# Patient Record
Sex: Male | Born: 1940 | Race: Asian | Hispanic: No | Marital: Married | Smoking: Never smoker
Health system: Southern US, Community
[De-identification: ages and names within clinical notes are randomized; demographics above are authoritative.]

## PROBLEM LIST (undated history)

## (undated) DIAGNOSIS — J189 Pneumonia, unspecified organism: Secondary | ICD-10-CM

## (undated) DIAGNOSIS — E119 Type 2 diabetes mellitus without complications: Secondary | ICD-10-CM

## (undated) DIAGNOSIS — I2510 Atherosclerotic heart disease of native coronary artery without angina pectoris: Secondary | ICD-10-CM

## (undated) DIAGNOSIS — Z86010 Personal history of colonic polyps: Secondary | ICD-10-CM

## (undated) DIAGNOSIS — E785 Hyperlipidemia, unspecified: Secondary | ICD-10-CM

## (undated) DIAGNOSIS — I10 Essential (primary) hypertension: Secondary | ICD-10-CM

## (undated) DIAGNOSIS — Z91041 Radiographic dye allergy status: Secondary | ICD-10-CM

## (undated) DIAGNOSIS — K219 Gastro-esophageal reflux disease without esophagitis: Secondary | ICD-10-CM

## (undated) HISTORY — DX: Gastro-esophageal reflux disease without esophagitis: K21.9

## (undated) HISTORY — PX: CORONARY ANGIOPLASTY: SHX604

## (undated) HISTORY — PX: SHOULDER OPEN ROTATOR CUFF REPAIR: SHX2407

## (undated) HISTORY — DX: Essential (primary) hypertension: I10

## (undated) HISTORY — PX: SHOULDER ARTHROSCOPY W/ ROTATOR CUFF REPAIR: SHX2400

## (undated) HISTORY — DX: Personal history of colonic polyps: Z86.010

## (undated) HISTORY — DX: Hyperlipidemia, unspecified: E78.5

---

## 1999-07-28 ENCOUNTER — Encounter: Payer: Self-pay | Admitting: Family Medicine

## 1999-07-28 ENCOUNTER — Ambulatory Visit (HOSPITAL_COMMUNITY): Admission: RE | Admit: 1999-07-28 | Discharge: 1999-07-28 | Payer: Self-pay | Admitting: Gastroenterology

## 2000-12-18 ENCOUNTER — Encounter: Payer: Self-pay | Admitting: Family Medicine

## 2000-12-18 ENCOUNTER — Encounter: Admission: RE | Admit: 2000-12-18 | Discharge: 2000-12-18 | Payer: Self-pay | Admitting: Family Medicine

## 2002-05-22 ENCOUNTER — Emergency Department (HOSPITAL_COMMUNITY): Admission: EM | Admit: 2002-05-22 | Discharge: 2002-05-22 | Payer: Self-pay | Admitting: Emergency Medicine

## 2003-01-30 HISTORY — PX: CATARACT EXTRACTION W/ INTRAOCULAR LENS  IMPLANT, BILATERAL: SHX1307

## 2003-02-24 ENCOUNTER — Emergency Department (HOSPITAL_COMMUNITY): Admission: EM | Admit: 2003-02-24 | Discharge: 2003-02-24 | Payer: Self-pay | Admitting: Emergency Medicine

## 2005-08-22 ENCOUNTER — Ambulatory Visit: Payer: Self-pay | Admitting: Family Medicine

## 2005-10-19 ENCOUNTER — Ambulatory Visit: Payer: Self-pay | Admitting: Family Medicine

## 2006-04-15 DIAGNOSIS — IMO0001 Reserved for inherently not codable concepts without codable children: Secondary | ICD-10-CM | POA: Insufficient documentation

## 2006-04-15 DIAGNOSIS — E1165 Type 2 diabetes mellitus with hyperglycemia: Secondary | ICD-10-CM

## 2006-04-15 DIAGNOSIS — E785 Hyperlipidemia, unspecified: Secondary | ICD-10-CM | POA: Insufficient documentation

## 2006-04-15 DIAGNOSIS — I10 Essential (primary) hypertension: Secondary | ICD-10-CM | POA: Insufficient documentation

## 2006-04-15 DIAGNOSIS — K219 Gastro-esophageal reflux disease without esophagitis: Secondary | ICD-10-CM | POA: Insufficient documentation

## 2006-05-22 ENCOUNTER — Ambulatory Visit: Payer: Self-pay | Admitting: Family Medicine

## 2006-05-22 LAB — CONVERTED CEMR LAB
BUN: 12 mg/dL (ref 6–23)
CO2: 30 meq/L (ref 19–32)
Calcium: 9.5 mg/dL (ref 8.4–10.5)
Chloride: 105 meq/L (ref 96–112)
Creatinine, Ser: 0.7 mg/dL (ref 0.4–1.5)
GFR calc Af Amer: 146 mL/min
GFR calc non Af Amer: 121 mL/min
Glucose, Bld: 92 mg/dL (ref 70–99)
Potassium: 4.1 meq/L (ref 3.5–5.1)
Sodium: 139 meq/L (ref 135–145)

## 2006-07-29 ENCOUNTER — Ambulatory Visit: Payer: Self-pay | Admitting: Family Medicine

## 2006-07-30 LAB — CONVERTED CEMR LAB
ALT: 20 units/L (ref 0–53)
AST: 22 units/L (ref 0–37)
BUN: 11 mg/dL (ref 6–23)
CO2: 29 meq/L (ref 19–32)
Calcium: 9.4 mg/dL (ref 8.4–10.5)
Chloride: 105 meq/L (ref 96–112)
Cholesterol: 98 mg/dL (ref 0–200)
Creatinine, Ser: 0.8 mg/dL (ref 0.4–1.5)
Creatinine,U: 210.5 mg/dL
GFR calc Af Amer: 125 mL/min
GFR calc non Af Amer: 103 mL/min
Glucose, Bld: 103 mg/dL — ABNORMAL HIGH (ref 70–99)
HDL: 25.7 mg/dL — ABNORMAL LOW (ref >39.0)
Hgb A1c MFr Bld: 6.2 % — ABNORMAL HIGH (ref 4.6–6.0)
LDL Cholesterol: 55 mg/dL (ref 0–99)
Microalb Creat Ratio: 10 mg/g (ref 0.0–30.0)
Microalb, Ur: 2.1 mg/dL — ABNORMAL HIGH (ref 0.0–1.9)
Potassium: 4.7 meq/L (ref 3.5–5.1)
Sodium: 140 meq/L (ref 135–145)
Total CHOL/HDL Ratio: 3.8
Triglycerides: 85 mg/dL (ref 0–149)
VLDL: 17 mg/dL (ref 0–40)

## 2006-07-31 ENCOUNTER — Encounter (INDEPENDENT_AMBULATORY_CARE_PROVIDER_SITE_OTHER): Payer: Self-pay | Admitting: Family Medicine

## 2006-07-31 ENCOUNTER — Encounter (INDEPENDENT_AMBULATORY_CARE_PROVIDER_SITE_OTHER): Payer: Self-pay | Admitting: *Deleted

## 2006-09-26 ENCOUNTER — Encounter (INDEPENDENT_AMBULATORY_CARE_PROVIDER_SITE_OTHER): Payer: Self-pay | Admitting: Family Medicine

## 2006-10-15 ENCOUNTER — Telehealth (INDEPENDENT_AMBULATORY_CARE_PROVIDER_SITE_OTHER): Payer: Self-pay | Admitting: *Deleted

## 2006-10-24 ENCOUNTER — Ambulatory Visit: Payer: Self-pay | Admitting: Family Medicine

## 2006-10-28 ENCOUNTER — Telehealth (INDEPENDENT_AMBULATORY_CARE_PROVIDER_SITE_OTHER): Payer: Self-pay | Admitting: *Deleted

## 2006-10-28 ENCOUNTER — Encounter (INDEPENDENT_AMBULATORY_CARE_PROVIDER_SITE_OTHER): Payer: Self-pay | Admitting: Family Medicine

## 2006-10-28 LAB — CONVERTED CEMR LAB
ALT: 24 units/L (ref 0–53)
AST: 26 units/L (ref 0–37)
BUN: 10 mg/dL (ref 6–23)
CO2: 30 meq/L (ref 19–32)
Calcium: 9.5 mg/dL (ref 8.4–10.5)
Chloride: 102 meq/L (ref 96–112)
Cholesterol: 99 mg/dL (ref 0–200)
Creatinine, Ser: 0.8 mg/dL (ref 0.4–1.5)
Creatinine,U: 211.8 mg/dL
GFR calc Af Amer: 125 mL/min
GFR calc non Af Amer: 103 mL/min
Glucose, Bld: 98 mg/dL (ref 70–99)
HDL: 28.7 mg/dL — ABNORMAL LOW (ref >39.0)
Hgb A1c MFr Bld: 6.3 % — ABNORMAL HIGH (ref 4.6–6.0)
LDL Cholesterol: 55 mg/dL (ref 0–99)
Microalb Creat Ratio: 4.7 mg/g (ref 0.0–30.0)
Microalb, Ur: 1 mg/dL (ref 0.0–1.9)
Potassium: 4.3 meq/L (ref 3.5–5.1)
Sodium: 138 meq/L (ref 135–145)
Total CHOL/HDL Ratio: 3.4
Triglycerides: 77 mg/dL (ref 0–149)
VLDL: 15 mg/dL (ref 0–40)

## 2006-10-29 ENCOUNTER — Encounter: Payer: Self-pay | Admitting: Family Medicine

## 2006-10-30 ENCOUNTER — Telehealth (INDEPENDENT_AMBULATORY_CARE_PROVIDER_SITE_OTHER): Payer: Self-pay | Admitting: *Deleted

## 2007-04-08 ENCOUNTER — Telehealth (INDEPENDENT_AMBULATORY_CARE_PROVIDER_SITE_OTHER): Payer: Self-pay | Admitting: Family Medicine

## 2007-04-22 ENCOUNTER — Encounter (INDEPENDENT_AMBULATORY_CARE_PROVIDER_SITE_OTHER): Payer: Self-pay | Admitting: Family Medicine

## 2007-05-13 ENCOUNTER — Ambulatory Visit: Payer: Self-pay | Admitting: Family Medicine

## 2007-05-13 LAB — CONVERTED CEMR LAB
ALT: 28 units/L (ref 0–53)
AST: 24 units/L (ref 0–37)
Albumin: 4.1 g/dL (ref 3.5–5.2)
Alkaline Phosphatase: 52 units/L (ref 39–117)
BUN: 8 mg/dL (ref 6–23)
Bilirubin, Direct: 0.1 mg/dL (ref 0.0–0.3)
CO2: 29 meq/L (ref 19–32)
Calcium: 9.6 mg/dL (ref 8.4–10.5)
Chloride: 104 meq/L (ref 96–112)
Cholesterol: 88 mg/dL (ref 0–200)
Creatinine, Ser: 0.8 mg/dL (ref 0.4–1.5)
Creatinine,U: 121.7 mg/dL
GFR calc Af Amer: 125 mL/min
GFR calc non Af Amer: 103 mL/min
Glucose, Bld: 131 mg/dL — ABNORMAL HIGH (ref 70–99)
HDL: 22.7 mg/dL — ABNORMAL LOW (ref >39.0)
Hgb A1c MFr Bld: 6.6 % — ABNORMAL HIGH (ref 4.6–6.0)
LDL Cholesterol: 49 mg/dL (ref 0–99)
Microalb Creat Ratio: 8.2 mg/g (ref 0.0–30.0)
Microalb, Ur: 1 mg/dL (ref 0.0–1.9)
Potassium: 4.7 meq/L (ref 3.5–5.1)
Sodium: 139 meq/L (ref 135–145)
Total Bilirubin: 0.7 mg/dL (ref 0.3–1.2)
Total CHOL/HDL Ratio: 3.9
Total Protein: 7.4 g/dL (ref 6.0–8.3)
Triglycerides: 83 mg/dL (ref 0–149)
VLDL: 17 mg/dL (ref 0–40)

## 2007-05-14 ENCOUNTER — Encounter (INDEPENDENT_AMBULATORY_CARE_PROVIDER_SITE_OTHER): Payer: Self-pay | Admitting: *Deleted

## 2007-05-15 ENCOUNTER — Telehealth (INDEPENDENT_AMBULATORY_CARE_PROVIDER_SITE_OTHER): Payer: Self-pay | Admitting: *Deleted

## 2007-06-03 ENCOUNTER — Ambulatory Visit: Payer: Self-pay | Admitting: Endocrinology

## 2007-06-03 ENCOUNTER — Telehealth (INDEPENDENT_AMBULATORY_CARE_PROVIDER_SITE_OTHER): Payer: Self-pay | Admitting: *Deleted

## 2007-06-03 DIAGNOSIS — J069 Acute upper respiratory infection, unspecified: Secondary | ICD-10-CM | POA: Insufficient documentation

## 2007-06-24 ENCOUNTER — Encounter: Payer: Self-pay | Admitting: Internal Medicine

## 2007-09-16 ENCOUNTER — Ambulatory Visit: Payer: Self-pay | Admitting: Family Medicine

## 2007-09-16 LAB — CONVERTED CEMR LAB
Bilirubin Urine: NEGATIVE
Glucose, Urine, Semiquant: NEGATIVE
Ketones, urine, test strip: NEGATIVE
Nitrite: NEGATIVE
Protein, U semiquant: NEGATIVE
Specific Gravity, Urine: >=1.03
Urobilinogen, UA: NEGATIVE
WBC Urine, dipstick: NEGATIVE
pH: 5

## 2007-09-18 ENCOUNTER — Encounter (INDEPENDENT_AMBULATORY_CARE_PROVIDER_SITE_OTHER): Payer: Self-pay | Admitting: *Deleted

## 2007-09-28 LAB — CONVERTED CEMR LAB
ALT: 25 units/L (ref 0–53)
AST: 24 units/L (ref 0–37)
Albumin: 4 g/dL (ref 3.5–5.2)
Alkaline Phosphatase: 50 units/L (ref 39–117)
BUN: 12 mg/dL (ref 6–23)
Bilirubin, Direct: 0.1 mg/dL (ref 0.0–0.3)
CO2: 29 meq/L (ref 19–32)
Calcium: 9.8 mg/dL (ref 8.4–10.5)
Chloride: 101 meq/L (ref 96–112)
Cholesterol: 109 mg/dL (ref 0–200)
Creatinine, Ser: 0.9 mg/dL (ref 0.4–1.5)
Creatinine,U: 270.8 mg/dL
GFR calc Af Amer: 109 mL/min
GFR calc non Af Amer: 90 mL/min
Glucose, Bld: 110 mg/dL — ABNORMAL HIGH (ref 70–99)
HDL: 27 mg/dL — ABNORMAL LOW (ref >39.0)
Hgb A1c MFr Bld: 6.6 % — ABNORMAL HIGH (ref 4.6–6.0)
LDL Cholesterol: 62 mg/dL (ref 0–99)
Microalb Creat Ratio: 5.2 mg/g (ref 0.0–30.0)
Microalb, Ur: 1.4 mg/dL (ref 0.0–1.9)
PSA: 4.09 ng/mL — ABNORMAL HIGH (ref 0.10–4.00)
Potassium: 4.7 meq/L (ref 3.5–5.1)
Sodium: 137 meq/L (ref 135–145)
Total Bilirubin: 0.8 mg/dL (ref 0.3–1.2)
Total CHOL/HDL Ratio: 4
Total Protein: 7.1 g/dL (ref 6.0–8.3)
Triglycerides: 102 mg/dL (ref 0–149)
VLDL: 20 mg/dL (ref 0–40)

## 2007-09-29 ENCOUNTER — Telehealth (INDEPENDENT_AMBULATORY_CARE_PROVIDER_SITE_OTHER): Payer: Self-pay | Admitting: *Deleted

## 2007-09-30 ENCOUNTER — Encounter (INDEPENDENT_AMBULATORY_CARE_PROVIDER_SITE_OTHER): Payer: Self-pay | Admitting: *Deleted

## 2007-10-10 ENCOUNTER — Encounter: Payer: Self-pay | Admitting: Family Medicine

## 2007-11-10 ENCOUNTER — Telehealth (INDEPENDENT_AMBULATORY_CARE_PROVIDER_SITE_OTHER): Payer: Self-pay | Admitting: *Deleted

## 2007-11-13 ENCOUNTER — Encounter: Payer: Self-pay | Admitting: Family Medicine

## 2008-04-07 LAB — HM DIABETES EYE EXAM: HM Diabetic Eye Exam: NORMAL

## 2008-04-09 ENCOUNTER — Ambulatory Visit: Payer: Self-pay | Admitting: Family Medicine

## 2008-04-26 ENCOUNTER — Encounter (INDEPENDENT_AMBULATORY_CARE_PROVIDER_SITE_OTHER): Payer: Self-pay | Admitting: *Deleted

## 2008-04-26 LAB — CONVERTED CEMR LAB
ALT: 25 units/L (ref 0–53)
AST: 22 units/L (ref 0–37)
Albumin: 3.9 g/dL (ref 3.5–5.2)
Alkaline Phosphatase: 49 units/L (ref 39–117)
BUN: 12 mg/dL (ref 6–23)
Basophils Absolute: 0 10*3/uL (ref 0.0–0.1)
Basophils Relative: 0.1 % (ref 0.0–3.0)
Bilirubin, Direct: 0.1 mg/dL (ref 0.0–0.3)
CO2: 28 meq/L (ref 19–32)
Calcium: 9.1 mg/dL (ref 8.4–10.5)
Chloride: 102 meq/L (ref 96–112)
Cholesterol: 103 mg/dL (ref 0–200)
Creatinine, Ser: 0.8 mg/dL (ref 0.4–1.5)
Creatinine,U: 200 mg/dL
Eosinophils Absolute: 0.3 10*3/uL (ref 0.0–0.7)
Eosinophils Relative: 3.1 % (ref 0.0–5.0)
GFR calc Af Amer: 125 mL/min
GFR calc non Af Amer: 103 mL/min
Glucose, Bld: 109 mg/dL — ABNORMAL HIGH (ref 70–99)
HCT: 39.1 % (ref 39.0–52.0)
HDL: 24.5 mg/dL — ABNORMAL LOW (ref >39.0)
Hemoglobin: 13.2 g/dL (ref 13.0–17.0)
Hgb A1c MFr Bld: 6.7 % — ABNORMAL HIGH (ref 4.6–6.0)
LDL Cholesterol: 61 mg/dL (ref 0–99)
Lymphocytes Relative: 34.4 % (ref 12.0–46.0)
MCHC: 33.8 g/dL (ref 30.0–36.0)
MCV: 87.8 fL (ref 78.0–100.0)
Microalb Creat Ratio: 6.5 mg/g (ref 0.0–30.0)
Microalb, Ur: 1.3 mg/dL (ref 0.0–1.9)
Monocytes Absolute: 0.7 10*3/uL (ref 0.1–1.0)
Monocytes Relative: 7.9 % (ref 3.0–12.0)
Neutro Abs: 4.5 10*3/uL (ref 1.4–7.7)
Neutrophils Relative %: 54.5 % (ref 43.0–77.0)
Platelets: 178 10*3/uL (ref 150–400)
Potassium: 4.6 meq/L (ref 3.5–5.1)
RBC: 4.46 M/uL (ref 4.22–5.81)
RDW: 12.5 % (ref 11.5–14.6)
Sodium: 138 meq/L (ref 135–145)
Total Bilirubin: 0.8 mg/dL (ref 0.3–1.2)
Total CHOL/HDL Ratio: 4.2
Total Protein: 6.9 g/dL (ref 6.0–8.3)
Triglycerides: 90 mg/dL (ref 0–149)
VLDL: 18 mg/dL (ref 0–40)
WBC: 8.4 10*3/uL (ref 4.5–10.5)

## 2008-10-01 ENCOUNTER — Telehealth (INDEPENDENT_AMBULATORY_CARE_PROVIDER_SITE_OTHER): Payer: Self-pay | Admitting: *Deleted

## 2008-11-12 ENCOUNTER — Ambulatory Visit: Payer: Self-pay | Admitting: Family Medicine

## 2008-11-12 ENCOUNTER — Telehealth: Payer: Self-pay | Admitting: Family Medicine

## 2008-11-12 LAB — CONVERTED CEMR LAB
Albumin: 4 g/dL (ref 3.5–5.2)
Bilirubin Urine: NEGATIVE
CO2: 27 meq/L (ref 19–32)
Calcium: 9.3 mg/dL (ref 8.4–10.5)
Chloride: 99 meq/L (ref 96–112)
Glucose, Urine, Semiquant: NEGATIVE
HDL: 23.6 mg/dL — ABNORMAL LOW (ref >39.00)
Ketones, urine, test strip: NEGATIVE
LDL Cholesterol: 61 mg/dL (ref 0–99)
Nitrite: NEGATIVE
Protein, U semiquant: NEGATIVE
Sodium: 133 meq/L — ABNORMAL LOW (ref 135–145)
Specific Gravity, Urine: 1.02
Total CHOL/HDL Ratio: 5
Triglycerides: 142 mg/dL (ref 0.0–149.0)
Urobilinogen, UA: 0.2
WBC Urine, dipstick: NEGATIVE
pH: 6

## 2008-11-13 ENCOUNTER — Encounter: Payer: Self-pay | Admitting: Family Medicine

## 2008-11-15 ENCOUNTER — Encounter: Payer: Self-pay | Admitting: Family Medicine

## 2008-11-18 ENCOUNTER — Encounter: Payer: Self-pay | Admitting: Family Medicine

## 2009-01-19 ENCOUNTER — Telehealth: Payer: Self-pay | Admitting: Family Medicine

## 2009-01-27 ENCOUNTER — Ambulatory Visit: Payer: Self-pay | Admitting: Family Medicine

## 2009-01-27 DIAGNOSIS — R059 Cough, unspecified: Secondary | ICD-10-CM | POA: Insufficient documentation

## 2009-01-27 DIAGNOSIS — J019 Acute sinusitis, unspecified: Secondary | ICD-10-CM | POA: Insufficient documentation

## 2009-01-27 DIAGNOSIS — J029 Acute pharyngitis, unspecified: Secondary | ICD-10-CM | POA: Insufficient documentation

## 2009-01-27 DIAGNOSIS — R05 Cough: Secondary | ICD-10-CM | POA: Insufficient documentation

## 2009-01-31 ENCOUNTER — Encounter (INDEPENDENT_AMBULATORY_CARE_PROVIDER_SITE_OTHER): Payer: Self-pay | Admitting: *Deleted

## 2009-01-31 ENCOUNTER — Telehealth (INDEPENDENT_AMBULATORY_CARE_PROVIDER_SITE_OTHER): Payer: Self-pay | Admitting: *Deleted

## 2009-01-31 LAB — CONVERTED CEMR LAB
Basophils Relative: 0.3 % (ref 0.0–3.0)
Bilirubin, Direct: 0.2 mg/dL (ref 0.0–0.3)
Chloride: 100 meq/L (ref 96–112)
Creatinine, Ser: 0.7 mg/dL (ref 0.4–1.5)
Creatinine,U: 115.1 mg/dL
Eosinophils Relative: 3.9 % (ref 0.0–5.0)
GFR calc non Af Amer: 119.66 mL/min (ref >60)
Hgb A1c MFr Bld: 7.1 % — ABNORMAL HIGH (ref 4.6–6.5)
LDL Cholesterol: 55 mg/dL (ref 0–99)
Lymphocytes Relative: 28.8 % (ref 12.0–46.0)
MCV: 86.6 fL (ref 78.0–100.0)
Microalb Creat Ratio: 5.2 mg/g (ref 0.0–30.0)
Microalb, Ur: 0.6 mg/dL (ref 0.0–1.9)
Monocytes Absolute: 0.8 10*3/uL (ref 0.1–1.0)
Monocytes Relative: 6.6 % (ref 3.0–12.0)
Neutrophils Relative %: 60.4 % (ref 43.0–77.0)
RBC: 4.74 M/uL (ref 4.22–5.81)
Total Bilirubin: 1 mg/dL (ref 0.3–1.2)
Total CHOL/HDL Ratio: 4
Triglycerides: 127 mg/dL (ref 0.0–149.0)
VLDL: 25.4 mg/dL (ref 0.0–40.0)
WBC: 12.4 10*3/uL — ABNORMAL HIGH (ref 4.5–10.5)

## 2009-02-07 ENCOUNTER — Telehealth: Payer: Self-pay | Admitting: Family Medicine

## 2009-02-09 ENCOUNTER — Telehealth: Payer: Self-pay | Admitting: Family Medicine

## 2009-03-08 ENCOUNTER — Telehealth: Payer: Self-pay | Admitting: Family Medicine

## 2009-03-14 ENCOUNTER — Telehealth (INDEPENDENT_AMBULATORY_CARE_PROVIDER_SITE_OTHER): Payer: Self-pay | Admitting: *Deleted

## 2009-03-18 ENCOUNTER — Ambulatory Visit: Payer: Self-pay | Admitting: Family Medicine

## 2009-03-18 ENCOUNTER — Encounter: Payer: Self-pay | Admitting: Family Medicine

## 2009-03-18 DIAGNOSIS — R972 Elevated prostate specific antigen [PSA]: Secondary | ICD-10-CM | POA: Insufficient documentation

## 2009-03-21 LAB — CONVERTED CEMR LAB
ALT: 26 units/L (ref 0–53)
AST: 23 units/L (ref 0–37)
Albumin: 4 g/dL (ref 3.5–5.2)
BUN: 12 mg/dL (ref 6–23)
Cholesterol: 97 mg/dL (ref 0–200)
Creatinine, Ser: 0.7 mg/dL (ref 0.4–1.5)
Creatinine,U: 342.3 mg/dL
GFR calc non Af Amer: 119.61 mL/min (ref >60)
Glucose, Bld: 122 mg/dL — ABNORMAL HIGH (ref 70–99)
HDL: 30.3 mg/dL — ABNORMAL LOW (ref >39.00)
LDL Cholesterol: 49 mg/dL (ref 0–99)
Microalb, Ur: 4.1 mg/dL — ABNORMAL HIGH (ref 0.0–1.9)
Total Bilirubin: 0.5 mg/dL (ref 0.3–1.2)
Total CHOL/HDL Ratio: 3
Triglycerides: 89 mg/dL (ref 0.0–149.0)

## 2009-03-30 ENCOUNTER — Telehealth: Payer: Self-pay | Admitting: Family Medicine

## 2009-03-30 ENCOUNTER — Encounter: Payer: Self-pay | Admitting: Family Medicine

## 2009-04-04 ENCOUNTER — Encounter: Payer: Self-pay | Admitting: Family Medicine

## 2009-04-05 ENCOUNTER — Encounter: Payer: Self-pay | Admitting: Family Medicine

## 2009-04-05 ENCOUNTER — Telehealth: Payer: Self-pay | Admitting: Family Medicine

## 2009-04-11 ENCOUNTER — Telehealth: Payer: Self-pay | Admitting: Family Medicine

## 2009-04-13 ENCOUNTER — Ambulatory Visit: Payer: Self-pay | Admitting: Family Medicine

## 2009-04-13 DIAGNOSIS — J189 Pneumonia, unspecified organism: Secondary | ICD-10-CM | POA: Insufficient documentation

## 2009-04-13 DIAGNOSIS — R930 Abnormal findings on diagnostic imaging of skull and head, not elsewhere classified: Secondary | ICD-10-CM | POA: Insufficient documentation

## 2009-04-22 ENCOUNTER — Ambulatory Visit: Payer: Self-pay | Admitting: Family Medicine

## 2009-04-26 ENCOUNTER — Telehealth (INDEPENDENT_AMBULATORY_CARE_PROVIDER_SITE_OTHER): Payer: Self-pay | Admitting: *Deleted

## 2009-04-27 ENCOUNTER — Encounter: Payer: Self-pay | Admitting: Family Medicine

## 2009-05-13 ENCOUNTER — Encounter: Payer: Self-pay | Admitting: Family Medicine

## 2009-05-18 ENCOUNTER — Encounter: Payer: Self-pay | Admitting: Family Medicine

## 2009-07-25 ENCOUNTER — Ambulatory Visit: Payer: Self-pay | Admitting: Internal Medicine

## 2009-07-25 DIAGNOSIS — L259 Unspecified contact dermatitis, unspecified cause: Secondary | ICD-10-CM | POA: Insufficient documentation

## 2009-07-28 LAB — CONVERTED CEMR LAB
ALT: 17 units/L (ref 0–53)
AST: 21 units/L (ref 0–37)
Bilirubin, Direct: 0.2 mg/dL (ref 0.0–0.3)
CO2: 29 meq/L (ref 19–32)
Chloride: 103 meq/L (ref 96–112)
Cholesterol: 97 mg/dL (ref 0–200)
Creatinine, Ser: 0.8 mg/dL (ref 0.4–1.5)
LDL Cholesterol: 50 mg/dL (ref 0–99)
Microalb, Ur: 2.8 mg/dL — ABNORMAL HIGH (ref 0.0–1.9)
Potassium: 4.5 meq/L (ref 3.5–5.1)
Total Bilirubin: 0.9 mg/dL (ref 0.3–1.2)
Total CHOL/HDL Ratio: 3
Total Protein: 7.3 g/dL (ref 6.0–8.3)
Triglycerides: 86 mg/dL (ref 0.0–149.0)

## 2009-08-01 ENCOUNTER — Encounter: Payer: Self-pay | Admitting: Family Medicine

## 2009-08-02 ENCOUNTER — Encounter: Payer: Self-pay | Admitting: Family Medicine

## 2009-08-03 ENCOUNTER — Telehealth: Payer: Self-pay | Admitting: Family Medicine

## 2009-08-04 LAB — CONVERTED CEMR LAB

## 2009-09-12 ENCOUNTER — Encounter: Payer: Self-pay | Admitting: Family Medicine

## 2009-11-18 ENCOUNTER — Ambulatory Visit: Payer: Self-pay | Admitting: Family Medicine

## 2009-11-18 ENCOUNTER — Encounter: Payer: Self-pay | Admitting: Family Medicine

## 2009-11-18 LAB — CONVERTED CEMR LAB
Bilirubin Urine: NEGATIVE
Glucose, Urine, Semiquant: NEGATIVE
Ketones, urine, test strip: NEGATIVE
Protein, U semiquant: NEGATIVE
Specific Gravity, Urine: 1.02
pH: 5

## 2009-11-19 ENCOUNTER — Encounter: Payer: Self-pay | Admitting: Family Medicine

## 2009-11-24 LAB — CONVERTED CEMR LAB
Albumin: 4.4 g/dL (ref 3.5–5.2)
Alkaline Phosphatase: 48 units/L (ref 39–117)
BUN: 14 mg/dL (ref 6–23)
Basophils Absolute: 0 10*3/uL (ref 0.0–0.1)
CO2: 30 meq/L (ref 19–32)
Calcium: 9.2 mg/dL (ref 8.4–10.5)
Cholesterol: 105 mg/dL (ref 0–200)
Creatinine, Ser: 0.7 mg/dL (ref 0.4–1.5)
Creatinine,U: 166.8 mg/dL
Eosinophils Absolute: 0.3 10*3/uL (ref 0.0–0.7)
GFR calc non Af Amer: 115.55 mL/min (ref >60)
Glucose, Bld: 108 mg/dL — ABNORMAL HIGH (ref 70–99)
HDL: 27.2 mg/dL — ABNORMAL LOW (ref >39.00)
Hemoglobin: 14.2 g/dL (ref 13.0–17.0)
Lymphocytes Relative: 27.7 % (ref 12.0–46.0)
Lymphs Abs: 2.7 10*3/uL (ref 0.7–4.0)
MCHC: 34.1 g/dL (ref 30.0–36.0)
Microalb Creat Ratio: 2.3 mg/g (ref 0.0–30.0)
Microalb, Ur: 3.8 mg/dL — ABNORMAL HIGH (ref 0.0–1.9)
Neutro Abs: 6.1 10*3/uL (ref 1.4–7.7)
PSA: 4.11 ng/mL — ABNORMAL HIGH (ref 0.10–4.00)
Platelets: 163 10*3/uL (ref 150.0–400.0)
RDW: 14.2 % (ref 11.5–14.6)
Sodium: 137 meq/L (ref 135–145)
Total Bilirubin: 0.8 mg/dL (ref 0.3–1.2)
Triglycerides: 148 mg/dL (ref 0.0–149.0)
VLDL: 29.6 mg/dL (ref 0.0–40.0)

## 2009-11-30 ENCOUNTER — Encounter: Payer: Self-pay | Admitting: Family Medicine

## 2010-03-02 NOTE — Procedures (Signed)
Summary: Colonoscopy/[facility]  Colonoscopy/[facility]   Imported By: Lanelle Bal 11/28/2009 13:13:57  _____________________________________________________________________  External Attachment:    Type:   Image     Comment:   External Document

## 2010-03-02 NOTE — Progress Notes (Signed)
Summary: BS sugar  Phone Note Call from Patient Call back at [phone]   Caller: Patient Summary of Call: Pt called and stated that he is feeling better, he was dischaged. His BS is now high. Last night it was 292, he took 2mg  Amarly, this a.m. it was 112. He checked it at 11 a.m. 222, the Doctos at [facility] told him to take the Amarly 1 tab two times a day. They told him to contact him PCP.  He is [place]. Army Fossa CMA  April 05, 2009 11:34 AM   Follow-up for Phone Call        Pt will need OV when he is back here---- take med as they rx Follow-up by: Loreen Freud DO,  April 05, 2009 11:46 AM  Additional Follow-up for Phone Call Additional follow up Details #1::        Pt is aware, he will make an appt.Army Fossa CMA  April 05, 2009 11:52 AM     New/Updated Medications: AMARYL 2 MG TABS (GLIMEPIRIDE) 1 by mouth two times a day before meals.

## 2010-03-02 NOTE — Progress Notes (Signed)
Summary: med/lab clariication  Phone Note Call from Patient   Caller: Patient Summary of Call: pt left VM that he would like call back...............Marland KitchenFelecia Deloach CMA  January 31, 2009 11:59 AM   called pt back he needs some clarification on labs results and new med that was rx and to correct mailing address.............Marland KitchenFelecia Deloach CMA  January 31, 2009 12:04 PM

## 2010-03-02 NOTE — Medication Information (Signed)
Summary: Fax Regarding ACE I or ARB Therapy/[facility]  Fax Regarding ACE I or ARB Therapy/[facility]   Imported By: Lanelle Bal 08/09/2009 11:59:16  _____________________________________________________________________  External Attachment:    Type:   Image     Comment:   External Document

## 2010-03-02 NOTE — Progress Notes (Signed)
Summary: Unable to get samples/not in the state  Phone Note Call from Patient   Summary of Call: Pt is not in the state at the moment so he cannot get samples of the Janumet- Is there another opiton for him until we get the Patient Assistance program taken care of?  Initial call taken by: Army Fossa CMA,  February 09, 2009 10:29 AM  Follow-up for Phone Call        does he have any janumet?  We can mail him the patient assistance form. Follow-up by: Loreen Freud DO,  February 09, 2009 11:02 AM  Additional Follow-up for Phone Call Additional follow up Details #1::        Pt states he will get the medication for 1 month and we will send the patient assistance form to fill out. Army Fossa CMA  February 09, 2009 2:29 PM

## 2010-03-02 NOTE — Assessment & Plan Note (Signed)
Summary: CPX//PH   Vital Signs:  Patient profile:   70 year old male Height:      65 inches Weight:      157.2 pounds BMI:     26.25 Temp:     96.6 degrees F oral Pulse rate:   60 / minute Pulse rhythm:   regular BP sitting:   122 / 80  (left arm) Cuff size:   regular  Vitals Entered By: Almeta Monas CMA Duncan Dull) (November 18, 2009 9:42 AM) CC: cpx/fasting  Does patient need assistance? Functional Status Self care, Cook/clean, Shopping, Social activities Ambulation Normal Comments pt able to do all adls and can read and write  Vision Screening:      Vision Comments: optho--in [place] 40db HL: Left  Right  Audiometry Comment: groslly normal    History of Present Illness: Pt here for cpe.  Type 1 diabetes mellitus follow-up      This is a 70 year old man who presents with Type 2 diabetes mellitus follow-up.  The patient denies polyuria, polydipsia, blurred vision, self managed hypoglycemia, hypoglycemia requiring help, weight loss, weight gain, and numbness of extremities.  The patient denies the following symptoms: neuropathic pain, chest pain, vomiting, orthostatic symptoms, poor wound healing, intermittent claudication, vision loss, and foot ulcer.  Since the last visit the patient reports good dietary compliance, compliance with medications, exercising regularly, and monitoring blood glucose.  The patient has been measuring capillary blood glucose before breakfast.  Since the last visit, the patient reports having had eye care by an ophthalmologist.  Pt will see eye dr in [place].    Hypertension follow-up      The patient also presents for Hypertension follow-up.  The patient denies lightheadedness, urinary frequency, headaches, edema, impotence, rash, and fatigue.  The patient denies the following associated symptoms: chest pain, chest pressure, exercise intolerance, dyspnea, palpitations, syncope, leg edema, and pedal edema.  Compliance with medications (by patient report) has  been near 100%.  The patient reports that dietary compliance has been good.  The patient reports exercising daily.  Adjunctive measures currently used by the patient include salt restriction.    Hyperlipidemia follow-up      The patient also presents for Hyperlipidemia follow-up.  The patient denies muscle aches, GI upset, abdominal pain, flushing, itching, constipation, diarrhea, and fatigue.  The patient denies the following symptoms: chest pain/pressure, exercise intolerance, dypsnea, palpitations, syncope, and pedal edema.  Compliance with medications (by patient report) has been near 100%.  Dietary compliance has been good.  The patient reports exercising daily.  Adjunctive measures currently used by the patient include ASA.    Preventive Screening-Counseling & Management  Alcohol-Tobacco     Alcohol drinks/day: 0     Smoking Status: never     Year Started: 1950     Cans of tobacco/week: 1/4  Caffeine-Diet-Exercise     Caffeine use/day: 1     Does Patient Exercise: yes     Type of exercise: walking     Exercise (avg: min/session): 30-60  Hep-HIV-STD-Contraception     Dental Visit-last 6 months no     Dental Care Counseling: to seek dental care; no dental care within six months  Safety-Violence-Falls     Firearms in the Home: no firearms in the home     Smoke Detectors: yes     Violence in the Home: no risk noted     Sexual Abuse: no     Fall Risk: no      Drug  Use:  no.    Current Medications (verified): 1)  Carvedilol 6.25 Mg Tabs (Carvedilol) .Marland Kitchen.. 1 By Mouth Two Times A Day 2)  Crestor 10 Mg Tabs (Rosuvastatin Calcium) .Marland Kitchen.. 1 By Mouth Daily 3)  Baby Aspirin 81 Mg  Chew (Aspirin) .... Take One Tablet Daily 4)  Bayer Contour Test  Strp (Glucose Blood) .... Bs  Two Times A Day 5)  Fish Oil Concentrate 120-180 Mg  Caps (Omega-3 Fatty Acids) 6)  Metformin Hcl 500 Mg Xr24h-Tab (Metformin Hcl) .... Take 2 By Mouth Once Daily 7)  Centrum Silver Ultra Mens  Tabs (Multiple  Vitamins-Minerals) .... By Mouth Once Daily 8)  Zostavax 98119 Unt/0.31ml Solr (Zoster Vaccine Live) .Marland Kitchen.. 1 Ml Im X1  Allergies (verified): No Known Drug Allergies  Past History:  Past Medical History: Last updated: 04/15/2006 Diabetes mellitus, type II GERD Hyperlipidemia Hypertension  Risk Factors: Alcohol Use: 0 (11/18/2009) Caffeine Use: 1 (11/18/2009) Exercise: yes (11/18/2009)  Risk Factors: Smoking Status: never (11/18/2009) Cans of tobacco/wk: 1/4 (11/18/2009)  Social History: Retired Widow/Widower Never Smoked Alcohol use-no Drug use-no Does Patient Exercise:  yes Caffeine use/day:  1 Smoking Status:  never Dental Care w/in 6 mos.:  no Fall Risk:  no Drug Use:  no  Review of Systems      See HPI  Physical Exam  General:  Well-developed,well-nourished,in no acute distress; alert,appropriate and cooperative throughout examination Head:  Normocephalic and atraumatic without obvious abnormalities. No apparent alopecia or balding. Eyes:  pupils equal, pupils round, pupils reactive to light, and corneas and lenses clear.   Ears:  External ear exam shows no significant lesions or deformities.  Otoscopic examination reveals clear canals, tympanic membranes are intact bilaterally without bulging, retraction, inflammation or discharge. Hearing is grossly normal bilaterally. Nose:  External nasal examination shows no deformity or inflammation. Nasal mucosa are pink and moist without lesions or exudates. Mouth:  Oral mucosa and oropharynx without lesions or exudates.  Teeth in good repair. Neck:  No deformities, masses, or tenderness noted.no carotid bruits.   Chest Wall:  No deformities, masses, tenderness or gynecomastia noted. Lungs:  Normal respiratory effort, chest expands symmetrically. Lungs are clear to auscultation, no crackles or wheezes. Heart:  Normal rate and regular rhythm. S1 and S2 normal without gallop, murmur, click, rub or other extra  sounds. Abdomen:  Bowel sounds positive,abdomen soft and non-tender without masses, organomegaly or hernias noted. Rectal:  No external abnormalities noted. Normal sphincter tone. No rectal masses or tenderness. Genitalia:  Testes bilaterally descended without nodularity, tenderness or masses. No scrotal masses or lesions. No penis lesions or urethral discharge. Prostate:  Prostate gland firm and smooth, no enlargement, nodularity, tenderness, mass, asymmetry or induration. Msk:  normal ROM, no joint tenderness, no joint swelling, no joint warmth, no redness over joints, no joint deformities, no joint instability, and no crepitation.   Pulses:  R popliteal normal, R posterior tibial normal, R dorsalis pedis normal, L popliteal normal, L posterior tibial normal, and L dorsalis pedis normal.   Extremities:  No clubbing, cyanosis, edema, or deformity noted with normal full range of motion of all joints.   Neurologic:  No cranial nerve deficits noted. Station and gait are normal. Plantar reflexes are down-going bilaterally. DTRs are symmetrical throughout. Sensory, motor and coordinative functions appear intact. Skin:  Intact without suspicious lesions or rashes Cervical Nodes:  No lymphadenopathy noted Axillary Nodes:  No palpable lymphadenopathy Psych:  Cognition and judgment appear intact. Alert and cooperative with normal attention span and  concentration. No apparent delusions, illusions, hallucinations   Impression & Recommendations:  Problem # 1:  PREVENTIVE HEALTH CARE (ICD-V70.0)  Orders: Venipuncture (16109) TLB-Lipid Panel (80061-LIPID) TLB-BMP (Basic Metabolic Panel-BMET) (80048-METABOL) TLB-CBC Platelet - w/Differential (85025-CBCD) TLB-Hepatic/Liver Function Pnl (80076-HEPATIC) TLB-PSA (Prostate Specific Antigen) (84153-PSA) Specimen Handling (60454) Medicare -1st Annual Wellness Visit [phone]) EKG w/ Interpretation (93000)  Reviewed preventive care protocols, scheduled due  services, and updated immunizations.  Problem # 2:  ELEVATED PROSTATE SPECIFIC ANTIGEN (ICD-790.93)  Orders: Venipuncture (91478) TLB-Lipid Panel (80061-LIPID) TLB-BMP (Basic Metabolic Panel-BMET) (80048-METABOL) TLB-CBC Platelet - w/Differential (85025-CBCD) TLB-Hepatic/Liver Function Pnl (80076-HEPATIC) TLB-PSA (Prostate Specific Antigen) (84153-PSA) Specimen Handling (29562)  Problem # 3:  HYPERTENSION (ICD-401.9)  His updated medication list for this problem includes:    Carvedilol 6.25 Mg Tabs (Carvedilol) .Marland Kitchen... 1 by mouth two times a day  Orders: Venipuncture (13086) TLB-Lipid Panel (80061-LIPID) TLB-BMP (Basic Metabolic Panel-BMET) (80048-METABOL) TLB-CBC Platelet - w/Differential (85025-CBCD) TLB-Hepatic/Liver Function Pnl (80076-HEPATIC) TLB-PSA (Prostate Specific Antigen) (84153-PSA) Specimen Handling (57846)  BP today: 122/80 Prior BP: 140/82 (07/25/2009)  Labs Reviewed: K+: 4.5 (07/25/2009) Creat: : 0.8 (07/25/2009)   Chol: 97 (07/25/2009)   HDL: 30.20 (07/25/2009)   LDL: 50 (07/25/2009)   TG: 86.0 (07/25/2009)  Problem # 4:  HYPERLIPIDEMIA (ICD-272.4)  His updated medication list for this problem includes:    Crestor 10 Mg Tabs (Rosuvastatin calcium) .Marland Kitchen... 1 by mouth daily  Orders: Venipuncture (96295) TLB-Lipid Panel (80061-LIPID) TLB-BMP (Basic Metabolic Panel-BMET) (80048-METABOL) TLB-CBC Platelet - w/Differential (85025-CBCD) TLB-Hepatic/Liver Function Pnl (80076-HEPATIC) TLB-PSA (Prostate Specific Antigen) (84153-PSA) Specimen Handling (28413)  Labs Reviewed: SGOT: 21 (07/25/2009)   SGPT: 17 (07/25/2009)   HDL:30.20 (07/25/2009), 30.30 (03/18/2009)  LDL:50 (07/25/2009), 49 (03/18/2009)  Chol:97 (07/25/2009), 97 (03/18/2009)  Trig:86.0 (07/25/2009), 89.0 (03/18/2009)  Problem # 5:  DIABETES MELLITUS, TYPE II (ICD-250.00)  His updated medication list for this problem includes:    Baby Aspirin 81 Mg Chew (Aspirin) .Marland Kitchen... Take one tablet daily     Metformin Hcl 500 Mg Xr24h-tab (Metformin hcl) .Marland Kitchen... Take 2 by mouth once daily  Orders: Venipuncture (24401) TLB-Lipid Panel (80061-LIPID) TLB-BMP (Basic Metabolic Panel-BMET) (80048-METABOL) TLB-CBC Platelet - w/Differential (85025-CBCD) TLB-Hepatic/Liver Function Pnl (80076-HEPATIC) TLB-PSA (Prostate Specific Antigen) (84153-PSA) TLB-A1C / Hgb A1C (Glycohemoglobin) (83036-A1C) TLB-Microalbumin/Creat Ratio, Urine (82043-MALB) Specimen Handling (02725)  Labs Reviewed: Creat: 0.8 (07/25/2009)     Last Eye Exam: normal (04/07/2008) Reviewed HgBA1c results: 6.3 (07/25/2009)  6.3 (03/18/2009)  Complete Medication List: 1)  Carvedilol 6.25 Mg Tabs (Carvedilol) .Marland Kitchen.. 1 by mouth two times a day 2)  Crestor 10 Mg Tabs (Rosuvastatin calcium) .Marland Kitchen.. 1 by mouth daily 3)  Baby Aspirin 81 Mg Chew (Aspirin) .... Take one tablet daily 4)  Bayer Contour Test Strp (Glucose blood) .... Bs  two times a day 5)  Fish Oil Concentrate 120-180 Mg Caps (Omega-3 fatty acids) 6)  Metformin Hcl 500 Mg Xr24h-tab (Metformin hcl) .... Take 2 by mouth once daily 7)  Centrum Silver Ultra Mens Tabs (Multiple vitamins-minerals) .... By mouth once daily 8)  Zostavax 36644 Unt/0.99ml Solr (Zoster vaccine live) .Marland Kitchen.. 1 ml im x1  Other Orders: Admin 1st Vaccine (03474) Flu Vaccine 58yrs + (25956) T-Culture, Urine (38756-43329) UA Dipstick W/ Micro (manual) (81000) Prescriptions: CARVEDILOL 6.25 MG TABS (CARVEDILOL) 1 by mouth two times a day  #180 x 3   Entered and Authorized by:   Loreen Freud DO   Signed by:   Loreen Freud DO on 11/18/2009   Method used:   Faxed to .Marland KitchenMarland Kitchen  [facility] (mail[facility])       [address] [place], [place]  [address]       Ph: [phone]       Fax: [phone]   RxID:   [id number] ZOSTAVAX 19400 UNT/0.65ML SOLR (ZOSTER VACCINE LIVE) 1 ml im x1  #1 x 0   Entered and Authorized by:   Loreen Freud DO   Signed by:   Loreen Freud DO on 11/18/2009   Method used:   Print then  Give to Patient   RxID:   [id number]  Flu Vaccine Consent Questions     Do you have a history of severe allergic reactions to this vaccine? no    Any prior history of allergic reactions to egg and/or gelatin? no    Do you have a sensitivity to the preservative Thimersol? no    Do you have a past history of Guillan-Barre Syndrome? no    Do you currently have an acute febrile illness? no    Have you ever had a severe reaction to latex? no    Vaccine information given and explained to patient? yes    Are you currently pregnant? no    Lot Number:[id number]   Exp Date:07/29/2010   Site Given  Left Deltoid IM  Orders Added: 1)  Admin 1st Vaccine [90471] 2)  Flu Vaccine 34yrs + [90658] 3)  Venipuncture [36415] 4)  TLB-Lipid Panel [80061-LIPID] 5)  TLB-BMP (Basic Metabolic Panel-BMET) [80048-METABOL] 6)  TLB-CBC Platelet - w/Differential [85025-CBCD] 7)  TLB-Hepatic/Liver Function Pnl [80076-HEPATIC] 8)  TLB-PSA (Prostate Specific Antigen) [84153-PSA] 9)  TLB-A1C / Hgb A1C (Glycohemoglobin) [83036-A1C] 10)  TLB-Microalbumin/Creat Ratio, Urine [82043-MALB] 11)  Specimen Handling [99000] 12)  T-Culture, Urine [74259-56387] 13)  UA Dipstick W/ Micro (manual) [81000] 14)  Medicare -1st Annual Wellness Visit [G0438] 15)  EKG w/ Interpretation [93000] 16)  Est. Patient Level III [56433]   .lbflu1  Flu Vaccine Result Date:  11/18/2009 Flu Vaccine Result:  given Flu Vaccine Next Due:  1 yr Colonoscopy Result Date:  07/28/1999 Colonoscopy Result:  int hem Colonoscopy Next Due:  10 yr  Laboratory Results   Urine Tests   Date/Time Reported: November 18, 2009 11:32 AM   Routine Urinalysis   Color: yellow Appearance: Hazy Glucose: negative   (Normal Range: Negative) Bilirubin: negative   (Normal Range: Negative) Ketone: negative   (Normal Range: Negative) Spec. Gravity: 1.020   (Normal Range: 1.003-1.035) Blood: small   (Normal Range: Negative) pH: 5.0   (Normal Range:  5.0-8.0) Protein: negative   (Normal Range: Negative) Urobilinogen: negative   (Normal Range: 0-1) Nitrite: negative   (Normal Range: Negative) Leukocyte Esterace: negative   (Normal Range: Negative)    Comments: Floydene Flock  November 18, 2009 11:32 AM cx sent     Appended Document: CPX//PH pt is not depressed---he is not suicidal He is not anxious

## 2010-03-02 NOTE — Progress Notes (Signed)
Summary: med too expensive  Phone Note Call from Patient Call back at Work Phone [phone]   Caller: Patient Summary of Call: pt called stating that he was still waiting on his samples that the nurse states that she was sending. informed pt that the nurse must was not aware that he was not in the states to come pick up sample.pt states that he can not afford the janumet so he would prefer to go back to metformin 1000mg  is this ok. pt state that BS has been running low at 75-78 since starting the janumet. [facility] [phone] [place].  pls advise........................Marland KitchenFelecia Deloach CMA  March 14, 2009 11:28 AM   Follow-up for Phone Call        ok to switch to metformin xr  500 2 by mouth once daily ---#60  2 refills---we will need to recheck labs when he is back---because a1c was elevated last time. Follow-up by: Loreen Freud DO,  March 14, 2009 12:15 PM  Additional Follow-up for Phone Call Additional follow up Details #1::        rx faxed to [phone], pt aware............Marland KitchenFelecia Deloach CMA  March 14, 2009 2:32 PM     New/Updated Medications: METFORMIN HCL 500 MG XR24H-TAB (METFORMIN HCL) Take 2 by mouth once daily Prescriptions: METFORMIN HCL 500 MG XR24H-TAB (METFORMIN HCL) Take 2 by mouth once daily  #60 x 2   Entered by:   Jeremy Johann CMA   Authorized by:   Loreen Freud DO   Signed by:   Jeremy Johann CMA on 03/14/2009   Method used:   Printed then faxed to ...       [facility] (mail-order)       [address]       [place], [place]  [address]       Ph: [phone]       Fax: [phone]   RxID:   [id number]

## 2010-03-02 NOTE — Progress Notes (Signed)
Summary: Med change   Phone Note Call from Patient Call back at Work Phone [phone]   Summary of Call: Pt called and stated that the Janumet is to expensive for him. Is there an alternative medication?  Initial call taken by: Army Fossa CMA,  February 07, 2009 4:23 PM  Follow-up for Phone Call        give pt assistance form to see if he can get it that way--- give samples to help until we find out.  Follow-up by: Loreen Freud DO,  February 07, 2009 4:56 PM  Additional Follow-up for Phone Call Additional follow up Details #1::        spoke with pts son in law and they are aware of this. Army Fossa CMA  February 07, 2009 5:05 PM

## 2010-03-02 NOTE — Progress Notes (Signed)
Summary: Request for lab results  Phone Note Call from Patient Call back at Home Phone [phone] Call back at Work Phone [phone]   Caller: Patient Summary of Call: Message left on Triage VM: Patient would like lab results    [facility]  August 03, 2009 10:26 AM   Follow-up for Phone Call        pls advise Total Protein, Urine Timed...Marland KitchenMarland KitchenFelecia Deloach CMA  August 03, 2009 4:27 PM   Additional Follow-up for Phone Call Additional follow up Details #1::        see lab Additional Follow-up by: Loreen Freud DO,  August 03, 2009 7:41 PM    Additional Follow-up for Phone Call Additional follow up Details #2::    DISCUSS WITH PATIENT..............Marland KitchenFelecia Deloach CMA  August 04, 2009 9:20 AM

## 2010-03-02 NOTE — Progress Notes (Signed)
Summary: CT  Phone Note Call from Patient Call back at [phone]   Caller: Daughter Summary of Call: Pts daughter called and would like to know if you can order a CT scan that can be done in [place]. Please advise. Army Fossa CMA  April 11, 2009 11:22 AM   Follow-up for Phone Call        I can not order CT in [place].  Pt needs to see a Dr there so they can order it.   Follow-up by: Loreen Freud DO,  April 11, 2009 12:13 PM  Additional Follow-up for Phone Call Additional follow up Details #1::        Pts daughter is aware. Army Fossa CMA  April 11, 2009 1:20 PM

## 2010-03-02 NOTE — Progress Notes (Signed)
Summary: Glucose Log Brought by Patient  Glucose Log Brought by Patient   Imported By: Lanelle Bal 03/23/2009 12:05:38  _____________________________________________________________________  External Attachment:    Type:   Image     Comment:   External Document

## 2010-03-02 NOTE — Letter (Signed)
Summary: [facility]  [facility]   Imported By: Lanelle Bal 05/13/2009 08:52:12  _____________________________________________________________________  External Attachment:    Type:   Image     Comment:   External Document

## 2010-03-02 NOTE — Letter (Signed)
Summary: [facility]  [facility]   Imported By: Lanelle Bal 05/31/2009 08:46:20  _____________________________________________________________________  External Attachment:    Type:   Image     Comment:   External Document

## 2010-03-02 NOTE — Medication Information (Signed)
Summary: Letter Regarding Adding Lipid Lowering Agent/[facility]  Letter Regarding Adding Lipid Lowering Agent/[facility]   Imported By: Lanelle Bal 09/23/2009 09:10:14  _____________________________________________________________________  External Attachment:    Type:   Image     Comment:   External Document

## 2010-03-02 NOTE — Progress Notes (Signed)
Summary: FYI  Phone Note Call from Patient Call back at [phone]   Caller: Daughter Summary of Call: Pts daugther called and stated that the pt was admitted at [facility] for Pneumonia.  Caller: Receptionist  Follow-up for Phone Call        noted----call early next week to see how pt is. Follow-up by: Loreen Freud DO,  March 30, 2009 8:58 PM

## 2010-03-02 NOTE — Assessment & Plan Note (Signed)
Summary: poison ivy on hand/a1c//kn   Vital Signs:  Patient profile:   70 year old male Height:      64 inches Weight:      149 pounds Temp:     98.2 degrees F oral Pulse rate:   60 / minute BP sitting:   140 / 82  (left arm)  Vitals Entered By: Jeremy Johann CMA (July 25, 2009 11:29 AM) CC: posion ivy on wrists and legsx4days Comments REVIEWED MED LIST, PATIENT AGREED DOSE AND INSTRUCTION CORRECT    History of Present Illness: worked in the yard few days ago Develop a rash in the left arm, right arm, leg and abdomen Mild pruritus Some relief with over-the-counter cream for poison ivy  Allergies (verified): No Known Drug Allergies  Past History:  Past Medical History: Reviewed history from 04/15/2006 and no changes required. Diabetes mellitus, type II GERD Hyperlipidemia Hypertension  Review of Systems       denies fevers has a blister in the left arm with clear discharge No arthralgias No tick  bites that he can recall  Physical Exam  General:  alert, well-developed, well-nourished, and well-hydrated.   Skin:  has a 1x1 cm area of redness and a blister in the left forearm A very small slightly red and papular spots in the right forearm and in the leg. Slightly red macular, less than 1 cm in size lesion in the epigastric area   Impression & Recommendations:  Problem # 1:  CONTACT DERMATITIS (ICD-692.9) likely contact dermatitis due to poison ivy Recommend to discontinue over-the-counter cream and use hydrocortisone as prescribed Patient to let me know if he developed redness or a purulent discharge His updated medication list for this problem includes:    Hydrocortisone 2.5 % Crea (Hydrocortisone) .Marland Kitchen... Apply three times a day x 1 week  Problem # 2:  DIABETES MELLITUS, TYPE II (ICD-250.00) due to see his  PCP, recommend to call and make an appointment labs drawn per previous orders His updated medication list for this problem includes:    Baby Aspirin  81 Mg Chew (Aspirin) .Marland Kitchen... Take one tablet daily    Metformin Hcl 500 Mg Xr24h-tab (Metformin hcl) .Marland Kitchen... Take 2 by mouth once daily  Orders: Venipuncture (01027) TLB-A1C / Hgb A1C (Glycohemoglobin) (83036-A1C) TLB-Microalbumin/Creat Ratio, Urine (82043-MALB)  Complete Medication List: 1)  Carvedilol 6.25 Mg Tabs (Carvedilol) .Marland Kitchen.. 1 by mouth two times a day 2)  Crestor 10 Mg Tabs (Rosuvastatin calcium) .Marland Kitchen.. 1 by mouth daily 3)  Baby Aspirin 81 Mg Chew (Aspirin) .... Take one tablet daily 4)  Bayer Contour Test Strp (Glucose blood) .... Bs  two times a day 5)  Fish Oil Concentrate 120-180 Mg Caps (Omega-3 fatty acids) 6)  Metformin Hcl 500 Mg Xr24h-tab (Metformin hcl) .... Take 2 by mouth once daily 7)  Hydrocortisone 2.5 % Crea (Hydrocortisone) .... Apply three times a day x 1 week  Other Orders: TLB-Lipid Panel (80061-LIPID) TLB-BMP (Basic Metabolic Panel-BMET) (80048-METABOL) TLB-Hepatic/Liver Function Pnl (80076-HEPATIC) Prescriptions: HYDROCORTISONE 2.5 % CREA (HYDROCORTISONE) apply three times a day x 1 week  #1 x 0   Entered and Authorized by:   Elita Quick E. Lissa Rowles MD   Signed by:   Nolon Rod. Delanda Bulluck MD on 07/25/2009   Method used:   Print then Give to Patient   RxID:   [id number]

## 2010-03-02 NOTE — Assessment & Plan Note (Signed)
Summary: discuss meds/drb   Vital Signs:  Patient profile:   70 year old male Weight:      155 pounds Pulse rate:   78 / minute Pulse rhythm:   regular BP sitting:   126 / 84  (left arm) Cuff size:   large  Vitals Entered By: Army Fossa CMA (March 18, 2009 1:47 PM) CC: Discuss meds    History of Present Illness: Eddie Mcdaniel here to discuss medication.  Eddie Mcdaniel also needs referrals for urology and optho for ins reasons. No complaints.    Current Medications (verified): 1)  Carvedilol 6.25 Mg Tabs (Carvedilol) .Marland Kitchen.. 1 By Mouth Two Times A Day 2)  Crestor 10 Mg Tabs (Rosuvastatin Calcium) .Marland Kitchen.. 1 By Mouth Daily 3)  Baby Aspirin 81 Mg  Chew (Aspirin) .... Take One Tablet Daily 4)  Bayer Contour Test  Strp (Glucose Blood) .... Bs  Two Times A Day 5)  Fish Oil Concentrate 120-180 Mg  Caps (Omega-3 Fatty Acids) 6)  Metformin Hcl 500 Mg Xr24h-Tab (Metformin Hcl) .... Take 2 By Mouth Once Daily  Allergies (verified): No Known Drug Allergies  Review of Systems      See HPI  Physical Exam  General:  Well-developed,well-nourished,in no acute distress; alert,appropriate and cooperative throughout examination Lungs:  Normal respiratory effort, chest expands symmetrically. Lungs are clear to auscultation, no crackles or wheezes. Heart:  normal rate and no murmur.   Psych:  Oriented X3, normally interactive, and good eye contact.     Impression & Recommendations:  Problem # 1:  ELEVATED PROSTATE SPECIFIC ANTIGEN (ICD-790.93)  Orders: Urology Referral (Urology)  Problem # 2:  HYPERTENSION (ICD-401.9)  His updated medication list for this problem includes:    Carvedilol 6.25 Mg Tabs (Carvedilol) .Marland Kitchen... 1 by mouth two times a day  Problem # 3:  HYPERLIPIDEMIA (ICD-272.4)  His updated medication list for this problem includes:    Crestor 10 Mg Tabs (Rosuvastatin calcium) .Marland Kitchen... 1 by mouth daily  Labs Reviewed: SGOT: 21 (01/27/2009)   SGPT: 25 (01/27/2009)   HDL:31.60 (01/27/2009),  23.60 (11/12/2008)  LDL:55 (01/27/2009), 61 (11/12/2008)  Chol:112 (01/27/2009), 113 (11/12/2008)  Trig:127.0 (01/27/2009), 142.0 (11/12/2008)  Problem # 4:  DIABETES MELLITUS, TYPE II (ICD-250.00)  His updated medication list for this problem includes:    Baby Aspirin 81 Mg Chew (Aspirin) .Marland Kitchen... Take one tablet daily    Metformin Hcl 500 Mg Xr24h-tab (Metformin hcl) .Marland Kitchen... Take 2 by mouth once daily  Orders: Ophthalmology Referral (Ophthalmology)  Labs Reviewed: Creat: 0.7 (01/27/2009)     Last Eye Exam: normal (04/07/2008) Reviewed HgBA1c results: 7.1 (01/27/2009)  7.2 (11/12/2008)  Complete Medication List: 1)  Carvedilol 6.25 Mg Tabs (Carvedilol) .Marland Kitchen.. 1 by mouth two times a day 2)  Crestor 10 Mg Tabs (Rosuvastatin calcium) .Marland Kitchen.. 1 by mouth daily 3)  Baby Aspirin 81 Mg Chew (Aspirin) .... Take one tablet daily 4)  Bayer Contour Test Strp (Glucose blood) .... Bs  two times a day 5)  Fish Oil Concentrate 120-180 Mg Caps (Omega-3 fatty acids) 6)  Metformin Hcl 500 Mg Xr24h-tab (Metformin hcl) .... Take 2 by mouth once daily Prescriptions: METFORMIN HCL 500 MG XR24H-TAB (METFORMIN HCL) Take 2 by mouth once daily  #180 x 3   Entered and Authorized by:   Loreen Freud DO   Signed by:   Loreen Freud DO on 03/18/2009   Method used:   Faxed to ...       [facility]       [phone]  [place], [place]  [address]       Ph: [phone]       Fax: [phone]   RxID:   [id number] METFORMIN HCL 500 MG XR24H-TAB (METFORMIN HCL) Take 2 by mouth once daily  #180 x 3   Entered and Authorized by:   Loreen Freud DO   Signed by:   Loreen Freud DO on 03/18/2009   Method used:   Print then Give to Patient   RxID:   [id number]

## 2010-03-02 NOTE — Letter (Signed)
Summary: Generic Letter  [place] [place]  [address] [place], [place] [address]   Phone: [phone]  Fax: [phone]       11/30/2009   VERNICE BOWKER [address] [place], [place]  [address]    Dear Mr. FACKLER,       I have tried to contact you by telephone in regards to your last office visit without success. Please contact me [phone] so we can discuss your most recent lab results. I will be in the office Monday-Friday from 8am-5pm. I look forward to speaking with you.       Sincerely,       Loreen Freud DO Almeta Monas CMA (AAMA)

## 2010-03-02 NOTE — Assessment & Plan Note (Signed)
[facility]   Vital Signs:  Patient profile:   70 year old male Weight:      147 pounds Pulse rate:   82 / minute Pulse rhythm:   regular BP sitting:   110 / 68  (left arm) Cuff size:   large  Vitals Entered By: Army Fossa CMA (April 13, 2009 10:38 AM) CC: Pt here for follow up from hospital.   History of Present Illness: Pt here with daughter for hospital  f/u.  Pt needs f/u with pulm in [place].  He will be there until May.  They got in last night and are leaving today to go back.   Pt is feeling much better but is tired.  Hospital records reviewed and will be scanned in.     Current Medications (verified): 1)  Carvedilol 6.25 Mg Tabs (Carvedilol) .Marland Kitchen.. 1 By Mouth Two Times A Day 2)  Crestor 10 Mg Tabs (Rosuvastatin Calcium) .Marland Kitchen.. 1 By Mouth Daily 3)  Baby Aspirin 81 Mg  Chew (Aspirin) .... Take One Tablet Daily 4)  Bayer Contour Test  Strp (Glucose Blood) .... Bs  Two Times A Day 5)  Fish Oil Concentrate 120-180 Mg  Caps (Omega-3 Fatty Acids) 6)  Metformin Hcl 500 Mg Xr24h-Tab (Metformin Hcl) .... Take 2 By Mouth Once Daily 7)  Amaryl 2 Mg Tabs (Glimepiride) .Marland Kitchen.. 1 By Mouth Two Times A Day Before Meals.  Allergies (verified): No Known Drug Allergies  Past History:  Past medical, surgical, family and social histories (including risk factors) reviewed for relevance to current acute and chronic problems.  Past Medical History: Reviewed history from 04/15/2006 and no changes required. Diabetes mellitus, type II GERD Hyperlipidemia Hypertension  Family History: Reviewed history and no changes required.  Social History: Reviewed history and no changes required.  Review of Systems      See HPI  Physical Exam  General:  Well-developed,well-nourished,in no acute distress; alert,appropriate and cooperative throughout examination Lungs:  Normal respiratory effort, chest expands symmetrically. Lungs are clear to auscultation, no crackles or wheezes. Heart:   normal rate and no murmur.   Extremities:  No clubbing, cyanosis, edema, or deformity noted with normal full range of motion of all joints.   Skin:  Intact without suspicious lesions or rashes Cervical Nodes:  No lymphadenopathy noted Psych:  Oriented X3 and normally interactive.     Impression & Recommendations:  Problem # 1:  PNEUMONIA, BILATERAL (ICD-486)  Orders: Pulmonary Referral (Pulmonary)  Problem # 2:  COMPUTERIZED TOMOGRAPHY, CHEST, ABNORMAL (ICD-793.1)  Problem # 3:  DIABETES MELLITUS, TYPE II (ICD-250.00)  The following medications were removed from the medication list:    Amaryl 2 Mg Tabs (Glimepiride) .Marland Kitchen... 1 by mouth two times a day before meals. His updated medication list for this problem includes:    Baby Aspirin 81 Mg Chew (Aspirin) .Marland Kitchen... Take one tablet daily    Metformin Hcl 500 Mg Xr24h-tab (Metformin hcl) .Marland Kitchen... Take 2 by mouth once daily  Labs Reviewed: Creat: 0.7 (03/18/2009)     Last Eye Exam: normal (04/07/2008) Reviewed HgBA1c results: 6.3 (03/18/2009)  7.1 (01/27/2009)  Complete Medication List: 1)  Carvedilol 6.25 Mg Tabs (Carvedilol) .Marland Kitchen.. 1 by mouth two times a day 2)  Crestor 10 Mg Tabs (Rosuvastatin calcium) .Marland Kitchen.. 1 by mouth daily 3)  Baby Aspirin 81 Mg Chew (Aspirin) .... Take one tablet daily 4)  Bayer Contour Test Strp (Glucose blood) .... Bs  two times a day 5)  Fish Oil Concentrate 120-180 Mg  Caps (Omega-3 fatty acids) 6)  Metformin Hcl 500 Mg Xr24h-tab (Metformin hcl) .... Take 2 by mouth once daily

## 2010-03-02 NOTE — Letter (Signed)
Summary: Results Follow up Letter  [place] [place]  [address] [place], [place] [address]   Phone: [phone]  Fax: [phone]    01/31/2009 MRN: [id number]  [facility] [address] [place], [place]  [address]  Dear Mr. PARKERSON,  The following are the results of your recent test(s):  Test         Result    Pap Smear:        Normal _____  Not Normal _____ Comments: ______________________________________________________ Cholesterol: LDL(Bad cholesterol):         Your goal is less than:         HDL (Good cholesterol):       Your goal is more than: Comments:  ______________________________________________________ Mammogram:        Normal _____  Not Normal _____ Comments:  ___________________________________________________________________ Hemoccult:        Normal _____  Not normal _______ Comments:    _____________________________________________________________________ Other Tests:  See attachment for results.   We routinely do not discuss normal results over the telephone.  If you desire a copy of the results, or you have any questions about this information we can discuss them at your next office visit.   Sincerely,    Army Fossa CMA  January 31, 2009 10:27 AM

## 2010-03-02 NOTE — Letter (Signed)
Summary: [facility]  [facility]   Imported By: Lanelle Bal 04/18/2009 12:52:24  _____________________________________________________________________  External Attachment:    Type:   Image     Comment:   External Document

## 2010-03-02 NOTE — Progress Notes (Signed)
Summary: refill-[place]  Phone Note Refill Request Call back at [phone] Message from:  Patient  Refills Requested: Medication #1:  CARVEDILOL 6.25 MG TABS 1 by mouth two times a day   Dosage confirmed as above?Dosage Confirmed   Supply Requested: [facility] months [facility] [id number]  Initial call taken by: Jeremy Johann CMA,  April 26, 2009 10:48 AM  Follow-up for Phone Call        pt aware rx faxed.............Marland KitchenFelecia Deloach CMA  April 26, 2009 10:53 AM     Prescriptions: CARVEDILOL 6.25 MG TABS (CARVEDILOL) 1 by mouth two times a day  #180 x 1   Entered by:   Jeremy Johann CMA   Authorized by:   Loreen Freud DO   Signed by:   Jeremy Johann CMA on 04/26/2009   Method used:   Printed then faxed to ...       [facility]       [address] [place], [place]  [address]       Ph: [phone]       Fax: [phone]   RxID:   [id number]

## 2010-03-02 NOTE — Progress Notes (Signed)
Summary: Medication Change  Phone Note Call from Patient Call back at Work Phone [phone]   Caller: Patient Call For: Loreen Freud DO Summary of Call: Patient is currently taking Lipitor and has ran out.  Patient says he can get Crestor from his son.  He needs to know if thats ok and what mg he should be on.  Also [employer] has not responded about the Janumet.  Please call with answer. Initial call taken by: Barnie Mort,  March 08, 2009 1:55 PM  Follow-up for Phone Call        crestor 10 mg give samples of janumet  --- did he get the forms we sent? Follow-up by: Loreen Freud DO,  March 08, 2009 4:00 PM  Additional Follow-up for Phone Call Additional follow up Details #1::        Pt is aware- will put samples up front of the janumet. Army Fossa CMA  March 08, 2009 4:03 PM

## 2010-04-07 ENCOUNTER — Telehealth (INDEPENDENT_AMBULATORY_CARE_PROVIDER_SITE_OTHER): Payer: Self-pay | Admitting: *Deleted

## 2010-04-11 ENCOUNTER — Encounter: Payer: Self-pay | Admitting: Family Medicine

## 2010-04-11 NOTE — Progress Notes (Signed)
Summary: refill  Phone Note Refill Request Call back at Work Phone [phone]   Refills Requested: Medication #1:  METFORMIN HCL 500 MG XR24H-TAB Take 2 by mouth once daily Pt did not leave Pharmacy name.Marti Sleigh Deloach CMA  April 07, 2010 10:21 AM   Initial call taken by: Jeremy Johann CMA,  April 07, 2010 10:20 AM  Follow-up for Phone Call        I spoke with patient and reviewed labs from October, I advised to decrease Crestor to 5mg , and he stated he wa awre and had been cutting the 10's in half. Stated he is in [place] until april and I advised he will need to come in and have labs done as soon as he gets back, he agreed and voiced understanding.Marland KitchenMarland KitchenMarland KitchenWants Rx to go to [facility]   Follow-up by: Almeta Monas CMA Duncan Dull),  April 07, 2010 11:24 AM    Prescriptions: METFORMIN HCL 500 MG XR24H-TAB (METFORMIN HCL) Take 2 by mouth once daily  #180 x 1   Entered by:   Almeta Monas CMA (AAMA)   Authorized by:   Loreen Freud DO   Signed by:   Almeta Monas CMA (AAMA) on 04/07/2010   Method used:   Print then Give to Patient   RxID:   [id number]   Appended Document: refill    Prescriptions: METFORMIN HCL 500 MG XR24H-TAB (METFORMIN HCL) Take 2 by mouth once daily  #180 x 1   Entered by:   Almeta Monas CMA (AAMA)   Authorized by:   Loreen Freud DO   Signed by:   Almeta Monas CMA (AAMA) on 04/07/2010   Method used:   Faxed to ...       [facility]       [address] [place], [place]  [address]       Ph: [phone]       Fax: [phone]   RxID:   [id number]

## 2010-04-27 NOTE — Medication Information (Signed)
Summary[facility]  [facility]   Imported By: Maryln Gottron 04/17/2010 14:57:56  _____________________________________________________________________  External Attachment:    Type:   Image     Comment:   External Document

## 2010-04-29 ENCOUNTER — Encounter: Payer: Self-pay | Admitting: Family Medicine

## 2010-05-01 ENCOUNTER — Encounter: Payer: Self-pay | Admitting: Family Medicine

## 2010-05-01 ENCOUNTER — Ambulatory Visit (INDEPENDENT_AMBULATORY_CARE_PROVIDER_SITE_OTHER): Payer: PRIVATE HEALTH INSURANCE | Admitting: Family Medicine

## 2010-05-01 DIAGNOSIS — R972 Elevated prostate specific antigen [PSA]: Secondary | ICD-10-CM

## 2010-05-01 DIAGNOSIS — E119 Type 2 diabetes mellitus without complications: Secondary | ICD-10-CM

## 2010-05-01 DIAGNOSIS — E785 Hyperlipidemia, unspecified: Secondary | ICD-10-CM

## 2010-05-01 DIAGNOSIS — I10 Essential (primary) hypertension: Secondary | ICD-10-CM

## 2010-05-01 DIAGNOSIS — J069 Acute upper respiratory infection, unspecified: Secondary | ICD-10-CM

## 2010-05-01 LAB — BASIC METABOLIC PANEL
BUN: 10 mg/dL (ref 6–23)
CO2: 27 mEq/L (ref 19–32)
Chloride: 99 mEq/L (ref 96–112)
GFR: 142.42 mL/min (ref >60.00)
Glucose, Bld: 126 mg/dL — ABNORMAL HIGH (ref 70–99)
Potassium: 4.6 mEq/L (ref 3.5–5.1)

## 2010-05-01 LAB — LIPID PANEL
HDL: 26.2 mg/dL — ABNORMAL LOW (ref >39.00)
LDL Cholesterol: 57 mg/dL (ref 0–99)
Total CHOL/HDL Ratio: 4
Triglycerides: 142 mg/dL (ref 0.0–149.0)
VLDL: 28.4 mg/dL (ref 0.0–40.0)

## 2010-05-01 LAB — HEPATIC FUNCTION PANEL
Albumin: 3.9 g/dL (ref 3.5–5.2)
Total Bilirubin: 0.4 mg/dL (ref 0.3–1.2)

## 2010-05-01 LAB — MICROALBUMIN / CREATININE URINE RATIO
Creatinine,U: 77.4 mg/dL
Microalb, Ur: 3.6 mg/dL — ABNORMAL HIGH (ref 0.0–1.9)

## 2010-05-01 NOTE — Assessment & Plan Note (Signed)
con't meds  Check labs 

## 2010-05-01 NOTE — Assessment & Plan Note (Signed)
Cont meds Check labs 

## 2010-05-01 NOTE — Progress Notes (Signed)
  Subjective:    Patient ID: Eddie Mcdaniel, male    DOB: April 26, 1940, 70 y.o.   MRN: [id number]  HPI HYPERTENSION Disease Monitoring Blood pressure range-not checking Chest pain- no      Dyspnea-no Medications Compliance- good Lightheadedness- no   Edema- no   DIABETES Disease Monitoring Blood Sugar ranges-125  Polyuria- no New Visual problems- no Medications Compliance- ran out of med in [place]-- got metformin xr there Hypoglycemic symptoms- no   HYPERLIPIDEMIA Disease Monitoring See symptoms for Hypertension Medications Compliance- good RUQ pain- no  Muscle aches- no  ROS See HPI above   PMH Smoking Status noted    Review of Systems See above    Objective:   Physical Exam  Constitutional: He appears well-developed and well-nourished. No distress.  HENT:  Head: Normocephalic and atraumatic.  Nose: Nose normal.  Mouth/Throat: Posterior oropharyngeal erythema present. No oropharyngeal exudate, posterior oropharyngeal edema or tonsillar abscesses.  Neck: Normal range of motion. Neck supple.  Cardiovascular: Normal rate and regular rhythm.   No murmur heard. Pulmonary/Chest: Breath sounds normal. No respiratory distress. He has no wheezes. He has no rales. He exhibits no tenderness.  Abdominal: Soft. Bowel sounds are normal.  Musculoskeletal: Normal range of motion.  Lymphadenopathy:    He has no cervical adenopathy.  Neurological: He is alert.  Skin: Skin is warm.  Psychiatric: He has a normal mood and affect. Thought content normal.          Assessment & Plan:

## 2010-05-01 NOTE — Assessment & Plan Note (Signed)
con't meds  check labs

## 2010-05-04 ENCOUNTER — Encounter: Payer: Self-pay | Admitting: Family Medicine

## 2010-05-09 ENCOUNTER — Telehealth: Payer: Self-pay

## 2010-05-09 NOTE — Telephone Encounter (Signed)
Message copied by Almeta Monas on Tue May 09, 2010  1:31 PM ------      Message from: Loreen Freud      Created: Mon May 01, 2010  2:53 PM       Cholesterol--- LDL goal < 70,  HDL >40,  TG < 150.  Diet and exercise will increase HDL and decrease LDL and TG.  Fish,  Fish Oil, Flaxseed oil will also help increase the HDL and decrease Triglycerides.  If pt is taking Crestor 10---increase to 20 mg.  If he is taking 5mg  .  Increase to 10mg .      Your hgba1c is > 7.0.  Increase Glucophage xr 750 mg 2 po qpm  #60,  2 refills      Recheck 3 months----272.4  250.00  Lipid, hep, hgba1c, bmp

## 2010-05-09 NOTE — Progress Notes (Signed)
See phone note--   KP 

## 2010-05-16 NOTE — Telephone Encounter (Signed)
Discuss with patient. Pt states that he is currently taking 5 mg of crestor and will increase to 10 mg, will also increase to Glucophage XR 750 mg. Pt notes that he does not need Rx sent in to pharmacy already has med on hand, copy of labs mailed at Pt request to [address] [place], [place] [address]

## 2010-05-30 ENCOUNTER — Telehealth: Payer: Self-pay

## 2010-05-30 MED ORDER — GLUCOSE BLOOD VI STRP: ORAL_STRIP | Status: DC

## 2010-05-30 NOTE — Telephone Encounter (Signed)
Request for Contour blood test strips be sent to [facility]    KP--- FYI, FYI--- spoke with patient and he received his labs and he has been advised to increase the Crestor to 10mg  and Glucophage XR 750 bid, he stated he wanted to wait until after he has labs done in  3 months before he increased meds because he was in [place] prior to having labs done and the food is just different and think it caused labs to be elevated      KP

## 2010-05-30 NOTE — Telephone Encounter (Signed)
ok 

## 2010-06-16 NOTE — Procedures (Signed)
[facility]  Patient:    Eddie Mcdaniel, Eddie Mcdaniel                         MRN: [id number] Proc. Date: 07/28/99 Adm. Date:  [id number] Attending:  Orland Mustard Dictator:   Llana Aliment Randa Evens, M.D. CC:         Lilyan Punt. Sydnee Levans, M.D.                           Procedure Report  PROCEDURE:  Colonoscopy.  MEDICATIONS: 1. Phentanyl 75 mcg intravenously. 2. Versed 7.5 mg intravenously.  SCOPE:  Adult Olympus video colonoscope.  INDICATIONS:  Colon cancer screening.  DESCRIPTION OF PROCEDURE:  The procedure was explained to the patient, consent obtained.  With the patient in the left lateral decubitus position, the Olympus adult video colonoscope was inserted and advanced under direct visualization.  The prep was excellent.  We were able to advance around to the cecum without difficulty.  The ileocecal valve seen.  Scope withdrawn.  Cecum, ascending colon, hepatic flexure, transverse colon, splenic flexure, descending and sigmoid colon seen well upon removal.  Internal hemorrhoids seen in the rectum upon removal of the scope.  The scope was withdrawn.  The patient tolerated the procedure well.  ASSESSMENT:  Internal hemorrhoids, otherwise negative colonoscopy.  PLAN:  We will see back on an as needed basis.  Would suggest yearly Hemoccults with a repeat colonoscopy in ten years at age 87 if appropriate. DD:  07/28/99 TD:  07/29/99 Job: [id number]

## 2010-07-14 ENCOUNTER — Encounter: Payer: Self-pay | Admitting: Family Medicine

## 2010-07-18 ENCOUNTER — Encounter: Payer: Self-pay | Admitting: Family Medicine

## 2010-07-18 ENCOUNTER — Ambulatory Visit (INDEPENDENT_AMBULATORY_CARE_PROVIDER_SITE_OTHER): Payer: PRIVATE HEALTH INSURANCE | Admitting: Family Medicine

## 2010-07-18 DIAGNOSIS — E785 Hyperlipidemia, unspecified: Secondary | ICD-10-CM

## 2010-07-18 DIAGNOSIS — E119 Type 2 diabetes mellitus without complications: Secondary | ICD-10-CM

## 2010-07-18 DIAGNOSIS — M25559 Pain in unspecified hip: Secondary | ICD-10-CM

## 2010-07-18 DIAGNOSIS — M25552 Pain in left hip: Secondary | ICD-10-CM

## 2010-07-18 DIAGNOSIS — I10 Essential (primary) hypertension: Secondary | ICD-10-CM

## 2010-07-18 LAB — HEPATIC FUNCTION PANEL
ALT: 22 U/L (ref 0–53)
AST: 22 U/L (ref 0–37)
Total Bilirubin: 0.5 mg/dL (ref 0.3–1.2)

## 2010-07-18 LAB — LIPID PANEL
Cholesterol: 108 mg/dL (ref 0–200)
LDL Cholesterol: 59 mg/dL (ref 0–99)

## 2010-07-18 LAB — BASIC METABOLIC PANEL
BUN: 11 mg/dL (ref 6–23)
CO2: 27 mEq/L (ref 19–32)
Chloride: 108 mEq/L (ref 96–112)
Creatinine, Ser: 0.7 mg/dL (ref 0.4–1.5)

## 2010-07-18 LAB — MICROALBUMIN / CREATININE URINE RATIO
Creatinine,U: 224.9 mg/dL
Microalb Creat Ratio: 1.4 mg/g (ref 0.0–30.0)

## 2010-07-18 MED ORDER — CARVEDILOL 6.25 MG PO TABS: 6.2500 mg | ORAL_TABLET | Freq: Two times a day (BID) | ORAL | Status: DC

## 2010-07-18 NOTE — Progress Notes (Signed)
  Subjective:    Patient ID: Eddie Mcdaniel, male    DOB: August 08, 1940, 70 y.o.   MRN: [id number]  HPI  HYPERTENSION Disease Monitoring Blood pressure range- 120/80 Chest pain- no      Dyspnea- no Medications Compliance- good Lightheadedness- no   Edema- no   DIABETES Disease Monitoring Blood Sugar ranges- 149 Polyuria- no New Visual problems- no Medications Compliance- good Hypoglycemic symptoms- no   HYPERLIPIDEMIA Disease Monitoring See symptoms for Hypertension Medications Compliance- good RUQ pain- no  Muscle aches- no  ROS See HPI above   PMH Smoking Status noted    Review of Systems As above---pt also c/o L hip pain in am only.  As day goes on stiffness subsides.  NO otc    Objective:   Physical Exam  Constitutional: He is oriented to person, place, and time. He appears well-developed and well-nourished.  Cardiovascular: Normal rate, regular rhythm and normal heart sounds.   No murmur heard. Pulmonary/Chest: Effort normal and breath sounds normal. No respiratory distress. He has no wheezes. He has no rales.  Musculoskeletal:       Left hip: He exhibits normal range of motion, normal strength, no tenderness, no bony tenderness, no swelling, no crepitus, no deformity and no laceration.       Legs: Neurological: He is alert and oriented to person, place, and time.  Skin: Skin is warm and dry. No rash noted. No erythema. No pallor.  Psychiatric: He has a normal mood and affect. His behavior is normal. Judgment and thought content normal.  Sensory exam of the foot is normal, tested with the monofilament. Good pulses, no lesions or ulcers, good peripheral pulses.       Assessment & Plan:

## 2010-07-18 NOTE — Patient Instructions (Signed)
Hip Pain The hips join the upper legs to the lower pelvis. The bones, cartilage, tendons, and muscles of the hip joint perform a lot of work each day holding your body weight and allowing you to move around. Hip pain is a common symptom. It can range from a minor ache to severe pain on 1 or both hips. Pain may be felt on the inside of the hip joint near the groin, or the outside near the buttocks and upper thigh. There may be swelling or stiffness as well. It occurs more often when a person walks or performs activity. There are many reasons hip pain can develop. CAUSES It is important to work with your caregiver to identify the cause since many conditions can impact the bones, cartilage, muscles, and tendons of the hips. Causes for hip pain include:  Broken (fractured) bones.   Separation of the thighbone from the hip socket (dislocation).   Torn cartilage of the hip joint.   Swelling (inflammation) of a tendon (tendonitis), the sac within the hip joint (bursitis), or a joint.   A weakening in the abdominal wall (hernia), affecting the nerves to the hip.   Arthritis in the hip joint or lining of the hip joint.   Pinched nerves in the back, hip, or upper thigh.   A bulging disc in the spine (herniated disc).   Rarely, bone infection or cancer.  DIAGNOSIS The location of your hip pain will help your caregiver understand what may be causing the pain. A diagnosis is based on your medical history, your symptoms, results from your physical exam, and results from diagnostic tests. Diagnostic tests may include X-ray exams, a computerized magnetic scan (magnetic resonance imaging, MRI), or bone scan. TREATMENT Treatment will depend on the cause of your hip pain. Treatment may include:  Limiting activities and resting until symptoms improve.   Crutches or other walking supports (a cane or brace).   Ice, elevation, and compression.   Physical therapy or home exercises.   Shoe inserts or  special shoes.   Losing weight.   Medications to reduce pain.   Undergoing surgery.  HOME CARE INSTRUCTIONS  Only take over-the-counter or prescription medicines for pain, discomfort, or fever as directed by your caregiver.   Put ice on the injured area:   Put ice in a plastic bag.   Place a towel between your skin and the bag.   Leave the ice on for 15 minutes at a time, FEW times a day.   Keep your leg raised (elevated) when possible to lessen swelling.   Avoid activities that cause pain.   Follow specific exercises as directed by your caregiver.   Sleep with a pillow between your legs on your most comfortable side.   Record how often you have hip pain, the location of the pain, and what it feels like. This information may be helpful to you and your caregiver.   Ask your caregiver about returning to work or sports and whether you should drive.   Follow up with your caregiver for further exams, therapy, or testing as directed.  SEEK MEDICAL CARE IF:  Pain or swelling continues or worsens beyond 1 week.   You have an oral temperature above 100.4.   You are feeling unwell or have chills.   You are having an increasingly difficult time with walking.   You have a loss of sensation or other new symptoms.   You have questions or concerns.  SEEK IMMEDIATE MEDICAL CARE IF:  You cannot put weight on the affected hip.   You have fallen.   You have a sudden increase in pain and swelling in your hip.   You have an oral temperature above 100.4, not controlled by medicine.  MAKE SURE YOU:  Understand these instructions.   Will watch your condition.   Will get help right away if you are not doing well or get worse.  Document Released: 07/05/2009  [facility] Patient Information 2011 [place], [place].

## 2010-07-18 NOTE — Assessment & Plan Note (Signed)
Check labs con't meds 

## 2010-07-18 NOTE — Assessment & Plan Note (Signed)
Stable con't meds 

## 2010-07-18 NOTE — Assessment & Plan Note (Signed)
Check labs today Pt never increased metformin Pt states sugars inc since quitting chewing tob

## 2010-07-18 NOTE — Assessment & Plan Note (Signed)
prob arthritis Can con't to take occass IB----if pt feels the need to take daily--- we will need to discuss options

## 2010-07-25 ENCOUNTER — Telehealth: Payer: Self-pay | Admitting: *Deleted

## 2010-07-25 ENCOUNTER — Encounter: Payer: Self-pay | Admitting: *Deleted

## 2010-07-25 MED ORDER — GLIMEPIRIDE 2 MG PO TABS: 2.0000 mg | ORAL_TABLET | Freq: Every day | ORAL | Status: DC

## 2010-07-25 NOTE — Telephone Encounter (Signed)
Discuss with patient, Rx sent to pharmacy copy of labs placed up front for pickup.

## 2010-07-25 NOTE — Telephone Encounter (Signed)
Message copied by Verdene Rio on Tue Jul 25, 2010 12:45 PM ------      Message from: Lelon Perla      Created: Sun Jul 23, 2010  3:23 PM       DM not controlled---- add amaryl 2 mg #30  1 po qd  , 2 refills---- recheck 3 months----250.00  272.4  Lipid, hep, bmp, hgba1c

## 2010-08-04 ENCOUNTER — Other Ambulatory Visit: Payer: Self-pay | Admitting: Family Medicine

## 2010-08-04 MED ORDER — METFORMIN HCL ER 500 MG PO TB24: 1000.0000 mg | ORAL_TABLET | Freq: Every day | ORAL | Status: DC

## 2010-08-04 NOTE — Telephone Encounter (Signed)
Faxed.   KP 

## 2010-09-06 ENCOUNTER — Telehealth: Payer: Self-pay | Admitting: Family Medicine

## 2010-09-06 NOTE — Telephone Encounter (Addendum)
Pt c/o leg weakness, and  fatigue which is due to (AMARYL) 2 MG tablet. Pt notes that he has since stopped med x2day and BS have been running Yesterday fasting 114,  today fasting 118. Pt indicated that he has felt funny since starting med. .Please advise

## 2010-09-06 NOTE — Telephone Encounter (Signed)
amaryl probably lowered BS too much---- if bld sugars rise he should take 1/2 tab amaryl----otherwise check labs in Sept as scheduled

## 2010-09-06 NOTE — Telephone Encounter (Signed)
Discuss with patient  

## 2010-10-17 ENCOUNTER — Other Ambulatory Visit: Payer: Self-pay | Admitting: Family Medicine

## 2010-10-17 DIAGNOSIS — E785 Hyperlipidemia, unspecified: Secondary | ICD-10-CM

## 2010-10-17 DIAGNOSIS — E119 Type 2 diabetes mellitus without complications: Secondary | ICD-10-CM

## 2010-10-18 ENCOUNTER — Other Ambulatory Visit (INDEPENDENT_AMBULATORY_CARE_PROVIDER_SITE_OTHER): Payer: PRIVATE HEALTH INSURANCE

## 2010-10-18 DIAGNOSIS — E119 Type 2 diabetes mellitus without complications: Secondary | ICD-10-CM

## 2010-10-18 DIAGNOSIS — E785 Hyperlipidemia, unspecified: Secondary | ICD-10-CM

## 2010-10-18 LAB — BASIC METABOLIC PANEL
BUN: 15 mg/dL (ref 6–23)
CO2: 25 mEq/L (ref 19–32)
Glucose, Bld: 83 mg/dL (ref 70–99)
Potassium: 3.9 mEq/L (ref 3.5–5.3)
Sodium: 143 mEq/L (ref 135–145)

## 2010-10-18 LAB — HEPATIC FUNCTION PANEL
Albumin: 4.3 g/dL (ref 3.5–5.2)
Alkaline Phosphatase: 66 U/L (ref 39–117)
Total Bilirubin: 0.5 mg/dL (ref 0.3–1.2)
Total Protein: 6.7 g/dL (ref 6.0–8.3)

## 2010-10-18 LAB — LIPID PANEL
HDL: 38 mg/dL — ABNORMAL LOW (ref >39)
LDL Cholesterol: 117 mg/dL — ABNORMAL HIGH (ref 0–99)
Total CHOL/HDL Ratio: 4.4 Ratio
Triglycerides: 65 mg/dL (ref <150)

## 2010-10-18 NOTE — Progress Notes (Signed)
Labs only

## 2010-10-19 LAB — MICROALBUMIN / CREATININE URINE RATIO
Creatinine, Urine: 266.7 mg/dL
Microalb Creat Ratio: 10 mg/g (ref 0.0–30.0)

## 2010-10-24 ENCOUNTER — Telehealth: Payer: Self-pay

## 2010-10-24 MED ORDER — ROSUVASTATIN CALCIUM 20 MG PO TABS: 20.0000 mg | ORAL_TABLET | Freq: Every day | ORAL | Status: DC

## 2010-10-24 NOTE — Telephone Encounter (Signed)
Message copied by Arnette Norris on Tue Oct 24, 2010  6:25 PM ------      Message from: Lelon Perla      Created: Sat Oct 21, 2010  9:14 PM       Increase crestor 20 mg #30  1 po qhs  ,  2 refills.      DM not controlled----  i need to see his formulary to see what else his insurance will pay for.   250.00  272.4  Lipid, hep, bmp, hgba1c,

## 2010-10-24 NOTE — Telephone Encounter (Signed)
Discussed with patient and advised on increase in Crestor, he declined sending Rx to pharmacy, he sated his son would take care of it for him and he wouls drop off a formulary tomorrow     KP

## 2010-10-26 ENCOUNTER — Telehealth: Payer: Self-pay

## 2010-10-26 ENCOUNTER — Other Ambulatory Visit: Payer: Self-pay

## 2010-10-26 MED ORDER — METFORMIN HCL ER 500 MG PO TB24: 1500.0000 mg | ORAL_TABLET | Freq: Every day | ORAL | Status: DC

## 2010-10-26 NOTE — Telephone Encounter (Signed)
Discussed with patient and he voiced understanding and agreed to take 3 pills daily.   Advised me no to call in meds because he has plenty. Medications updated on Med list    KP

## 2010-10-26 NOTE — Telephone Encounter (Signed)
Patient walk-in to the office today with his formulary so we can changed his diabetic medications. He stated he was not taking the Amaryl and we update his medication list. Patient requested a Tier 1 or 2 drug.Marland KitchenMarland KitchenMarland KitchenPlease advise

## 2010-10-26 NOTE — Telephone Encounter (Signed)
Pt stopped is amaryl---- all other meds beside metformin is tier 3 or higher.   We can increase metformin to xr 500mg  3 po qd ---- or refer to endo.  Recheck labs in 3 months----250.00  272.4  Lipid, hep, bmp, hgba1c

## 2010-10-27 ENCOUNTER — Other Ambulatory Visit: Payer: Self-pay | Admitting: Family Medicine

## 2010-10-27 DIAGNOSIS — M549 Dorsalgia, unspecified: Secondary | ICD-10-CM

## 2010-10-27 MED ORDER — GLUCOSE BLOOD VI STRP: ORAL_STRIP | Status: AC

## 2010-10-27 NOTE — Telephone Encounter (Signed)
Done  KP

## 2010-10-27 NOTE — Telephone Encounter (Signed)
Patient is requesting to see Dr Claria Dice (in [facility]) for his back issues, patient states Dr Regino Schultze is a network provider for his insurance

## 2010-10-27 NOTE — Telephone Encounter (Signed)
Test strips faxed--- Please advise on referral     KP

## 2010-10-27 NOTE — Telephone Encounter (Signed)
Ok to refer.

## 2010-10-27 NOTE — Telephone Encounter (Signed)
Patient needs test strips - bayer contour machine - [facility] - w [facility]

## 2010-10-30 ENCOUNTER — Encounter: Payer: Self-pay | Admitting: Family Medicine

## 2010-10-30 ENCOUNTER — Ambulatory Visit (INDEPENDENT_AMBULATORY_CARE_PROVIDER_SITE_OTHER): Payer: PRIVATE HEALTH INSURANCE | Admitting: Family Medicine

## 2010-10-30 VITALS — BP 146/88 | HR 63 | Temp 98.5°F | Wt 158.2 lb

## 2010-10-30 DIAGNOSIS — M549 Dorsalgia, unspecified: Secondary | ICD-10-CM

## 2010-10-30 DIAGNOSIS — I10 Essential (primary) hypertension: Secondary | ICD-10-CM

## 2010-10-30 DIAGNOSIS — M545 Low back pain, unspecified: Secondary | ICD-10-CM

## 2010-10-30 DIAGNOSIS — Z23 Encounter for immunization: Secondary | ICD-10-CM

## 2010-10-30 MED ORDER — GLUCOSE BLOOD VI STRP: ORAL_STRIP | Status: DC

## 2010-10-30 MED ORDER — METFORMIN HCL ER 500 MG PO TB24: 1500.0000 mg | ORAL_TABLET | Freq: Every day | ORAL | Status: DC

## 2010-10-30 MED ORDER — CARVEDILOL 6.25 MG PO TABS: 6.2500 mg | ORAL_TABLET | Freq: Two times a day (BID) | ORAL | Status: DC

## 2010-10-30 NOTE — Progress Notes (Signed)
  Subjective:    Eddie Mcdaniel is a 70 y.o. male who presents for evaluation of low back pain. The patient has had hx spinal stenosis. Symptoms have been present for 1 month and are gradually worsening.  Onset was related to / precipitated by no known injury. The pain is located in the across the lower back and radiates to the buttock, right foot. The pain is described as aching and burning and occurs intermittently. He rates his pain as a 6 on a scale of 0-10. Symptoms are exacerbated by lying down and standing. Symptoms are improved by heat, ice and rest. He has also tried nothing which provided no symptom relief. He has no other symptoms associated with the back pain. The patient has no "red flag" history indicative of complicated back pain.  Records from [facility] reviewed---records from 2007  The following portions of the patient's history were reviewed and updated as appropriate: allergies, current medications, past family history, past medical history, past social history, past surgical history and problem list.  Review of Systems Pertinent items are noted in HPI.    Objective:   Inspection and palpation: inspection of back is normal. Muscle tone and ROM exam: muscle tone normal without spasm, full range of motion with pain. Straight leg raise: negative at 90 degrees bilaterally. Neurological: normal DTRs, muscle strength and reflexes.    Assessment:    l lumbar stenosis, hx  Plan:    Natural history and expected course discussed. Questions answered. Agricultural engineer distributed. Regular aerobic and trunk strengthening exercises discussed. Short (2-4 day) period of relative rest recommended until acute symptoms improve. Ice to affected area as needed for local pain relief. Heat to affected area as needed for local pain relief. Orthopedic referral due to pt hx lumbar stenosis. --- pt will go back to Dr Regino Schultze

## 2010-10-30 NOTE — Patient Instructions (Signed)
Back Pain & Injury Your back pain is most likely caused by a strain of the muscles or ligaments supporting the spine. Back strains cause pain and trouble moving because of muscle spasms. They may take several weeks to heal. Usually they are better in days.  Treatment for back pain includes:  Rest - Get bed rest as needed over the next day or two. Use a firm mattress and lie on your side with your knees slightly bent. If you lie on your back, put a pillow under your knees.   Early movement - Back pain improves most rapidly if you remain active. It is much more stressful on the back to sit or stand in one place. Do not sit, drive or stand in one place for more than 30 minutes at a time. Take short walks on level surfaces as soon as pain allows.   Limit bending and lifting - Do not bend over or lift anything over 20 pounds until instructed otherwise. Lift by bending your knees. Use your leg muscles to help. Keep the load close to your body and avoid twisting. Do not reach or do overhead work.   Medicines - Medicine to reduce pain and inflammation are helpful. Muscle-relaxing drugs may be prescribed.   Therapy - Put ice packs on your back every few hours for the first 2-3 days after your injury or as instructed. After that ice or heat may be alternated to reduce pain and spasm. Back exercises and gentle massage may be of some benefit. You should be examined again if your back pain is not better in one week.  SEEK IMMEDIATE MEDICAL CARE IF:  You have pain that radiates from your back into your legs.   You develop new bowel or bladder control problems.   You have unusual weakness or numbness in your arms or legs.   You develop nausea or vomiting.   You develop abdominal pain.   You feel faint.  Document Released: 01/15/2005 Document Re-Released: 10/25/2007 [employer] Patient Information 2011 [employer]. 

## 2010-10-31 ENCOUNTER — Other Ambulatory Visit: Payer: Self-pay | Admitting: Family Medicine

## 2010-10-31 MED ORDER — METFORMIN HCL ER 500 MG PO TB24: 1500.0000 mg | ORAL_TABLET | Freq: Every day | ORAL | Status: DC

## 2010-11-02 ENCOUNTER — Other Ambulatory Visit: Payer: Self-pay | Admitting: *Deleted

## 2010-11-02 MED ORDER — METFORMIN HCL ER 500 MG PO TB24: 1500.0000 mg | ORAL_TABLET | Freq: Every day | ORAL | Status: DC

## 2010-11-02 NOTE — Telephone Encounter (Signed)
Rx not received, Rx resent

## 2010-11-06 ENCOUNTER — Other Ambulatory Visit: Payer: Self-pay | Admitting: Family Medicine

## 2010-11-06 DIAGNOSIS — M549 Dorsalgia, unspecified: Secondary | ICD-10-CM

## 2010-12-11 ENCOUNTER — Other Ambulatory Visit: Payer: Self-pay | Admitting: Neurosurgery

## 2010-12-11 DIAGNOSIS — M545 Low back pain, unspecified: Secondary | ICD-10-CM

## 2010-12-19 ENCOUNTER — Inpatient Hospital Stay: Admission: RE | Admit: 2010-12-19 | Payer: PRIVATE HEALTH INSURANCE | Source: Ambulatory Visit

## 2010-12-26 ENCOUNTER — Other Ambulatory Visit: Payer: Self-pay | Admitting: Family Medicine

## 2010-12-26 DIAGNOSIS — E785 Hyperlipidemia, unspecified: Secondary | ICD-10-CM

## 2010-12-26 DIAGNOSIS — E119 Type 2 diabetes mellitus without complications: Secondary | ICD-10-CM

## 2010-12-27 ENCOUNTER — Other Ambulatory Visit (INDEPENDENT_AMBULATORY_CARE_PROVIDER_SITE_OTHER): Payer: PRIVATE HEALTH INSURANCE

## 2010-12-27 ENCOUNTER — Other Ambulatory Visit: Payer: PRIVATE HEALTH INSURANCE

## 2010-12-27 DIAGNOSIS — E785 Hyperlipidemia, unspecified: Secondary | ICD-10-CM

## 2010-12-27 DIAGNOSIS — E119 Type 2 diabetes mellitus without complications: Secondary | ICD-10-CM

## 2010-12-27 LAB — LIPID PANEL
Total CHOL/HDL Ratio: 3
VLDL: 16.6 mg/dL (ref 0.0–40.0)

## 2010-12-27 LAB — BASIC METABOLIC PANEL
BUN: 12 mg/dL (ref 6–23)
CO2: 26 mEq/L (ref 19–32)
GFR: 100.54 mL/min (ref >60.00)
Glucose, Bld: 133 mg/dL — ABNORMAL HIGH (ref 70–99)
Potassium: 4.3 mEq/L (ref 3.5–5.1)

## 2010-12-27 LAB — HEPATIC FUNCTION PANEL
ALT: 25 U/L (ref 0–53)
AST: 22 U/L (ref 0–37)
Bilirubin, Direct: 0.1 mg/dL (ref 0.0–0.3)
Total Bilirubin: 0.6 mg/dL (ref 0.3–1.2)

## 2010-12-27 LAB — MICROALBUMIN / CREATININE URINE RATIO
Creatinine,U: 225.3 mg/dL
Microalb Creat Ratio: 1.7 mg/g (ref 0.0–30.0)

## 2010-12-27 NOTE — Progress Notes (Signed)
12  

## 2010-12-29 ENCOUNTER — Ambulatory Visit
Admission: RE | Admit: 2010-12-29 | Discharge: 2010-12-29 | Disposition: A | Discharge status: Home / Self Care | Payer: PRIVATE HEALTH INSURANCE | Source: Ambulatory Visit | Attending: Neurosurgery | Admitting: Neurosurgery

## 2010-12-29 DIAGNOSIS — M545 Low back pain, unspecified: Secondary | ICD-10-CM

## 2011-01-05 ENCOUNTER — Telehealth: Payer: Self-pay | Admitting: Family Medicine

## 2011-01-09 NOTE — Telephone Encounter (Signed)
Mailed 01/09/11     KP

## 2011-01-19 ENCOUNTER — Telehealth: Payer: Self-pay | Admitting: Family Medicine

## 2011-01-19 NOTE — Telephone Encounter (Signed)
Mailed    KP 

## 2011-01-19 NOTE — Telephone Encounter (Signed)
Patient requesting lab results be mailed to him at this address [address] [address]

## 2011-02-02 ENCOUNTER — Telehealth: Payer: Self-pay | Admitting: Family Medicine

## 2011-02-02 DIAGNOSIS — I10 Essential (primary) hypertension: Secondary | ICD-10-CM

## 2011-02-02 MED ORDER — CARVEDILOL 6.25 MG PO TABS: 6.2500 mg | ORAL_TABLET | Freq: Two times a day (BID) | ORAL | Status: DC

## 2011-02-02 MED ORDER — METFORMIN HCL ER 500 MG PO TB24: 1500.0000 mg | ORAL_TABLET | Freq: Every day | ORAL | Status: DC

## 2011-02-02 NOTE — Telephone Encounter (Signed)
Rx faxed to [facility] per Patient request    KP

## 2011-02-02 NOTE — Telephone Encounter (Signed)
Patient has new insurance [facility], please fax 3 month refills for metformin & carvedilol to [phone]. The pharmacy phone # is [phone]

## 2011-02-16 ENCOUNTER — Other Ambulatory Visit: Payer: Self-pay

## 2011-02-16 MED ORDER — GLUCOSE BLOOD VI STRP: ORAL_STRIP | Status: DC

## 2011-02-16 NOTE — Telephone Encounter (Signed)
Rx faxed.    KP 

## 2011-03-14 ENCOUNTER — Telehealth: Payer: Self-pay

## 2011-03-14 NOTE — Telephone Encounter (Signed)
lipitor 40 mg  #30  As directed  2 refills

## 2011-03-14 NOTE — Telephone Encounter (Signed)
Spoke with patient and he sated the Crestor 20 mg was too expensive and he wanted to switch to the Generic Lipitor 40 ms so he can break it in half. Please advise   KP

## 2011-03-15 MED ORDER — ATORVASTATIN CALCIUM 40 MG PO TABS: 40.0000 mg | ORAL_TABLET | ORAL | Status: DC

## 2011-03-15 NOTE — Telephone Encounter (Signed)
Discussed with patient and he want to hold the Rx until he got to  and then we can fax to the pharmacy there. I added Rx to the med list       KP

## 2011-06-11 ENCOUNTER — Telehealth: Payer: Self-pay | Admitting: Family Medicine

## 2011-06-11 MED ORDER — ATORVASTATIN CALCIUM 40 MG PO TABS: 40.0000 mg | ORAL_TABLET | ORAL | Status: DC

## 2011-06-11 NOTE — Telephone Encounter (Signed)
Pt states he needs [place] to fax a new rx for lipitor 40mg  to [facility]. He is scheduled for lab work on 06/20/11.

## 2011-06-11 NOTE — Telephone Encounter (Signed)
Faxed.   KP 

## 2011-06-19 ENCOUNTER — Other Ambulatory Visit (INDEPENDENT_AMBULATORY_CARE_PROVIDER_SITE_OTHER): Payer: PRIVATE HEALTH INSURANCE

## 2011-06-19 DIAGNOSIS — E119 Type 2 diabetes mellitus without complications: Secondary | ICD-10-CM

## 2011-06-19 DIAGNOSIS — E785 Hyperlipidemia, unspecified: Secondary | ICD-10-CM

## 2011-06-19 LAB — LIPID PANEL
Cholesterol: 93 mg/dL (ref 0–200)
HDL: 29.2 mg/dL — ABNORMAL LOW (ref >39.00)
LDL Cholesterol: 44 mg/dL (ref 0–99)
Total CHOL/HDL Ratio: 3
Triglycerides: 101 mg/dL (ref 0.0–149.0)
VLDL: 20.2 mg/dL (ref 0.0–40.0)

## 2011-06-19 LAB — HEPATIC FUNCTION PANEL
ALT: 23 U/L (ref 0–53)
AST: 24 U/L (ref 0–37)
Albumin: 4.2 g/dL (ref 3.5–5.2)
Alkaline Phosphatase: 39 U/L (ref 39–117)
Bilirubin, Direct: 0 mg/dL (ref 0.0–0.3)
Total Bilirubin: 0.4 mg/dL (ref 0.3–1.2)
Total Protein: 6.9 g/dL (ref 6.0–8.3)

## 2011-06-19 LAB — BASIC METABOLIC PANEL
BUN: 9 mg/dL (ref 6–23)
Chloride: 103 mEq/L (ref 96–112)
Glucose, Bld: 126 mg/dL — ABNORMAL HIGH (ref 70–99)
Potassium: 4.1 mEq/L (ref 3.5–5.1)

## 2011-06-19 LAB — HEMOGLOBIN A1C: Hgb A1c MFr Bld: 7.2 % — ABNORMAL HIGH (ref 4.6–6.5)

## 2011-06-19 NOTE — Progress Notes (Signed)
Labs only

## 2011-06-20 ENCOUNTER — Other Ambulatory Visit: Payer: PRIVATE HEALTH INSURANCE

## 2011-06-22 ENCOUNTER — Encounter: Payer: Self-pay | Admitting: *Deleted

## 2011-06-26 ENCOUNTER — Telehealth: Payer: Self-pay | Admitting: *Deleted

## 2011-06-26 NOTE — Telephone Encounter (Signed)
Pt check with insurance and Janumet or kombiglyza are both tier 3. However Pt states that in the past we signed him up for Pt assistance which help with the cost of the Janumet. Pt would like to know if this is possible again for [place] to sign him up for Pt assistance to help with the cost of med. .Please advise

## 2011-06-26 NOTE — Telephone Encounter (Signed)
Yes-- we can give him samples until that is done

## 2011-06-28 MED ORDER — SITAGLIPTIN PHOS-METFORMIN HCL 50-1000 MG PO TABS: 1.0000 | ORAL_TABLET | Freq: Two times a day (BID) | ORAL | Status: DC

## 2011-06-28 NOTE — Telephone Encounter (Signed)
Per Dr.Lowne patient is to start Janumet 50-1000 po bid. Samples left at check in along with patient assistance form and patient has been made aware.     KP

## 2011-06-28 NOTE — Telephone Encounter (Signed)
hgba1c creeping up again. Pt should check to see if Janumet or Marissa Calamity is now on his formulary. con't diet and exercise. Rest of labs are great. Recheck 3 months------250.00 272.4 Lipid, hep, bmp, hgba1c, microalbumin    Both medications are Tier 3--- Which samples did you want him to have and which medication would you like for him to have so we can get the patient assistance complete.     KP

## 2011-06-28 NOTE — Telephone Encounter (Signed)
janumet 

## 2011-06-28 NOTE — Telephone Encounter (Signed)
msg left to call the office     KP 

## 2011-08-01 ENCOUNTER — Telehealth: Payer: Self-pay

## 2011-08-01 NOTE — Telephone Encounter (Signed)
Call to patient to make him aware his patient assistance application was not approved due to him having prescription coverage. I advised I would mail him a copy of the letter because it tells him how to apply for the exception program. He agreed and wanted me to mail the information so he could follow up.  Paperwork mailed and a copy has been sent to be scanned  KP

## 2011-08-22 ENCOUNTER — Ambulatory Visit (INDEPENDENT_AMBULATORY_CARE_PROVIDER_SITE_OTHER): Payer: Medicare Other | Admitting: Family Medicine

## 2011-08-22 DIAGNOSIS — I10 Essential (primary) hypertension: Secondary | ICD-10-CM

## 2011-08-22 MED ORDER — CARVEDILOL 6.25 MG PO TABS: 6.2500 mg | ORAL_TABLET | Freq: Two times a day (BID) | ORAL | Status: DC

## 2011-08-22 MED ORDER — GLUCOSE BLOOD VI STRP: ORAL_STRIP | Status: DC

## 2011-08-23 NOTE — Progress Notes (Signed)
  Subjective:    Patient ID: Eddie Mcdaniel, male    DOB: 1940-04-16, 71 y.o.   MRN: [id number]  HPI Pt here to see cma about pt assistance programs   Review of Systems     Objective:   Physical Exam        Assessment & Plan:

## 2011-09-04 ENCOUNTER — Telehealth: Payer: Self-pay

## 2011-09-04 NOTE — Telephone Encounter (Signed)
Msg from patient wanting to discuss Janumet. I tried to call the patient back but his line was busy.      KP

## 2011-09-05 ENCOUNTER — Other Ambulatory Visit: Payer: Self-pay

## 2011-09-05 MED ORDER — METFORMIN HCL ER 750 MG PO TB24: 750.0000 mg | ORAL_TABLET | Freq: Two times a day (BID) | ORAL | Status: DC

## 2011-09-05 NOTE — Telephone Encounter (Signed)
Patient came into the office and request Metformin be sent to [facility] instead of picking it up.   Rx faxed   KP

## 2011-09-05 NOTE — Telephone Encounter (Signed)
Tried calling the patient and the line was busy   KP

## 2011-09-05 NOTE — Telephone Encounter (Signed)
Spoke with patient and he stated he is taking the Janumet and his blood sugars are dropping too low, he said since he has been taking it his leg are also hurting an would like to go back the Metformin. His blood sugars today was 110. Please advise     KP

## 2011-09-05 NOTE — Telephone Encounter (Signed)
Call to [facility] to cancel the metformin that ws sent to [facility]-- [place] said when it comes through they will cancel the RX, a new Rx was printed because the patient requested to pick it up in the office    KP

## 2011-09-05 NOTE — Telephone Encounter (Signed)
Change to metformin xr 750mg   2 po qd  #60  2 refils

## 2011-09-17 ENCOUNTER — Other Ambulatory Visit (INDEPENDENT_AMBULATORY_CARE_PROVIDER_SITE_OTHER): Payer: Medicare Other

## 2011-09-17 DIAGNOSIS — E785 Hyperlipidemia, unspecified: Secondary | ICD-10-CM

## 2011-09-17 DIAGNOSIS — E119 Type 2 diabetes mellitus without complications: Secondary | ICD-10-CM

## 2011-09-17 LAB — HEPATIC FUNCTION PANEL
ALT: 20 U/L (ref 0–53)
Albumin: 4.2 g/dL (ref 3.5–5.2)
Alkaline Phosphatase: 44 U/L (ref 39–117)
Bilirubin, Direct: 0.1 mg/dL (ref 0.0–0.3)
Total Protein: 7 g/dL (ref 6.0–8.3)

## 2011-09-17 LAB — MICROALBUMIN / CREATININE URINE RATIO
Creatinine,U: 150.6 mg/dL
Microalb, Ur: 4.5 mg/dL — ABNORMAL HIGH (ref 0.0–1.9)

## 2011-09-17 LAB — HEMOGLOBIN A1C: Hgb A1c MFr Bld: 6.3 % (ref 4.6–6.5)

## 2011-09-17 LAB — LIPID PANEL
Cholesterol: 91 mg/dL (ref 0–200)
LDL Cholesterol: 41 mg/dL (ref 0–99)
Triglycerides: 87 mg/dL (ref 0.0–149.0)

## 2011-09-17 LAB — BASIC METABOLIC PANEL
BUN: 9 mg/dL (ref 6–23)
Calcium: 9.1 mg/dL (ref 8.4–10.5)
Chloride: 103 mEq/L (ref 96–112)
Creatinine, Ser: 0.7 mg/dL (ref 0.4–1.5)
GFR: 127.07 mL/min (ref >60.00)

## 2011-12-19 ENCOUNTER — Other Ambulatory Visit (INDEPENDENT_AMBULATORY_CARE_PROVIDER_SITE_OTHER): Payer: Medicare Other

## 2011-12-19 DIAGNOSIS — IMO0002 Reserved for concepts with insufficient information to code with codable children: Secondary | ICD-10-CM

## 2011-12-19 DIAGNOSIS — Z79899 Other long term (current) drug therapy: Secondary | ICD-10-CM

## 2011-12-19 DIAGNOSIS — E785 Hyperlipidemia, unspecified: Secondary | ICD-10-CM

## 2011-12-19 LAB — HEMOGLOBIN A1C: Hgb A1c MFr Bld: 6.2 % (ref 4.6–6.5)

## 2011-12-19 LAB — BASIC METABOLIC PANEL
CO2: 30 mEq/L (ref 19–32)
Chloride: 99 mEq/L (ref 96–112)
Creatinine, Ser: 0.7 mg/dL (ref 0.4–1.5)

## 2011-12-19 LAB — LIPID PANEL
HDL: 32.1 mg/dL — ABNORMAL LOW (ref >39.00)
Total CHOL/HDL Ratio: 6

## 2011-12-19 LAB — HEPATIC FUNCTION PANEL
AST: 23 U/L (ref 0–37)
Alkaline Phosphatase: 48 U/L (ref 39–117)
Total Bilirubin: 0.7 mg/dL (ref 0.3–1.2)

## 2011-12-21 ENCOUNTER — Ambulatory Visit (INDEPENDENT_AMBULATORY_CARE_PROVIDER_SITE_OTHER): Payer: Medicare Other | Admitting: Family Medicine

## 2011-12-21 ENCOUNTER — Encounter: Payer: Self-pay | Admitting: Family Medicine

## 2011-12-21 VITALS — BP 146/80 | HR 59 | Temp 98.2°F | Ht 65.0 in | Wt 154.6 lb

## 2011-12-21 DIAGNOSIS — R972 Elevated prostate specific antigen [PSA]: Secondary | ICD-10-CM

## 2011-12-21 DIAGNOSIS — Z23 Encounter for immunization: Secondary | ICD-10-CM

## 2011-12-21 DIAGNOSIS — E785 Hyperlipidemia, unspecified: Secondary | ICD-10-CM

## 2011-12-21 DIAGNOSIS — E119 Type 2 diabetes mellitus without complications: Secondary | ICD-10-CM

## 2011-12-21 DIAGNOSIS — I10 Essential (primary) hypertension: Secondary | ICD-10-CM

## 2011-12-21 DIAGNOSIS — Z0000 Encounter for general adult medical examination without abnormal findings: Secondary | ICD-10-CM

## 2011-12-21 LAB — CBC WITH DIFFERENTIAL/PLATELET
Basophils Absolute: 0 10*3/uL (ref 0.0–0.1)
Eosinophils Absolute: 0.2 10*3/uL (ref 0.0–0.7)
Hemoglobin: 13.2 g/dL (ref 13.0–17.0)
Lymphocytes Relative: 30 % (ref 12.0–46.0)
MCHC: 32.9 g/dL (ref 30.0–36.0)
Monocytes Relative: 6.8 % (ref 3.0–12.0)
Neutrophils Relative %: 59.8 % (ref 43.0–77.0)
Platelets: 165 10*3/uL (ref 150.0–400.0)
RDW: 13.3 % (ref 11.5–14.6)

## 2011-12-21 LAB — PSA: PSA: 4.73 ng/mL — ABNORMAL HIGH (ref 0.10–4.00)

## 2011-12-21 MED ORDER — ATORVASTATIN CALCIUM 10 MG PO TABS: 10.0000 mg | ORAL_TABLET | Freq: Every day | ORAL | Status: DC

## 2011-12-21 MED ORDER — ZOSTER VACCINE LIVE 19400 UNT/0.65ML ~~LOC~~ SOLR: 0.6500 mL | Freq: Once | SUBCUTANEOUS | Status: DC

## 2011-12-21 NOTE — Assessment & Plan Note (Signed)
Check labs con't meds 

## 2011-12-21 NOTE — Patient Instructions (Addendum)
Preventive Care for Adults, Male A healthy lifestyle and preventive care can promote health and wellness. Preventive health guidelines for men include the following key practices:  A routine yearly physical is a good way to check with your caregiver about your health and preventative screening. It is a chance to share any concerns and updates on your health, and to receive a thorough exam.  Visit your dentist for a routine exam and preventative care every 6 months. Brush your teeth twice a day and floss once a day. Good oral hygiene prevents tooth decay and gum disease.  The frequency of eye exams is based on your age, health, family medical history, use of contact lenses, and other factors. Follow your caregiver's recommendations for frequency of eye exams.  Eat a healthy diet. Foods like vegetables, fruits, whole grains, low-fat dairy products, and lean protein foods contain the nutrients you need without too many calories. Decrease your intake of foods high in solid fats, added sugars, and salt. Eat the right amount of calories for you.Get information about a proper diet from your caregiver, if necessary.  Regular physical exercise is one of the most important things you can do for your health. Most adults should get at least 150 minutes of moderate-intensity exercise (any activity that increases your heart rate and causes you to sweat) each week. In addition, most adults need muscle-strengthening exercises on 2 or more days a week.  Maintain a healthy weight. The body mass index (BMI) is a screening tool to identify possible weight problems. It provides an estimate of body fat based on height and weight. Your caregiver can help determine your BMI, and can help you achieve or maintain a healthy weight.For adults 20 years and older:  A BMI below 18.5 is considered underweight.  A BMI of 18.5 to 24.9 is normal.  A BMI of 25 to 29.9 is considered overweight.  A BMI of 30 and above is  considered obese.  Maintain normal blood lipids and cholesterol levels by exercising and minimizing your intake of saturated fat. Eat a balanced diet with plenty of fruit and vegetables. Blood tests for lipids and cholesterol should begin at age 20 and be repeated every 5 years. If your lipid or cholesterol levels are high, you are over 50, or you are a high risk for heart disease, you may need your cholesterol levels checked more frequently.Ongoing high lipid and cholesterol levels should be treated with medicines if diet and exercise are not effective.  If you smoke, find out from your caregiver how to quit. If you do not use tobacco, do not start.  If you choose to drink alcohol, do not exceed 2 drinks per day. One drink is considered to be 12 ounces (355 mL) of beer, 5 ounces (148 mL) of wine, or 1.5 ounces (44 mL) of liquor.  Avoid use of street drugs. Do not share needles with anyone. Ask for help if you need support or instructions about stopping the use of drugs.  High blood pressure causes heart disease and increases the risk of stroke. Your blood pressure should be checked at least every 1 to 2 years. Ongoing high blood pressure should be treated with medicines, if weight loss and exercise are not effective.  If you are 45 to 69 years old, ask your caregiver if you should take aspirin to prevent heart disease.  Diabetes screening involves taking a blood sample to check your fasting blood sugar level. This should be done once every 3 years,   after age 45, if you are within normal weight and without risk factors for diabetes. Testing should be considered at a younger age or be carried out more frequently if you are overweight and have at least 1 risk factor for diabetes.  Colorectal cancer can be detected and often prevented. Most routine colorectal cancer screening begins at the age of 50 and continues through age 75. However, your caregiver may recommend screening at an earlier age if you  have risk factors for colon cancer. On a yearly basis, your caregiver may provide home test kits to check for hidden blood in the stool. Use of a small camera at the end of a tube, to directly examine the colon (sigmoidoscopy or colonoscopy), can detect the earliest forms of colorectal cancer. Talk to your caregiver about this at age 50, when routine screening begins. Direct examination of the colon should be repeated every 5 to 10 years through age 75, unless early forms of pre-cancerous polyps or small growths are found.  Hepatitis C blood testing is recommended for all people born from 1945 through 1965 and any individual with known risks for hepatitis C.  Practice safe sex. Use condoms and avoid high-risk sexual practices to reduce the spread of sexually transmitted infections (STIs). STIs include gonorrhea, chlamydia, syphilis, trichomonas, herpes, HPV, and human immunodeficiency virus (HIV). Herpes, HIV, and HPV are viral illnesses that have no cure. They can result in disability, cancer, and death.  A one-time screening for abdominal aortic aneurysm (AAA) and surgical repair of large AAAs by sound wave imaging (ultrasonography) is recommended for ages 65 to 75 years who are current or former smokers.  Healthy men should no longer receive prostate-specific antigen (PSA) blood tests as part of routine cancer screening. Consult with your caregiver about prostate cancer screening.  Testicular cancer screening is not recommended for adult males who have no symptoms. Screening includes self-exam, caregiver exam, and other screening tests. Consult with your caregiver about any symptoms you have or any concerns you have about testicular cancer.  Use sunscreen with skin protection factor (SPF) of 30 or more. Apply sunscreen liberally and repeatedly throughout the day. You should seek shade when your shadow is shorter than you. Protect yourself by wearing long sleeves, pants, a wide-brimmed hat, and  sunglasses year round, whenever you are outdoors.  Once a month, do a whole body skin exam, using a mirror to look at the skin on your back. Notify your caregiver of new moles, moles that have irregular borders, moles that are larger than a pencil eraser, or moles that have changed in shape or color.  Stay current with required immunizations.  Influenza. You need a dose every fall (or winter). The composition of the flu vaccine changes each year, so being vaccinated once is not enough.  Pneumococcal polysaccharide. You need 1 to 2 doses if you smoke cigarettes or if you have certain chronic medical conditions. You need 1 dose at age 65 (or older) if you have never been vaccinated.  Tetanus, diphtheria, pertussis (Tdap, Td). Get 1 dose of Tdap vaccine if you are younger than age 65 years, are over 65 and have contact with an infant, are a healthcare worker, or simply want to be protected from whooping cough. After that, you need a Td booster dose every 10 years. Consult your caregiver if you have not had at least 3 tetanus and diphtheria-containing shots sometime in your life or have a deep or dirty wound.  HPV. This vaccine is recommended   for males 13 through 69 years of age. This vaccine may be given to men 22 through 69 years of age who have not completed the 3 dose series. It is recommended for men through age 26 who have sex with men or whose immune system is weakened because of HIV infection, other illness, or medications. The vaccine is given in 3 doses over 6 months.  Measles, mumps, rubella (MMR). You need at least 1 dose of MMR if you were born in 1957 or later. You may also need a 2nd dose.  Meningococcal. If you are age 19 to 21 years and a first-year college student living in a residence hall, or have one of several medical conditions, you need to get vaccinated against meningococcal disease. You may also need additional booster doses.  Zoster (shingles). If you are age 60 years or  older, you should get this vaccine.  Varicella (chickenpox). If you have never had chickenpox or you were vaccinated but received only 1 dose, talk to your caregiver to find out if you need this vaccine.  Hepatitis A. You need this vaccine if you have a specific risk factor for hepatitis A virus infection, or you simply wish to be protected from this disease. The vaccine is usually given as 2 doses, 6 to 18 months apart.  Hepatitis B. You need this vaccine if you have a specific risk factor for hepatitis B virus infection or you simply wish to be protected from this disease. The vaccine is given in 3 doses, usually over 6 months. Preventative Service / Frequency Ages 19 to 39  Blood pressure check.** / Every 1 to 2 years.  Lipid and cholesterol check.** / Every 5 years beginning at age 20.  Hepatitis C blood test.** / For any individual with known risks for hepatitis C.  Skin self-exam. / Monthly.  Influenza immunization.** / Every year.  Pneumococcal polysaccharide immunization.** / 1 to 2 doses if you smoke cigarettes or if you have certain chronic medical conditions.  Tetanus, diphtheria, pertussis (Tdap,Td) immunization. / A one-time dose of Tdap vaccine. After that, you need a Td booster dose every 10 years.  HPV immunization. / 3 doses over 6 months, if 26 and younger.  Measles, mumps, rubella (MMR) immunization. / You need at least 1 dose of MMR if you were born in 1957 or later. You may also need a 2nd dose.  Meningococcal immunization. / 1 dose if you are age 19 to 21 years and a first-year college student living in a residence hall, or have one of several medical conditions, you need to get vaccinated against meningococcal disease. You may also need additional booster doses.  Varicella immunization.** / Consult your caregiver.  Hepatitis A immunization.** / Consult your caregiver. 2 doses, 6 to 18 months apart.  Hepatitis B immunization.** / Consult your caregiver. 3 doses  usually over 6 months. Ages 40 to 64  Blood pressure check.** / Every 1 to 2 years.  Lipid and cholesterol check.** / Every 5 years beginning at age 20.  Fecal occult blood test (FOBT) of stool. / Every year beginning at age 50 and continuing until age 75. You may not have to do this test if you get colonoscopy every 10 years.  Flexible sigmoidoscopy** or colonoscopy.** / Every 5 years for a flexible sigmoidoscopy or every 10 years for a colonoscopy beginning at age 50 and continuing until age 75.  Hepatitis C blood test.** / For all people born from 1945 through 1965 and any   individual with known risks for hepatitis C.  Skin self-exam. / Monthly.  Influenza immunization.** / Every year.  Pneumococcal polysaccharide immunization.** / 1 to 2 doses if you smoke cigarettes or if you have certain chronic medical conditions.  Tetanus, diphtheria, pertussis (Tdap/Td) immunization.** / A one-time dose of Tdap vaccine. After that, you need a Td booster dose every 10 years.  Measles, mumps, rubella (MMR) immunization. / You need at least 1 dose of MMR if you were born in 1957 or later. You may also need a 2nd dose.  Varicella immunization.**/ Consult your caregiver.  Meningococcal immunization.** / Consult your caregiver.  Hepatitis A immunization.** / Consult your caregiver. 2 doses, 6 to 18 months apart.  Hepatitis B immunization.** / Consult your caregiver. 3 doses, usually over 6 months. Ages 65 and over  Blood pressure check.** / Every 1 to 2 years.  Lipid and cholesterol check.**/ Every 5 years beginning at age 20.  Fecal occult blood test (FOBT) of stool. / Every year beginning at age 50 and continuing until age 75. You may not have to do this test if you get colonoscopy every 10 years.  Flexible sigmoidoscopy** or colonoscopy.** / Every 5 years for a flexible sigmoidoscopy or every 10 years for a colonoscopy beginning at age 50 and continuing until age 75.  Hepatitis C blood  test.** / For all people born from 1945 through 1965 and any individual with known risks for hepatitis C.  Abdominal aortic aneurysm (AAA) screening.** / A one-time screening for ages 65 to 75 years who are current or former smokers.  Skin self-exam. / Monthly.  Influenza immunization.** / Every year.  Pneumococcal polysaccharide immunization.** / 1 dose at age 65 (or older) if you have never been vaccinated.  Tetanus, diphtheria, pertussis (Tdap, Td) immunization. / A one-time dose of Tdap vaccine if you are over 65 and have contact with an infant, are a healthcare worker, or simply want to be protected from whooping cough. After that, you need a Td booster dose every 10 years.  Varicella immunization. ** / Consult your caregiver.  Meningococcal immunization.** / Consult your caregiver.  Hepatitis A immunization. ** / Consult your caregiver. 2 doses, 6 to 18 months apart.  Hepatitis B immunization.** / Check with your caregiver. 3 doses, usually over 6 months. **Family history and personal history of risk and conditions may change your caregiver's recommendations. Document Released: 03/13/2001 Document Revised: 04/09/2011 Document Reviewed: 06/12/2010 [employer] Patient Information 2013 [employer].  

## 2011-12-21 NOTE — Assessment & Plan Note (Signed)
Check labs Cont' meds 

## 2011-12-21 NOTE — Assessment & Plan Note (Signed)
Labs reviewed with pt con't meds  

## 2011-12-21 NOTE — Progress Notes (Signed)
Subjective:    Eddie Mcdaniel is a 71 y.o. male who presents for Medicare Annual/Subsequent preventive examination.   Preventive Screening-Counseling & Management  Tobacco History  Smoking status  . Never Smoker   Smokeless tobacco  . Former Neurosurgeon  . Types: Chew  . Quit date: 04/30/2010    Problems Prior to Visit 1.   Current Problems (verified) Patient Active Problem List  Diagnosis  . DIABETES MELLITUS, TYPE II  . HYPERLIPIDEMIA  . HYPERTENSION  . ACUTE SINUSITIS, UNSPECIFIED  . ACUTE PHARYNGITIS  . URI  . PNEUMONIA, BILATERAL  . GERD  . CONTACT DERMATITIS  . COUGH  . ELEVATED PROSTATE SPECIFIC ANTIGEN  . Nonspecific (abnormal) findings on radiological and other examination of body structure  . COMPUTERIZED TOMOGRAPHY, CHEST, ABNORMAL  . Hip pain, left    Medications Prior to Visit Current Outpatient Prescriptions on File Prior to Visit  Medication Sig Dispense Refill  . aspirin 81 MG chewable tablet Chew 81 mg by mouth daily.        . carvedilol (COREG) 6.25 MG tablet Take 1 tablet (6.25 mg total) by mouth 2 (two) times daily with a meal.  180 tablet  1  . fish oil-omega-3 fatty acids 1000 MG capsule Take 1 g by mouth daily.        Marland Kitchen glucose blood (BAYER CONTOUR TEST) test strip Check blood sugar once a day  100 each  11  . metFORMIN (GLUCOPHAGE XR) 750 MG 24 hr tablet Take 1 tablet (750 mg total) by mouth 2 (two) times daily.  180 tablet  1  . multivitamin-iron-minerals-folic acid (CENTRUM) chewable tablet Chew 1 tablet by mouth 2 (two) times daily.        . [DISCONTINUED] atorvastatin (LIPITOR) 40 MG tablet Take 1 tablet (40 mg total) by mouth as directed.  90 tablet  0    Current Medications (verified) Current Outpatient Prescriptions  Medication Sig Dispense Refill  . aspirin 81 MG chewable tablet Chew 81 mg by mouth daily.        . carvedilol (COREG) 6.25 MG tablet Take 1 tablet (6.25 mg total) by mouth 2 (two) times daily with a meal.  180 tablet  1  .  fish oil-omega-3 fatty acids 1000 MG capsule Take 1 g by mouth daily.        Marland Kitchen glucose blood (BAYER CONTOUR TEST) test strip Check blood sugar once a day  100 each  11  . metFORMIN (GLUCOPHAGE XR) 750 MG 24 hr tablet Take 1 tablet (750 mg total) by mouth 2 (two) times daily.  180 tablet  1  . multivitamin-iron-minerals-folic acid (CENTRUM) chewable tablet Chew 1 tablet by mouth 2 (two) times daily.        Marland Kitchen atorvastatin (LIPITOR) 10 MG tablet Take 1 tablet (10 mg total) by mouth daily.  90 tablet  3  . zoster vaccine live, PF, (ZOSTAVAX) 95621 UNT/0.65ML injection Inject 19,400 Units into the skin once.  1 vial  0  . [DISCONTINUED] atorvastatin (LIPITOR) 40 MG tablet Take 1 tablet (40 mg total) by mouth as directed.  90 tablet  0     Allergies (verified) Review of patient's allergies indicates no known allergies.   PAST HISTORY  Family History Family History  Problem Relation Age of Onset  . Diabetes Mother   . Hypertension Mother   . Cancer Brother 65    brain, stomach  . Hyperlipidemia Brother   . Heart disease Brother     MI  .  Heart disease Brother     MI  . Diabetes Brother   . Stroke Brother   . Hypertension Brother     Social History History  Substance Use Topics  . Smoking status: Never Smoker   . Smokeless tobacco: Former Neurosurgeon    Types: Chew    Quit date: 04/30/2010  . Alcohol Use: No    Are there smokers in your home (other than you)?  No  Risk Factors Current exercise habits: walking  Dietary issues discussed: na   Cardiac risk factors: advanced age (older than 31 for men, 42 for women), diabetes mellitus, dyslipidemia, hypertension and male gender.  Depression Screen (Note: if answer to either of the following is "Yes", a more complete depression screening is indicated)   Q1: Over the past two weeks, have you felt down, depressed or hopeless? No  Q2: Over the past two weeks, have you felt little interest or pleasure in doing things? No  Have you lost  interest or pleasure in daily life? No  Do you often feel hopeless? No  Do you cry easily over simple problems? No  Activities of Daily Living In your present state of health, do you have any difficulty performing the following activities?:  Driving? No Managing money?  No Feeding yourself? No Getting from bed to chair? No Climbing a flight of stairs? No Preparing food and eating?: No Bathing or showering? No Getting dressed: No Getting to the toilet? No Using the toilet:No Moving around from place to place: No In the past year have you fallen or had a near fall?:No   Are you sexually active?  Yes  Do you have more than one partner?  No  Hearing Difficulties: No Do you often ask people to speak up or repeat themselves? No Do you experience ringing or noises in your ears? No Do you have difficulty understanding soft or whispered voices? No   Do you feel that you have a problem with memory? No  Do you often misplace items? No  Do you feel safe at home?  No  Cognitive Testing  Alert? Yes  Normal Appearance?Yes  Oriented to person? Yes  Place? Yes   Time? Yes  Recall of three objects?  Yes  Can perform simple calculations? Yes  Displays appropriate judgment?Yes  Can read the correct time from a watch face?Yes   Advanced Directives have been discussed with the patient? Yes   List the Names of Other Physician/Practitioners you currently use: 1.  oph--Grout 2  Dentist--no  3  NS-- [facility]zer  Indicate any recent Medical Services you may have received from other than [facility] providers in the past year (date may be approximate).  Immunization History  Administered Date(s) Administered  . Influenza Split 10/30/2010  . Influenza Whole 11/18/2009  . Pneumococcal Polysaccharide 10/24/2006    Screening Tests Health Maintenance  Topic Date Due  . Tetanus/tdap  05/14/1961  . Zostavax  05/15/2002  . Pneumococcal Polysaccharide Vaccine Age 46 And Over  05/15/2007  . Colonoscopy   07/27/2009  . Influenza Vaccine  09/29/2012    All answers were reviewed with the patient and necessary referrals were made:  Loreen Freud, DO   12/21/2011   History reviewed:  He  has a past medical history of GERD (gastroesophageal reflux disease); Hyperlipidemia; Hypertension; and Diabetes mellitus. He  does not have any pertinent problems on file. He  has past surgical history that includes Shoulder arthroscopy w/ rotator cuff repair and Eye surgery (2005). His family  history includes Cancer (age of onset:65) in his brother; Diabetes in his brother and mother; Heart disease in his brothers; Hyperlipidemia in his brother; Hypertension in his brother and mother; and Stroke in his brother. He  reports that he has never smoked. He quit smokeless tobacco use about 19 months ago. His smokeless tobacco use included Chew. He reports that he does not drink alcohol or use illicit drugs. He has a current medication list which includes the following prescription(s): aspirin, carvedilol, fish oil-omega-3 fatty acids, glucose blood, metformin, multivitamin-iron-minerals-folic acid, atorvastatin, and zoster vaccine live (pf). Current Outpatient Prescriptions on File Prior to Visit  Medication Sig Dispense Refill  . aspirin 81 MG chewable tablet Chew 81 mg by mouth daily.        . carvedilol (COREG) 6.25 MG tablet Take 1 tablet (6.25 mg total) by mouth 2 (two) times daily with a meal.  180 tablet  1  . fish oil-omega-3 fatty acids 1000 MG capsule Take 1 g by mouth daily.        Marland Kitchen glucose blood (BAYER CONTOUR TEST) test strip Check blood sugar once a day  100 each  11  . metFORMIN (GLUCOPHAGE XR) 750 MG 24 hr tablet Take 1 tablet (750 mg total) by mouth 2 (two) times daily.  180 tablet  1  . multivitamin-iron-minerals-folic acid (CENTRUM) chewable tablet Chew 1 tablet by mouth 2 (two) times daily.        . [DISCONTINUED] atorvastatin (LIPITOR) 40 MG tablet Take 1 tablet (40 mg total) by mouth as directed.  90  tablet  0   He  has no known allergies.  Review of Systems  Review of Systems  Constitutional: Negative for activity change, appetite change and fatigue.  HENT: Negative for hearing loss, congestion, tinnitus and ear discharge.   Eyes: Negative for visual disturbance (see optho q1y -- vision corrected to 20/20 with glasses).  Respiratory: Negative for cough, chest tightness and shortness of breath.   Cardiovascular: Negative for chest pain, palpitations and leg swelling.  Gastrointestinal: Negative for abdominal pain, diarrhea, constipation and abdominal distention.  Genitourinary: Negative for urgency, frequency, decreased urine volume and difficulty urinating.  Musculoskeletal: Negative for back pain, arthralgias and gait problem.  Skin: Negative for color change, pallor and rash.  Neurological: Negative for dizziness, light-headedness, numbness and headaches.  Hematological: Negative for adenopathy. Does not bruise/bleed easily.  Psychiatric/Behavioral: Negative for suicidal ideas, confusion, sleep disturbance, self-injury, dysphoric mood, decreased concentration and agitation.  Pt is able to read and write and can do all ADLs No risk for falling No abuse/ violence in home     Objective:     Vision by Snellen chart: opth  Blood pressure 146/80, pulse 59, temperature 98.2 F (36.8 C), temperature source Oral, height 5\' 5"  (1.651 m), weight 154 lb 9.6 oz (70.126 kg), SpO2 98.00%. Body mass index is 25.73 kg/(m^2).  BP 146/80  Pulse 59  Temp 98.2 F (36.8 C) (Oral)  Ht 5\' 5"  (1.651 m)  Wt 154 lb 9.6 oz (70.126 kg)  BMI 25.73 kg/m2  SpO2 98% General appearance: alert, cooperative, appears stated age and no distress Head: Normocephalic, without obvious abnormality, atraumatic Eyes: conjunctivae/corneas clear. PERRL, EOM's intact. Fundi benign. Ears: normal TM's and external ear canals both ears Nose: Nares normal. Septum midline. Mucosa normal. No drainage or sinus  tenderness. Throat: lips, mucosa, and tongue normal; teeth and gums normal Neck: no adenopathy, no carotid bruit, no JVD, supple, symmetrical, trachea midline and thyroid not enlarged, symmetric,  no tenderness/mass/nodules Back: symmetric, no curvature. ROM normal. No CVA tenderness. Lungs: clear to auscultation bilaterally Chest wall: no tenderness Heart: regular rate and rhythm, S1, S2 normal, no murmur, click, rub or gallop Abdomen: soft, non-tender; bowel sounds normal; no masses,  no organomegaly Male genitalia: normal Rectal: normal tone, normal prostate, no masses or tenderness Extremities: extremities normal, atraumatic, no cyanosis or edema Pulses: 2+ and symmetric Skin: Skin color, texture, turgor normal. No rashes or lesions Lymph nodes: Cervical, supraclavicular, and axillary nodes normal. Neurologic: Alert and oriented X 3, normal strength and tone. Normal symmetric reflexes. Normal coordination and gait Psych-no anxiety, no depression      Assessment:     cpe     Plan:     During the course of the visit the patient was educated and counseled about appropriate screening and preventive services including:    Pneumococcal vaccine   Influenza vaccine  Prostate cancer screening  Colorectal cancer screening  Diabetes screening  Glaucoma screening  Nutrition counseling   Smoking cessation counseling  Advanced directives: has an advanced directive - a copy HAS NOT been provided.  Diet review for nutrition referral? Yes ____  Not Indicated __x__   Patient Instructions (the written plan) was given to the patient.  Medicare Attestation I have personally reviewed: The patient's medical and social history Their use of alcohol, tobacco or illicit drugs Their current medications and supplements The patient's functional ability including ADLs,fall risks, home safety risks, cognitive, and hearing and visual impairment Diet and physical activities Evidence for  depression or mood disorders  The patient's weight, height, BMI, and visual acuity have been recorded in the chart.  I have made referrals, counseling, and provided education to the patient based on review of the above and I have provided the patient with a written personalized care plan for preventive services.     Loreen Freud, DO   12/21/2011

## 2012-01-04 ENCOUNTER — Telehealth: Payer: Self-pay | Admitting: Family Medicine

## 2012-01-04 NOTE — Telephone Encounter (Signed)
Labs mailed to the address below.     KP

## 2012-01-04 NOTE — Telephone Encounter (Signed)
Pt called would like last CPE & Labs mailed to [place] Address [address] [place] [address]

## 2012-02-29 ENCOUNTER — Telehealth: Payer: Self-pay | Admitting: Family Medicine

## 2012-02-29 DIAGNOSIS — I10 Essential (primary) hypertension: Secondary | ICD-10-CM

## 2012-02-29 MED ORDER — CARVEDILOL 6.25 MG PO TABS: 6.2500 mg | ORAL_TABLET | Freq: Two times a day (BID) | ORAL | Status: DC

## 2012-02-29 MED ORDER — METFORMIN HCL ER 750 MG PO TB24: 750.0000 mg | ORAL_TABLET | Freq: Two times a day (BID) | ORAL | Status: DC

## 2012-02-29 NOTE — Telephone Encounter (Signed)
Patient states he needs 1 year supply of diabetes and BP medications send to [facility]. He would like Kim to call him back at [phone].

## 2012-02-29 NOTE — Telephone Encounter (Signed)
Patient wanted to have his medications sent to [facility] and wanted them to be mailed to his [place] address. I advised the patient he would need to call [facility] and advised them where to send the Rx. He agreed to do so.      KP

## 2012-02-29 NOTE — Addendum Note (Signed)
Addended by: Arnette Norris on: 02/29/2012 04:57 PM   Modules accepted: Orders

## 2012-03-26 ENCOUNTER — Telehealth: Payer: Self-pay

## 2012-03-26 NOTE — Telephone Encounter (Signed)
Call from patient and he wanted to know should he go to the Urologist. I made his aware yes since his PSA has been gradually getting higher. He voiced understansing and said he just wanted to make sure.     KP

## 2012-04-09 ENCOUNTER — Ambulatory Visit (INDEPENDENT_AMBULATORY_CARE_PROVIDER_SITE_OTHER): Payer: Medicare Other | Admitting: Family Medicine

## 2012-04-09 ENCOUNTER — Encounter: Payer: Self-pay | Admitting: Family Medicine

## 2012-04-09 VITALS — BP 150/82 | HR 76 | Temp 97.6°F | Wt 154.0 lb

## 2012-04-09 DIAGNOSIS — E119 Type 2 diabetes mellitus without complications: Secondary | ICD-10-CM

## 2012-04-09 DIAGNOSIS — I10 Essential (primary) hypertension: Secondary | ICD-10-CM

## 2012-04-09 DIAGNOSIS — E785 Hyperlipidemia, unspecified: Secondary | ICD-10-CM

## 2012-04-09 DIAGNOSIS — R21 Rash and other nonspecific skin eruption: Secondary | ICD-10-CM

## 2012-04-09 LAB — HEPATIC FUNCTION PANEL
AST: 23 U/L (ref 0–37)
Albumin: 4.2 g/dL (ref 3.5–5.2)
Alkaline Phosphatase: 43 U/L (ref 39–117)
Total Bilirubin: 0.7 mg/dL (ref 0.3–1.2)

## 2012-04-09 LAB — BASIC METABOLIC PANEL
Chloride: 100 mEq/L (ref 96–112)
GFR: 104.62 mL/min (ref >60.00)
Potassium: 4.2 mEq/L (ref 3.5–5.1)
Sodium: 135 mEq/L (ref 135–145)

## 2012-04-09 LAB — HEMOGLOBIN A1C: Hgb A1c MFr Bld: 6.6 % — ABNORMAL HIGH (ref 4.6–6.5)

## 2012-04-09 LAB — LIPID PANEL
Cholesterol: 122 mg/dL (ref 0–200)
HDL: 32.3 mg/dL — ABNORMAL LOW (ref >39.00)
LDL Cholesterol: 63 mg/dL (ref 0–99)
VLDL: 26.6 mg/dL (ref 0.0–40.0)

## 2012-04-09 LAB — MICROALBUMIN / CREATININE URINE RATIO: Microalb Creat Ratio: 2.7 mg/g (ref 0.0–30.0)

## 2012-04-09 MED ORDER — CLOTRIMAZOLE-BETAMETHASONE 1-0.05 % EX CREA: TOPICAL_CREAM | Freq: Two times a day (BID) | CUTANEOUS | Status: DC

## 2012-04-09 NOTE — Progress Notes (Signed)
  Subjective:    Patient ID: Eddie Mcdaniel, male    DOB: [birth year], 72 y.o.   MRN: [id number]  HPI HYPERTENSION Disease Monitoring Blood pressure range-good Chest pain- no      Dyspnea- no Medications Compliance- good Lightheadedness- no   Edema- no   DIABETES Disease Monitoring Blood Sugar ranges-115-117 Polyuria- no New Visual problems- no Medications Compliance- good Hypoglycemic symptoms- no   HYPERLIPIDEMIA Disease Monitoring See symptoms for Hypertension Medications Compliance- good RUQ pain- no  Muscle aches- no  ROS See HPI above   PMH Smoking Status noted      Review of Systems As bove    Objective:   Physical Exam  BP 150/82  Pulse 76  Temp(Src) 97.6 F (36.4 C) (Oral)  Wt 154 lb (69.854 kg)  BMI 25.63 kg/m2  SpO2 97% General appearance: alert, cooperative, appears stated age and no distress Neck: no adenopathy, supple, symmetrical, trachea midline and thyroid not enlarged, symmetric, no tenderness/mass/nodules Lungs: clear to auscultation bilaterally Heart: S1, S2 normal Extremities: extremities normal, atraumatic, no cyanosis or edema Sensory exam of the foot is normal, tested with the monofilament. Good pulses, no lesions or ulcers, good peripheral pulses.       Assessment & Plan:

## 2012-04-09 NOTE — Patient Instructions (Addendum)

## 2012-04-10 LAB — POCT URINALYSIS DIPSTICK
Bilirubin, UA: NEGATIVE
Glucose, UA: NEGATIVE
Leukocytes, UA: NEGATIVE
Nitrite, UA: NEGATIVE

## 2012-04-11 NOTE — Assessment & Plan Note (Signed)
Check labs con't meds 

## 2012-04-11 NOTE — Assessment & Plan Note (Signed)
Stable con't meds 

## 2012-04-18 ENCOUNTER — Encounter: Payer: Self-pay | Admitting: *Deleted

## 2012-04-18 ENCOUNTER — Telehealth: Payer: Self-pay | Admitting: Family Medicine

## 2012-04-18 NOTE — Telephone Encounter (Signed)
great

## 2012-04-18 NOTE — Telephone Encounter (Signed)
Notes Recorded by Lelon Perla, DO on 04/12/2012 at 2:42 PM Diabetes--- hgba1c creeping up---- Watch your diet and we will recheck it in 3 months----if its still high we can adjust medication Cholesterol is controlled--- con't meds  microalbumin is elevated---- we need to check a 24 h urine for protein Diabetes II, hyperlipidemia------hep, lipid, bmp, hgba1c

## 2012-04-18 NOTE — Telephone Encounter (Signed)
FYI-- To MD  Discussed with patient and he voiced understanding, he will be back to [place] in 3 mos and will perform the 24 hour urine at that time.He agreed to watch his diet and I made him aware that we would mail his results to him.     KP//CMA

## 2012-04-18 NOTE — Telephone Encounter (Signed)
Patient is requesting that we send his recent lab results to [place] address.

## 2012-05-15 ENCOUNTER — Telehealth: Payer: Self-pay | Admitting: Family Medicine

## 2012-05-15 DIAGNOSIS — E785 Hyperlipidemia, unspecified: Secondary | ICD-10-CM

## 2012-05-15 MED ORDER — ATORVASTATIN CALCIUM 10 MG PO TABS: 10.0000 mg | ORAL_TABLET | Freq: Every day | ORAL | Status: DC

## 2012-05-15 NOTE — Telephone Encounter (Signed)
Spoke with patient and he [facility] He stated he had to go to Urologist for an elevated PSA and he was given an antibiotic. He was taking 2 pills oral for 10 days. He was told to follow up in 6 weeks. Hew will see a Urologist in [place] int he six weeks, and Dr.Grapey request the Urologist in [place] send all results to him. He stated he tried to get the 24 hour urine test done and the urologist told him he did not need it. He will have all of his results sent to [place].    KP

## 2012-05-15 NOTE — Telephone Encounter (Signed)
Patient called with several issues. He would like [place] to send new rx for lipitor 10mg  to [facility]. He would like to know if we received notes from his urologist. He would like to discuss with someone his lab work and 24 hr urine.  CB# [phone]

## 2012-05-27 ENCOUNTER — Other Ambulatory Visit: Payer: Self-pay | Admitting: Family Medicine

## 2012-05-28 ENCOUNTER — Telehealth: Payer: Self-pay | Admitting: Family Medicine

## 2012-05-28 NOTE — Telephone Encounter (Signed)
Spoke with patient and made him aware Rx was re-sent yesterday       KP

## 2012-05-28 NOTE — Telephone Encounter (Signed)
Pt called to see if we had sent his lipitor 40mg  tablet rx to his mail order pharmacy. thanks

## 2012-07-24 ENCOUNTER — Telehealth: Payer: Self-pay | Admitting: Family Medicine

## 2012-07-24 MED ORDER — GLUCOSE BLOOD VI STRP: ORAL_STRIP | Status: AC

## 2012-07-24 NOTE — Telephone Encounter (Signed)
Rx sent 

## 2012-07-24 NOTE — Telephone Encounter (Signed)
Patient is asking for 3 months worth of contour testing strips to be sent to [facility] at [address] [place], [place] [address].

## 2012-08-07 ENCOUNTER — Telehealth: Payer: Self-pay | Admitting: Family Medicine

## 2012-08-07 ENCOUNTER — Other Ambulatory Visit: Payer: Self-pay

## 2012-08-07 MED ORDER — ONETOUCH ULTRA BLUE VI STRP: ORAL_STRIP | Status: DC

## 2012-08-07 MED ORDER — ONETOUCH DELICA LANCETS 33G MISC: 1.0000 | Freq: Every day | Status: DC

## 2012-08-07 NOTE — Telephone Encounter (Signed)
Patient uses a [facility] (medical) and needs a prescription for a 90-day supply of lancets and blood glucose scripts sent to his mail order pharmacy.

## 2012-08-13 ENCOUNTER — Other Ambulatory Visit: Payer: Self-pay | Admitting: Family Medicine

## 2012-08-25 ENCOUNTER — Other Ambulatory Visit (INDEPENDENT_AMBULATORY_CARE_PROVIDER_SITE_OTHER): Payer: Medicare Other

## 2012-08-25 ENCOUNTER — Other Ambulatory Visit: Payer: Self-pay

## 2012-08-25 ENCOUNTER — Telehealth: Payer: Self-pay

## 2012-08-25 DIAGNOSIS — E785 Hyperlipidemia, unspecified: Secondary | ICD-10-CM

## 2012-08-25 DIAGNOSIS — E119 Type 2 diabetes mellitus without complications: Secondary | ICD-10-CM

## 2012-08-25 NOTE — Telephone Encounter (Signed)
Patient presents to the office with his medication bottles for Lipitor 40mg  and Coreg 6.25. Patient states that he was suppose to get a new Rx for Liptor 10mg  and a new Rx for Coreg. Check chart and a new Rx for both have been sent in for patient. Advised if there are any further issues to please let us know.

## 2012-08-26 LAB — BASIC METABOLIC PANEL
CO2: 27 mEq/L (ref 19–32)
Calcium: 9.6 mg/dL (ref 8.4–10.5)
Chloride: 102 mEq/L (ref 96–112)
Sodium: 136 mEq/L (ref 135–145)

## 2012-08-26 LAB — LIPID PANEL
HDL: 29.4 mg/dL — ABNORMAL LOW (ref >39.00)
Triglycerides: 123 mg/dL (ref 0.0–149.0)

## 2012-08-27 ENCOUNTER — Telehealth: Payer: Self-pay

## 2012-08-27 NOTE — Telephone Encounter (Signed)
Patient calling to check on lab results. Have not been reviewed. Advised patient that once reviewed we would make him aware.

## 2012-08-29 ENCOUNTER — Telehealth: Payer: Self-pay

## 2012-08-29 NOTE — Progress Notes (Signed)
Mailed patient copy of labs. No phone number on file.

## 2012-08-29 NOTE — Telephone Encounter (Signed)
Unable to mail labs or call patient. No phone number or address.

## 2012-08-29 NOTE — Telephone Encounter (Signed)
Was able to speak with son. Will pick up at front desk.

## 2012-09-04 ENCOUNTER — Telehealth: Payer: Self-pay

## 2012-09-04 NOTE — Telephone Encounter (Signed)
Patient presents to the office with his Rx bottle and states that he had received the wrong dosage from [place]. The Rx bottle was dated with the April date with refills for the year. The bottle that he brought in was 40mg  and he takes 10mg . Advised that Rx was sent in for 10. He states that he tried talking with the pharmacy but they said that his Rx is for 40mg . Attempted to call insurance company but they need the zip code for current address, he is moving to [place] and we do not have the new address. LVM for patient to return call for his new address.

## 2012-09-04 NOTE — Telephone Encounter (Signed)
Patient calling back. New address is:   [address] [place], [place] [address]

## 2012-09-11 ENCOUNTER — Telehealth: Payer: Self-pay | Admitting: *Deleted

## 2012-09-11 DIAGNOSIS — E785 Hyperlipidemia, unspecified: Secondary | ICD-10-CM

## 2012-09-11 MED ORDER — ATORVASTATIN CALCIUM 10 MG PO TABS: 10.0000 mg | ORAL_TABLET | Freq: Every day | ORAL | Status: DC

## 2012-09-11 NOTE — Telephone Encounter (Signed)
Pharmacy called and verified. Pt advised per Soledad Gerlach. CMA.

## 2012-09-11 NOTE — Telephone Encounter (Signed)
Pharmacy is requesting a refill for Atorvastatin tabs 10 mg. I have called pharmacy and alerted them that there was a prescription e-scribed to them 05/15/12 # 90 3 R.  Agent told me that the prescription was not complete and they also need to know if the patient is taking 10mg  or 40mg  and a more clarified sig.  Ag cma

## 2012-09-11 NOTE — Telephone Encounter (Signed)
discussed with patient-- Rx sent for the 10 mg. Dr. Laury Axon had him cutting the 40 mg tab into 4's since he had so much medication. This has been confirmed with dr.lowne and OK for 10 mg.      KP

## 2013-03-16 ENCOUNTER — Telehealth: Payer: Self-pay | Admitting: Family Medicine

## 2013-03-16 MED ORDER — DILTIAZEM HCL ER COATED BEADS 180 MG PO CP24: 180.0000 mg | ORAL_CAPSULE | Freq: Every day | ORAL | Status: DC

## 2013-03-16 NOTE — Telephone Encounter (Signed)
Spoke with patient and he stated the carvedilol is going to cost him 32 dollars for a 3 mo supply and he would like something cheaper. The patient says Captopril, Cartia xt and Chlorthalidone is all on his formulary. Please advise     Kp

## 2013-03-16 NOTE — Telephone Encounter (Signed)
Spoke with patient and he is in [place] he will be back in a few weeks and will schedule an apt when he is back in town.      KP

## 2013-03-16 NOTE — Telephone Encounter (Signed)
D/c carvedilol and start cartia xt 180 mg #30  1 po qd Pt should have ov in 2-3 weeks to check bp

## 2013-03-23 ENCOUNTER — Telehealth: Payer: Self-pay | Admitting: Family Medicine

## 2013-03-23 NOTE — Telephone Encounter (Signed)
Tell him to bring his formulary with him--- I sent med in that he said his ins would pay for

## 2013-03-23 NOTE — Telephone Encounter (Signed)
Please advise      KP 

## 2013-03-23 NOTE — Telephone Encounter (Signed)
Patient called stating the medication we sent in for him is a tier 2 and he would like another medication. Pt has appt scheduled for this Wednesday, 03/25/13. He is requesting that Maudie Mercury call him back. CB#[phone] or [phone]

## 2013-03-25 ENCOUNTER — Encounter: Payer: Self-pay | Admitting: Family Medicine

## 2013-03-25 ENCOUNTER — Ambulatory Visit (INDEPENDENT_AMBULATORY_CARE_PROVIDER_SITE_OTHER): Payer: Medicare Other | Admitting: Family Medicine

## 2013-03-25 ENCOUNTER — Telehealth: Payer: Self-pay

## 2013-03-25 VITALS — BP 158/80 | HR 88 | Temp 98.5°F | Wt 153.0 lb

## 2013-03-25 DIAGNOSIS — E785 Hyperlipidemia, unspecified: Secondary | ICD-10-CM

## 2013-03-25 DIAGNOSIS — IMO0002 Reserved for concepts with insufficient information to code with codable children: Secondary | ICD-10-CM

## 2013-03-25 DIAGNOSIS — E1165 Type 2 diabetes mellitus with hyperglycemia: Secondary | ICD-10-CM

## 2013-03-25 DIAGNOSIS — I10 Essential (primary) hypertension: Secondary | ICD-10-CM

## 2013-03-25 DIAGNOSIS — Z23 Encounter for immunization: Secondary | ICD-10-CM

## 2013-03-25 DIAGNOSIS — E118 Type 2 diabetes mellitus with unspecified complications: Secondary | ICD-10-CM

## 2013-03-25 LAB — BASIC METABOLIC PANEL
BUN: 12 mg/dL (ref 6–23)
CHLORIDE: 100 meq/L (ref 96–112)
CO2: 25 meq/L (ref 19–32)
Calcium: 9.2 mg/dL (ref 8.4–10.5)
Creatinine, Ser: 0.7 mg/dL (ref 0.4–1.5)
GFR: 112.62 mL/min (ref >60.00)
Glucose, Bld: 117 mg/dL — ABNORMAL HIGH (ref 70–99)
Potassium: 3.8 mEq/L (ref 3.5–5.1)
SODIUM: 133 meq/L — AB (ref 135–145)

## 2013-03-25 LAB — POCT URINALYSIS DIPSTICK
BILIRUBIN UA: NEGATIVE
Blood, UA: NEGATIVE
GLUCOSE UA: NEGATIVE
KETONES UA: NEGATIVE
LEUKOCYTES UA: NEGATIVE
NITRITE UA: NEGATIVE
PH UA: 6
Protein, UA: NEGATIVE
Spec Grav, UA: >=1.03
Urobilinogen, UA: 0.2

## 2013-03-25 LAB — LIPID PANEL
CHOL/HDL RATIO: 4
Cholesterol: 137 mg/dL (ref 0–200)
HDL: 32.7 mg/dL — ABNORMAL LOW (ref >39.00)
LDL CALC: 74 mg/dL (ref 0–99)
TRIGLYCERIDES: 153 mg/dL — AB (ref 0.0–149.0)
VLDL: 30.6 mg/dL (ref 0.0–40.0)

## 2013-03-25 LAB — HEPATIC FUNCTION PANEL
ALT: 23 U/L (ref 0–53)
AST: 23 U/L (ref 0–37)
Albumin: 4.1 g/dL (ref 3.5–5.2)
Alkaline Phosphatase: 46 U/L (ref 39–117)
BILIRUBIN DIRECT: 0 mg/dL (ref 0.0–0.3)
BILIRUBIN TOTAL: 0.7 mg/dL (ref 0.3–1.2)
Total Protein: 7.2 g/dL (ref 6.0–8.3)

## 2013-03-25 LAB — HEMOGLOBIN A1C: Hgb A1c MFr Bld: 6.7 % — ABNORMAL HIGH (ref 4.6–6.5)

## 2013-03-25 LAB — HM DIABETES FOOT EXAM: HM Diabetic Foot Exam: NORMAL

## 2013-03-25 LAB — MICROALBUMIN / CREATININE URINE RATIO
Creatinine,U: 517.1 mg/dL
MICROALB/CREAT RATIO: 5.3 mg/g (ref 0.0–30.0)
Microalb, Ur: 27.2 mg/dL — ABNORMAL HIGH (ref 0.0–1.9)

## 2013-03-25 MED ORDER — ATORVASTATIN CALCIUM 10 MG PO TABS: 10.0000 mg | ORAL_TABLET | Freq: Every day | ORAL | Status: DC

## 2013-03-25 MED ORDER — CARVEDILOL 6.25 MG PO TABS: ORAL_TABLET | ORAL | Status: DC

## 2013-03-25 MED ORDER — METFORMIN HCL ER 750 MG PO TB24: ORAL_TABLET | ORAL | Status: DC

## 2013-03-25 NOTE — Telephone Encounter (Signed)
Patient is in the office to be seen.     KP

## 2013-03-25 NOTE — Patient Instructions (Signed)

## 2013-03-25 NOTE — Telephone Encounter (Signed)
Spoke to patient concerning his request.  Patient requesting Dilitiazem HCL to be changed to Carvedilol.  Provider aware.  [facility] was called, spoke to [place].  She shared that the medication would be changed and a fax request will be sent.  Patient was made aware.  No further questions or concerns.

## 2013-03-25 NOTE — Progress Notes (Signed)
Pre visit review using our clinic review tool, if applicable. No additional management support is needed unless otherwise documented below in the visit note. 

## 2013-03-26 ENCOUNTER — Encounter: Payer: Self-pay | Admitting: Family Medicine

## 2013-03-26 NOTE — Progress Notes (Signed)
Patient ID: Eddie Mcdaniel, male   DOB: 1940/03/20, 73 y.o.   MRN: [id number]   Subjective:    Patient ID: Eddie Mcdaniel, male    DOB: 04-26-1940, 73 y.o.   MRN: [id number] HPI  HPI HYPERTENSION  Blood pressure range-high  Chest pain- no      Dyspnea- no Lightheadedness- no   Edema- no Other side effects - no   Medication compliance: not taking anything now secondary to cose Low salt diet- yes  DIABETES  Blood Sugar ranges-good per pt  Polyuria- no New Visual problems- no Hypoglycemic symptoms- no Other side effects-no Medication compliance - good Last eye exam--- normally done in [place] exam- today  HYPERLIPIDEMIA  Medication compliance- good RUQ pain- no  Muscle aches- no Other side effects-no  ROS See HPI above   PMH Smoking Status noted             Objective:    BP 158/80  Pulse 88  Temp(Src) 98.5 F (36.9 C) (Oral)  Wt 153 lb (69.4 kg)  SpO2 96% General appearance: alert, cooperative, appears stated age and no distress Throat: lips, mucosa, and tongue normal; teeth and gums normal Neck: no adenopathy, no carotid bruit, no JVD, supple, symmetrical, trachea midline and thyroid not enlarged, symmetric, no tenderness/mass/nodules Lungs: clear to auscultation bilaterally Heart: S1, S2 normal Extremities: extremities normal, atraumatic, no cyanosis or edema Sensory exam of the foot is normal, tested with the monofilament. Good pulses, no lesions or ulcers, good peripheral pulses.        Assessment & Plan:  1. Hyperlipidemia LDL goal < 70 Check labs, con't meds - Hepatic function panel - Lipid panel - Microalbumin / creatinine urine ratio - POCT urinalysis dipstick - atorvastatin (LIPITOR) 10 MG tablet; Take 1 tablet (10 mg total) by mouth daily.  Dispense: 90 tablet; Refill: 3  2. Type II or unspecified type diabetes mellitus with unspecified complication, uncontrolled Check labs, con't meds - Basic metabolic panel - Hemoglobin  A1c - Microalbumin / creatinine urine ratio - POCT urinalysis dipstick  3. HTN (hypertension) Unable to take any tier 1 secondary to angioedema with Ace Restart coreg at pt request - Basic metabolic panel - Hemoglobin A1c - Hepatic function panel - Lipid panel - Microalbumin / creatinine urine ratio - POCT urinalysis dipstick  4. Flu vaccine need Given  - Flu Vaccine QUAD 36+ mos PF IM (Fluarix)

## 2013-03-27 ENCOUNTER — Telehealth: Payer: Self-pay | Admitting: Family Medicine

## 2013-03-27 NOTE — Telephone Encounter (Signed)
Relevant patient education mailed to patient.  

## 2013-04-01 ENCOUNTER — Ambulatory Visit (INDEPENDENT_AMBULATORY_CARE_PROVIDER_SITE_OTHER): Payer: Medicare Other | Admitting: Family Medicine

## 2013-04-01 ENCOUNTER — Telehealth: Payer: Self-pay

## 2013-04-01 ENCOUNTER — Telehealth: Payer: Self-pay | Admitting: Family Medicine

## 2013-04-01 ENCOUNTER — Encounter: Payer: Self-pay | Admitting: Family Medicine

## 2013-04-01 VITALS — BP 140/80 | HR 81 | Temp 97.1°F | Wt 154.0 lb

## 2013-04-01 DIAGNOSIS — I10 Essential (primary) hypertension: Secondary | ICD-10-CM

## 2013-04-01 DIAGNOSIS — N289 Disorder of kidney and ureter, unspecified: Secondary | ICD-10-CM

## 2013-04-01 DIAGNOSIS — E1129 Type 2 diabetes mellitus with other diabetic kidney complication: Secondary | ICD-10-CM

## 2013-04-01 DIAGNOSIS — E785 Hyperlipidemia, unspecified: Secondary | ICD-10-CM

## 2013-04-01 DIAGNOSIS — R809 Proteinuria, unspecified: Secondary | ICD-10-CM

## 2013-04-01 MED ORDER — CARVEDILOL 6.25 MG PO TABS: ORAL_TABLET | ORAL | Status: DC

## 2013-04-01 MED ORDER — FENOFIBRATE 160 MG PO TABS: 160.0000 mg | ORAL_TABLET | Freq: Every day | ORAL | Status: DC

## 2013-04-01 NOTE — Telephone Encounter (Addendum)
Carvedilol tabs 6.25mg  #180 with 1 refill; direction: take 1 tablet twice a day with a meal. -Per Dr. Etter Sjogren Above prescription called into [facility]. Spoke with Maximino Greenland, Pharmacy Tech.   Medication will be delivered to patient's home in [place], [place].

## 2013-04-01 NOTE — Telephone Encounter (Signed)
Relevant patient education mailed to patient.  

## 2013-04-01 NOTE — Progress Notes (Signed)
Pre-visit discussion using our clinic review tool. No additional management support is needed unless otherwise documented below in the visit note.  

## 2013-04-01 NOTE — Patient Instructions (Signed)

## 2013-04-01 NOTE — Progress Notes (Signed)
Pt in office today, did labs and was given collection jug for 24 hour urine

## 2013-04-01 NOTE — Progress Notes (Signed)
  Subjective:    Patient here for follow-up of elevated blood pressure.  He is not exercising and is adherent to a low-salt diet.  Blood pressure is not well controlled at home. Cardiac symptoms: none. Patient denies: chest pain, chest pressure/discomfort, claudication, dyspnea, exertional chest pressure/discomfort, fatigue, irregular heart beat, lower extremity edema, near-syncope, orthopnea, palpitations, paroxysmal nocturnal dyspnea, syncope and tachypnea. Cardiovascular risk factors: advanced age (older than 64 for men, 24 for women), diabetes mellitus, dyslipidemia, hypertension, male gender and sedentary lifestyle. Use of agents associated with hypertension: none. History of target organ damage: none.  The following portions of the patient's history were reviewed and updated as appropriate: allergies, current medications, past family history, past medical history, past social history, past surgical history and problem list.  Review of Systems Pertinent items are noted in HPI.     Objective:    BP 140/80  Pulse 81  Temp(Src) 97.1 F (36.2 C) (Oral)  Wt 154 lb (69.854 kg)  SpO2 97% General appearance: alert, cooperative, appears stated age and no distress Lungs: clear to auscultation bilaterally Heart: S1, S2 normal Extremities: extremities normal, atraumatic, no cyanosis or edema    Assessment:    Hypertension, stage 1 . Evidence of target organ damage: none.    Plan:    Medication: discontinue diltiazam and resume coreg. Dietary sodium restriction. Regular aerobic exercise. Check blood pressures 2-3 times weekly and record. Follow up: 3 months and as needed.

## 2013-04-04 LAB — PROTEIN, URINE, 24 HOUR
Protein, 24H Urine: 60 mg/d (ref 50–100)
Protein, Urine: 3 mg/dL

## 2013-04-06 ENCOUNTER — Telehealth: Payer: Self-pay

## 2013-04-06 NOTE — Telephone Encounter (Signed)
Walk-In form left on my desk advising the patient requested 5 min's to speak with me. He declined to speak with anyone else. Spoke with patient and he stated he had an elevated PSA and is going to see his urologist today and wanted to know if he could get a copy of his 24 hour to take with him. He also wanted to know if he should see the Nephrologist. Reviewed 24 hour and advised it was normal. 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 The patient has agreed to do so.      KP

## 2013-06-16 ENCOUNTER — Telehealth: Payer: Self-pay | Admitting: Family Medicine

## 2013-06-16 NOTE — Telephone Encounter (Signed)
Patient called to return your phone call. Please advise.  °

## 2013-06-16 NOTE — Telephone Encounter (Signed)
MSG left to call the office      KP 

## 2013-06-16 NOTE — Telephone Encounter (Signed)
Caller name:Jay Fausto Relation to pt:pt Call back number: [phone] Pharmacy:  Reason for call: patient called to speak with Maudie Mercury and did not give any further details. Please advise.

## 2013-06-17 NOTE — Telephone Encounter (Signed)
Spoke w/pt. He would like to have a time frame for his kidney appt due to him splitting his time between [place] and [place]. Attempted to call [facility], but could not reach anyone. Will try again tomorrow.

## 2013-06-17 NOTE — Telephone Encounter (Signed)
Pt is returning call.  [phone].

## 2013-06-17 NOTE — Telephone Encounter (Signed)
Spoke with patient and he was inquiring about his Nephrology referral. I will forward call to [place].     KP

## 2013-06-18 NOTE — Telephone Encounter (Signed)
Spoke w/Rhonda @ [facility]. She states it will be about 3 months from now before patient is scheduled. Made pt aware and he understood.

## 2013-07-02 ENCOUNTER — Other Ambulatory Visit: Payer: Self-pay | Admitting: Family Medicine

## 2013-09-08 ENCOUNTER — Telehealth: Payer: Self-pay | Admitting: Family Medicine

## 2013-09-08 ENCOUNTER — Encounter: Payer: Self-pay | Admitting: Family Medicine

## 2013-09-08 ENCOUNTER — Ambulatory Visit (INDEPENDENT_AMBULATORY_CARE_PROVIDER_SITE_OTHER): Payer: Medicare Other | Admitting: Family Medicine

## 2013-09-08 VITALS — BP 136/82 | HR 57 | Temp 97.7°F | Ht 64.5 in | Wt 154.0 lb

## 2013-09-08 DIAGNOSIS — N400 Enlarged prostate without lower urinary tract symptoms: Secondary | ICD-10-CM

## 2013-09-08 DIAGNOSIS — R519 Headache, unspecified: Secondary | ICD-10-CM

## 2013-09-08 DIAGNOSIS — E785 Hyperlipidemia, unspecified: Secondary | ICD-10-CM

## 2013-09-08 DIAGNOSIS — I10 Essential (primary) hypertension: Secondary | ICD-10-CM

## 2013-09-08 DIAGNOSIS — E1149 Type 2 diabetes mellitus with other diabetic neurological complication: Secondary | ICD-10-CM

## 2013-09-08 DIAGNOSIS — Z23 Encounter for immunization: Secondary | ICD-10-CM

## 2013-09-08 DIAGNOSIS — R51 Headache: Secondary | ICD-10-CM

## 2013-09-08 LAB — BASIC METABOLIC PANEL
BUN: 10 mg/dL (ref 6–23)
CO2: 25 meq/L (ref 19–32)
Calcium: 9.3 mg/dL (ref 8.4–10.5)
Chloride: 99 mEq/L (ref 96–112)
Creatinine, Ser: 0.7 mg/dL (ref 0.4–1.5)
GFR: 122.07 mL/min (ref >60.00)
GLUCOSE: 121 mg/dL — AB (ref 70–99)
POTASSIUM: 4.4 meq/L (ref 3.5–5.1)
SODIUM: 135 meq/L (ref 135–145)

## 2013-09-08 LAB — CBC WITH DIFFERENTIAL/PLATELET
BASOS ABS: 0 10*3/uL (ref 0.0–0.1)
Basophils Relative: 0.4 % (ref 0.0–3.0)
EOS ABS: 0.2 10*3/uL (ref 0.0–0.7)
Eosinophils Relative: 3.1 % (ref 0.0–5.0)
HEMATOCRIT: 40.3 % (ref 39.0–52.0)
HEMOGLOBIN: 13.3 g/dL (ref 13.0–17.0)
LYMPHS ABS: 1.8 10*3/uL (ref 0.7–4.0)
Lymphocytes Relative: 23.3 % (ref 12.0–46.0)
MCHC: 33 g/dL (ref 30.0–36.0)
MCV: 89 fl (ref 78.0–100.0)
MONO ABS: 0.5 10*3/uL (ref 0.1–1.0)
Monocytes Relative: 6.6 % (ref 3.0–12.0)
NEUTROS ABS: 5.2 10*3/uL (ref 1.4–7.7)
Neutrophils Relative %: 66.6 % (ref 43.0–77.0)
PLATELETS: 164 10*3/uL (ref 150.0–400.0)
RBC: 4.53 Mil/uL (ref 4.22–5.81)
RDW: 12.9 % (ref 11.5–15.5)
WBC: 7.8 10*3/uL (ref 4.0–10.5)

## 2013-09-08 LAB — POCT URINALYSIS DIPSTICK
BILIRUBIN UA: NEGATIVE
Blood, UA: NEGATIVE
Glucose, UA: NEGATIVE
KETONES UA: NEGATIVE
LEUKOCYTES UA: NEGATIVE
Nitrite, UA: NEGATIVE
PROTEIN UA: NEGATIVE
Spec Grav, UA: <=1.005
Urobilinogen, UA: 0.2
pH, UA: 6.5

## 2013-09-08 LAB — LIPID PANEL
Cholesterol: 119 mg/dL (ref 0–200)
HDL: 31.7 mg/dL — AB (ref >39.00)
LDL Cholesterol: 63 mg/dL (ref 0–99)
NonHDL: 87.3
TRIGLYCERIDES: 123 mg/dL (ref 0.0–149.0)
Total CHOL/HDL Ratio: 4
VLDL: 24.6 mg/dL (ref 0.0–40.0)

## 2013-09-08 LAB — MICROALBUMIN / CREATININE URINE RATIO
Creatinine,U: 58.8 mg/dL
Microalb Creat Ratio: 3.4 mg/g (ref 0.0–30.0)
Microalb, Ur: 2 mg/dL — ABNORMAL HIGH (ref 0.0–1.9)

## 2013-09-08 LAB — HEPATIC FUNCTION PANEL
ALT: 27 U/L (ref 0–53)
AST: 27 U/L (ref 0–37)
Albumin: 4.2 g/dL (ref 3.5–5.2)
Alkaline Phosphatase: 41 U/L (ref 39–117)
Bilirubin, Direct: 0.1 mg/dL (ref 0.0–0.3)
Total Bilirubin: 0.8 mg/dL (ref 0.2–1.2)
Total Protein: 7.2 g/dL (ref 6.0–8.3)

## 2013-09-08 LAB — HEMOGLOBIN A1C: HEMOGLOBIN A1C: 7 % — AB (ref 4.6–6.5)

## 2013-09-08 LAB — PSA: PSA: 8.17 ng/mL — ABNORMAL HIGH (ref 0.10–4.00)

## 2013-09-08 LAB — SEDIMENTATION RATE: Sed Rate: 7 mm/hr (ref 0–22)

## 2013-09-08 NOTE — Patient Instructions (Signed)
Preventive Care for Adults A healthy lifestyle and preventive care can promote health and wellness. Preventive health guidelines for men include the following key practices:  A routine yearly physical is a good way to check with your health care provider about your health and preventative screening. It is a chance to share any concerns and updates on your health and to receive a thorough exam.  Visit your dentist for a routine exam and preventative care every 6 months. Brush your teeth twice a day and floss once a day. Good oral hygiene prevents tooth decay and gum disease.  The frequency of eye exams is based on your age, health, family medical history, use of contact lenses, and other factors. Follow your health care provider's recommendations for frequency of eye exams.  Eat a healthy diet. Foods such as vegetables, fruits, whole grains, low-fat dairy products, and lean protein foods contain the nutrients you need without too many calories. Decrease your intake of foods high in solid fats, added sugars, and salt. Eat the right amount of calories for you.Get information about a proper diet from your health care provider, if necessary.  Regular physical exercise is one of the most important things you can do for your health. Most adults should get at least 150 minutes of moderate-intensity exercise (any activity that increases your heart rate and causes you to sweat) each week. In addition, most adults need muscle-strengthening exercises on 2 or more days a week.  Maintain a healthy weight. The body mass index (BMI) is a screening tool to identify possible weight problems. It provides an estimate of body fat based on height and weight. Your health care provider can find your BMI and can help you achieve or maintain a healthy weight.For adults 20 years and older:  A BMI below 18.5 is considered underweight.  A BMI of 18.5 to 24.9 is normal.  A BMI of 25 to 29.9 is considered overweight.  A BMI  of 30 and above is considered obese.  Maintain normal blood lipids and cholesterol levels by exercising and minimizing your intake of saturated fat. Eat a balanced diet with plenty of fruit and vegetables. Blood tests for lipids and cholesterol should begin at age 50 and be repeated every 5 years. If your lipid or cholesterol levels are high, you are over 50, or you are at high risk for heart disease, you may need your cholesterol levels checked more frequently.Ongoing high lipid and cholesterol levels should be treated with medicines if diet and exercise are not working.  If you smoke, find out from your health care provider how to quit. If you do not use tobacco, do not start.  Lung cancer screening is recommended for adults aged 73-80 years who are at high risk for developing lung cancer because of a history of smoking. A yearly low-dose CT scan of the lungs is recommended for people who have at least a 30-pack-year history of smoking and are a current smoker or have quit within the past 15 years. A pack year of smoking is smoking an average of 1 pack of cigarettes a day for 1 year (for example: 1 pack a day for 30 years or 2 packs a day for 15 years). Yearly screening should continue until the smoker has stopped smoking for at least 15 years. Yearly screening should be stopped for people who develop a health problem that would prevent them from having lung cancer treatment.  If you choose to drink alcohol, do not have more than  2 drinks per day. One drink is considered to be 12 ounces (355 mL) of beer, 5 ounces (148 mL) of wine, or 1.5 ounces (44 mL) of liquor.  Avoid use of street drugs. Do not share needles with anyone. Ask for help if you need support or instructions about stopping the use of drugs.  High blood pressure causes heart disease and increases the risk of stroke. Your blood pressure should be checked at least every 1-2 years. Ongoing high blood pressure should be treated with  medicines, if weight loss and exercise are not effective.  If you are 45-79 years old, ask your health care provider if you should take aspirin to prevent heart disease.  Diabetes screening involves taking a blood sample to check your fasting blood sugar level. This should be done once every 3 years, after age 45, if you are within normal weight and without risk factors for diabetes. Testing should be considered at a younger age or be carried out more frequently if you are overweight and have at least 1 risk factor for diabetes.  Colorectal cancer can be detected and often prevented. Most routine colorectal cancer screening begins at the age of 50 and continues through age 75. However, your health care provider may recommend screening at an earlier age if you have risk factors for colon cancer. On a yearly basis, your health care provider may provide home test kits to check for hidden blood in the stool. Use of a small camera at the end of a tube to directly examine the colon (sigmoidoscopy or colonoscopy) can detect the earliest forms of colorectal cancer. Talk to your health care provider about this at age 50, when routine screening begins. Direct exam of the colon should be repeated every 5-10 years through age 75, unless early forms of precancerous polyps or small growths are found.  People who are at an increased risk for hepatitis B should be screened for this virus. You are considered at high risk for hepatitis B if:  You were born in a country where hepatitis B occurs often. Talk with your health care provider about which countries are considered high risk.  Your parents were born in a high-risk country and you have not received a shot to protect against hepatitis B (hepatitis B vaccine).  You have HIV or AIDS.  You use needles to inject street drugs.  You live with, or have sex with, someone who has hepatitis B.  You are a man who has sex with other men (MSM).  You get hemodialysis  treatment.  You take certain medicines for conditions such as cancer, organ transplantation, and autoimmune conditions.  Hepatitis C blood testing is recommended for all people born from 1945 through 1965 and any individual with known risks for hepatitis C.  Practice safe sex. Use condoms and avoid high-risk sexual practices to reduce the spread of sexually transmitted infections (STIs). STIs include gonorrhea, chlamydia, syphilis, trichomonas, herpes, HPV, and human immunodeficiency virus (HIV). Herpes, HIV, and HPV are viral illnesses that have no cure. They can result in disability, cancer, and death.  If you are at risk of being infected with HIV, it is recommended that you take a prescription medicine daily to prevent HIV infection. This is called preexposure prophylaxis (PrEP). You are considered at risk if:  You are a man who has sex with other men (MSM) and have other risk factors.  You are a heterosexual man, are sexually active, and are at increased risk for HIV infection.    You take drugs by injection.  You are sexually active with a partner who has HIV.  Talk with your health care provider about whether you are at high risk of being infected with HIV. If you choose to begin PrEP, you should first be tested for HIV. You should then be tested every 3 months for as long as you are taking PrEP.  A one-time screening for abdominal aortic aneurysm (AAA) and surgical repair of large AAAs by ultrasound are recommended for men ages 32 to 67 years who are current or former smokers.  Healthy men should no longer receive prostate-specific antigen (PSA) blood tests as part of routine cancer screening. Talk with your health care provider about prostate cancer screening.  Testicular cancer screening is not recommended for adult males who have no symptoms. Screening includes self-exam, a health care provider exam, and other screening tests. Consult with your health care provider about any symptoms  you have or any concerns you have about testicular cancer.  Use sunscreen. Apply sunscreen liberally and repeatedly throughout the day. You should seek shade when your shadow is shorter than you. Protect yourself by wearing long sleeves, pants, a wide-brimmed hat, and sunglasses year round, whenever you are outdoors.  Once a month, do a whole-body skin exam, using a mirror to look at the skin on your back. Tell your health care provider about new moles, moles that have irregular borders, moles that are larger than a pencil eraser, or moles that have changed in shape or color.  Stay current with required vaccines (immunizations).  Influenza vaccine. All adults should be immunized every year.  Tetanus, diphtheria, and acellular pertussis (Td, Tdap) vaccine. An adult who has not previously received Tdap or who does not know his vaccine status should receive 1 dose of Tdap. This initial dose should be followed by tetanus and diphtheria toxoids (Td) booster doses every 10 years. Adults with an unknown or incomplete history of completing a 3-dose immunization series with Td-containing vaccines should begin or complete a primary immunization series including a Tdap dose. Adults should receive a Td booster every 10 years.  Varicella vaccine. An adult without evidence of immunity to varicella should receive 2 doses or a second dose if he has previously received 1 dose.  Human papillomavirus (HPV) vaccine. Males aged 68-21 years who have not received the vaccine previously should receive the 3-dose series. Males aged 22-26 years may be immunized. Immunization is recommended through the age of 6 years for any male who has sex with males and did not get any or all doses earlier. Immunization is recommended for any person with an immunocompromised condition through the age of 49 years if he did not get any or all doses earlier. During the 3-dose series, the second dose should be obtained 4-8 weeks after the first  dose. The third dose should be obtained 24 weeks after the first dose and 16 weeks after the second dose.  Zoster vaccine. One dose is recommended for adults aged 50 years or older unless certain conditions are present.  Measles, mumps, and rubella (MMR) vaccine. Adults born before [birth year] generally are considered immune to measles and mumps. Adults born in [birth year] or later should have 1 or more doses of MMR vaccine unless there is a contraindication to the vaccine or there is laboratory evidence of immunity to each of the three diseases. A routine second dose of MMR vaccine should be obtained at least 28 days after the first dose for students attending postsecondary  schools, health care workers, or international travelers. People who received inactivated measles vaccine or an unknown type of measles vaccine during 1963-1967 should receive 2 doses of MMR vaccine. People who received inactivated mumps vaccine or an unknown type of mumps vaccine before 1979 and are at high risk for mumps infection should consider immunization with 2 doses of MMR vaccine. Unvaccinated health care workers born before 1957 who lack laboratory evidence of measles, mumps, or rubella immunity or laboratory confirmation of disease should consider measles and mumps immunization with 2 doses of MMR vaccine or rubella immunization with 1 dose of MMR vaccine.  Pneumococcal 13-valent conjugate (PCV13) vaccine. When indicated, a person who is uncertain of his immunization history and has no record of immunization should receive the PCV13 vaccine. An adult aged 19 years or older who has certain medical conditions and has not been previously immunized should receive 1 dose of PCV13 vaccine. This PCV13 should be followed with a dose of pneumococcal polysaccharide (PPSV23) vaccine. The PPSV23 vaccine dose should be obtained at least 8 weeks after the dose of PCV13 vaccine. An adult aged 19 years or older who has certain medical conditions and  previously received 1 or more doses of PPSV23 vaccine should receive 1 dose of PCV13. The PCV13 vaccine dose should be obtained 1 or more years after the last PPSV23 vaccine dose.  Pneumococcal polysaccharide (PPSV23) vaccine. When PCV13 is also indicated, PCV13 should be obtained first. All adults aged 65 years and older should be immunized. An adult younger than age 65 years who has certain medical conditions should be immunized. Any person who resides in a nursing home or long-term care facility should be immunized. An adult smoker should be immunized. People with an immunocompromised condition and certain other conditions should receive both PCV13 and PPSV23 vaccines. People with human immunodeficiency virus (HIV) infection should be immunized as soon as possible after diagnosis. Immunization during chemotherapy or radiation therapy should be avoided. Routine use of PPSV23 vaccine is not recommended for American Indians, [place] Natives, or people younger than 65 years unless there are medical conditions that require PPSV23 vaccine. When indicated, people who have unknown immunization and have no record of immunization should receive PPSV23 vaccine. One-time revaccination 5 years after the first dose of PPSV23 is recommended for people aged 19-64 years who have chronic kidney failure, nephrotic syndrome, asplenia, or immunocompromised conditions. People who received 1-2 doses of PPSV23 before age 65 years should receive another dose of PPSV23 vaccine at age 65 years or later if at least 5 years have passed since the previous dose. Doses of PPSV23 are not needed for people immunized with PPSV23 at or after age 65 years.  Meningococcal vaccine. Adults with asplenia or persistent complement component deficiencies should receive 2 doses of quadrivalent meningococcal conjugate (MenACWY-D) vaccine. The doses should be obtained at least 2 months apart. Microbiologists working with certain meningococcal bacteria,  military recruits, people at risk during an outbreak, and people who travel to or live in countries with a high rate of meningitis should be immunized. A first-year college student up through age 21 years who is living in a residence hall should receive a dose if he did not receive a dose on or after his 16th birthday. Adults who have certain high-risk conditions should receive one or more doses of vaccine.  Hepatitis A vaccine. Adults who wish to be protected from this disease, have certain high-risk conditions, work with hepatitis A-infected animals, work in hepatitis A research labs, or   travel to or work in countries with a high rate of hepatitis A should be immunized. Adults who were previously unvaccinated and who anticipate close contact with an international adoptee during the first 60 days after arrival in the [place] from a country with a high rate of hepatitis A should be immunized.  Hepatitis B vaccine. Adults should be immunized if they wish to be protected from this disease, have certain high-risk conditions, may be exposed to blood or other infectious body fluids, are household contacts or sex partners of hepatitis B positive people, are clients or workers in certain care facilities, or travel to or work in countries with a high rate of hepatitis B.  Haemophilus influenzae type b (Hib) vaccine. A previously unvaccinated person with asplenia or sickle cell disease or having a scheduled splenectomy should receive 1 dose of Hib vaccine. Regardless of previous immunization, a recipient of a hematopoietic stem cell transplant should receive a 3-dose series 6-12 months after his successful transplant. Hib vaccine is not recommended for adults with HIV infection. Preventive Service / Frequency Ages 52 to 17  Blood pressure check.** / Every 1 to 2 years.  Lipid and cholesterol check.** / Every 5 years beginning at age 69.  Hepatitis C blood test.** / For any individual with known risks for  hepatitis C.  Skin self-exam. / Monthly.  Influenza vaccine. / Every year.  Tetanus, diphtheria, and acellular pertussis (Tdap, Td) vaccine.** / Consult your health care provider. 1 dose of Td every 10 years.  Varicella vaccine.** / Consult your health care provider.  HPV vaccine. / 3 doses over 6 months, if 72 or younger.  Measles, mumps, rubella (MMR) vaccine.** / You need at least 1 dose of MMR if you were born in 1957 or later. You may also need a second dose.  Pneumococcal 13-valent conjugate (PCV13) vaccine.** / Consult your health care provider.  Pneumococcal polysaccharide (PPSV23) vaccine.** / 1 to 2 doses if you smoke cigarettes or if you have certain conditions.  Meningococcal vaccine.** / 1 dose if you are age 35 to 60 years and a Market researcher living in a residence hall, or have one of several medical conditions. You may also need additional booster doses.  Hepatitis A vaccine.** / Consult your health care provider.  Hepatitis B vaccine.** / Consult your health care provider.  Haemophilus influenzae type b (Hib) vaccine.** / Consult your health care provider. Ages 35 to 8  Blood pressure check.** / Every 1 to 2 years.  Lipid and cholesterol check.** / Every 5 years beginning at age 57.  Lung cancer screening. / Every year if you are aged 44-80 years and have a 30-pack-year history of smoking and currently smoke or have quit within the past 15 years. Yearly screening is stopped once you have quit smoking for at least 15 years or develop a health problem that would prevent you from having lung cancer treatment.  Fecal occult blood test (FOBT) of stool. / Every year beginning at age 55 and continuing until age 73. You may not have to do this test if you get a colonoscopy every 10 years.  Flexible sigmoidoscopy** or colonoscopy.** / Every 5 years for a flexible sigmoidoscopy or every 10 years for a colonoscopy beginning at age 28 and continuing until age  1.  Hepatitis C blood test.** / For all people born from [birth year] through 1965 and any individual with known risks for hepatitis C.  Skin self-exam. / Monthly.  Influenza vaccine. / Every  year.  Tetanus, diphtheria, and acellular pertussis (Tdap/Td) vaccine.** / Consult your health care provider. 1 dose of Td every 10 years.  Varicella vaccine.** / Consult your health care provider.  Zoster vaccine.** / 1 dose for adults aged 53 years or older.  Measles, mumps, rubella (MMR) vaccine.** / You need at least 1 dose of MMR if you were born in 1957 or later. You may also need a second dose.  Pneumococcal 13-valent conjugate (PCV13) vaccine.** / Consult your health care provider.  Pneumococcal polysaccharide (PPSV23) vaccine.** / 1 to 2 doses if you smoke cigarettes or if you have certain conditions.  Meningococcal vaccine.** / Consult your health care provider.  Hepatitis A vaccine.** / Consult your health care provider.  Hepatitis B vaccine.** / Consult your health care provider.  Haemophilus influenzae type b (Hib) vaccine.** / Consult your health care provider. Ages 77 and over  Blood pressure check.** / Every 1 to 2 years.  Lipid and cholesterol check.**/ Every 5 years beginning at age 85.  Lung cancer screening. / Every year if you are aged 55-80 years and have a 30-pack-year history of smoking and currently smoke or have quit within the past 15 years. Yearly screening is stopped once you have quit smoking for at least 15 years or develop a health problem that would prevent you from having lung cancer treatment.  Fecal occult blood test (FOBT) of stool. / Every year beginning at age 33 and continuing until age 11. You may not have to do this test if you get a colonoscopy every 10 years.  Flexible sigmoidoscopy** or colonoscopy.** / Every 5 years for a flexible sigmoidoscopy or every 10 years for a colonoscopy beginning at age 28 and continuing until age 73.  Hepatitis C blood  test.** / For all people born from [birth year] through 1965 and any individual with known risks for hepatitis C.  Abdominal aortic aneurysm (AAA) screening.** / A one-time screening for ages 50 to 27 years who are current or former smokers.  Skin self-exam. / Monthly.  Influenza vaccine. / Every year.  Tetanus, diphtheria, and acellular pertussis (Tdap/Td) vaccine.** / 1 dose of Td every 10 years.  Varicella vaccine.** / Consult your health care provider.  Zoster vaccine.** / 1 dose for adults aged 34 years or older.  Pneumococcal 13-valent conjugate (PCV13) vaccine.** / Consult your health care provider.  Pneumococcal polysaccharide (PPSV23) vaccine.** / 1 dose for all adults aged 63 years and older.  Meningococcal vaccine.** / Consult your health care provider.  Hepatitis A vaccine.** / Consult your health care provider.  Hepatitis B vaccine.** / Consult your health care provider.  Haemophilus influenzae type b (Hib) vaccine.** / Consult your health care provider. **Family history and personal history of risk and conditions may change your health care provider's recommendations. Document Released: 03/13/2001 Document Revised: 01/20/2013 Document Reviewed: 06/12/2010 [facility] Patient Information 2015 [place], [place]. This information is not intended to replace advice given to you by your health care provider. Make sure you discuss any questions you have with your health care provider.

## 2013-09-08 NOTE — Telephone Encounter (Signed)
Patient said he spoke with the referral coordinator and has his appointment info.     KP

## 2013-09-08 NOTE — Progress Notes (Signed)
Subjective:    Eddie Mcdaniel is a 73 y.o. male who presents for Medicare Annual/Subsequent preventive examination.   Preventive Screening-Counseling & Management  Tobacco History  Smoking status  . Never Smoker   Smokeless tobacco  . Former Systems developer  . Types: Chew  . Quit date: 04/30/2010    Problems Prior to Visit 1. Headaches after 7-8 hours of sleep --every day  Current Problems (verified) Patient Active Problem List   Diagnosis Date Noted  . Hip pain, left 07/18/2010  . CONTACT DERMATITIS 07/25/2009  . PNEUMONIA, BILATERAL 04/13/2009  . Nonspecific (abnormal) findings on radiological and other examination of body structure 04/13/2009  . COMPUTERIZED TOMOGRAPHY, CHEST, ABNORMAL 04/13/2009  . ELEVATED PROSTATE SPECIFIC ANTIGEN 03/18/2009  . ACUTE SINUSITIS, UNSPECIFIED 01/27/2009  . ACUTE PHARYNGITIS 01/27/2009  . COUGH 01/27/2009  . URI 06/03/2007  . DIABETES MELLITUS, TYPE II 04/15/2006  . HYPERLIPIDEMIA 04/15/2006  . HYPERTENSION 04/15/2006  . GERD 04/15/2006    Medications Prior to Visit Current Outpatient Prescriptions on File Prior to Visit  Medication Sig Dispense Refill  . aspirin 81 MG chewable tablet Chew 81 mg by mouth daily.        Marland Kitchen atorvastatin (LIPITOR) 10 MG tablet Take 1 tablet (10 mg total) by mouth daily.  90 tablet  3  . carvedilol (COREG) 6.25 MG tablet TAKE 1 TABLET TWICE A DAY WITH A MEAL  60 tablet  2  . clotrimazole-betamethasone (LOTRISONE) cream Apply topically 2 (two) times daily.  30 g  0  . metFORMIN (GLUCOPHAGE-XR) 750 MG 24 hr tablet 1 tablet by mouth twice daily--labs are due now  180 tablet  0  . multivitamin-iron-minerals-folic acid (CENTRUM) chewable tablet Chew 1 tablet by mouth 2 (two) times daily.        . ONE TOUCH ULTRA TEST test strip Check blood sugar once a day. Dx 250.00  300 each  4  . ONETOUCH DELICA LANCETS 27O MISC 1 Device by Does not apply route daily. DX 250.00  300 each  4  . fenofibrate 160 MG tablet Take 1  tablet (160 mg total) by mouth daily.  90 tablet  3   No current facility-administered medications on file prior to visit.    Current Medications (verified) Current Outpatient Prescriptions  Medication Sig Dispense Refill  . aspirin 81 MG chewable tablet Chew 81 mg by mouth daily.        Marland Kitchen atorvastatin (LIPITOR) 10 MG tablet Take 1 tablet (10 mg total) by mouth daily.  90 tablet  3  . carvedilol (COREG) 6.25 MG tablet TAKE 1 TABLET TWICE A DAY WITH A MEAL  60 tablet  2  . clotrimazole-betamethasone (LOTRISONE) cream Apply topically 2 (two) times daily.  30 g  0  . Flaxseed, Linseed, (FLAX SEED OIL) 1000 MG CAPS Take 1 capsule by mouth daily.      . metFORMIN (GLUCOPHAGE-XR) 750 MG 24 hr tablet 1 tablet by mouth twice daily--labs are due now  180 tablet  0  . multivitamin-iron-minerals-folic acid (CENTRUM) chewable tablet Chew 1 tablet by mouth 2 (two) times daily.        . ONE TOUCH ULTRA TEST test strip Check blood sugar once a day. Dx 250.00  300 each  4  . ONETOUCH DELICA LANCETS 35K MISC 1 Device by Does not apply route daily. DX 250.00  300 each  4  . fenofibrate 160 MG tablet Take 1 tablet (160 mg total) by mouth daily.  90 tablet  3  No current facility-administered medications for this visit.     Allergies (verified) Ace inhibitors   PAST HISTORY  Family History Family History  Problem Relation Age of Onset  . Diabetes Mother   . Hypertension Mother   . Cancer Brother 63    brain, stomach  . Hyperlipidemia Brother   . Heart disease Brother     MI  . Heart disease Brother     MI  . Diabetes Brother   . Stroke Brother   . Hypertension Brother     Social History History  Substance Use Topics  . Smoking status: Never Smoker   . Smokeless tobacco: Former Systems developer    Types: Chew    Quit date: 04/30/2010  . Alcohol Use: No    Are there smokers in your home (other than you)?  No  Risk Factors Current exercise habits: walking  Dietary issues discussed: na    Cardiac risk factors: advanced age (older than 79 for men, 38 for women), diabetes mellitus, dyslipidemia, hypertension and male gender.  Depression Screen (Note: if answer to either of the following is "Yes", a more complete depression screening is indicated)   Q1: Over the past two weeks, have you felt down, depressed or hopeless? No  Q2: Over the past two weeks, have you felt little interest or pleasure in doing things? No  Have you lost interest or pleasure in daily life? No  Do you often feel hopeless? No  Do you cry easily over simple problems? No  Activities of Daily Living In your present state of health, do you have any difficulty performing the following activities?:  Driving? No Managing money?  No Feeding yourself? No Getting from bed to chair? No Climbing a flight of stairs? No Preparing food and eating?: No Bathing or showering? No Getting dressed: No Getting to the toilet? No Using the toilet:No Moving around from place to place: No In the past year have you fallen or had a near fall?:No   Are you sexually active?  Yes  Do you have more than one partner?  No  Hearing Difficulties: yes Do you often ask people to speak up or repeat themselves? yes Do you experience ringing or noises in your ears? No Do you have difficulty understanding soft or whispered voices? yes   Do you feel that you have a problem with memory? No  Do you often misplace items? No  Do you feel safe at home?  No  Cognitive Testing  Alert? Yes  Normal Appearance?Yes  Oriented to person? Yes  Place? Yes   Time? Yes  Recall of three objects?  Yes  Can perform simple calculations? Yes  Displays appropriate judgment?Yes  Can read the correct time from a watch face?Yes   Advanced Directives have been discussed with the patient? Yes   List the Names of Other Physician/Practitioners you currently use: 1.  opth-- [place] 2.   Dentist-- [place]   Indicate any recent Medical Services you may  have received from other than [facility] providers in the past year (date may be approximate).  Immunization History  Administered Date(s) Administered  . Influenza Split 10/30/2010, 12/21/2011  . Influenza Whole 11/18/2009  . Influenza,inj,Quad PF,36+ Mos 03/25/2013  . Pneumococcal Polysaccharide-23 10/24/2006    Screening Tests Health Maintenance  Topic Date Due  . Tetanus/tdap  05/14/1961  . Pneumococcal Polysaccharide Vaccine Age 21 And Over  05/15/2007  . Colonoscopy  07/27/2009  . Influenza Vaccine  11/08/2013 (Originally 08/29/2013)  . Hemoglobin A1c  09/22/2013  . Foot Exam  03/25/2014  . Urine Microalbumin  03/25/2014  . Ophthalmology Exam  08/05/2014  . Zostavax  Addressed    All answers were reviewed with the patient and necessary referrals were made:  Garnet Koyanagi, DO   09/08/2013   History reviewed:  He  has a past medical history of GERD (gastroesophageal reflux disease); Hyperlipidemia; Hypertension; and Diabetes mellitus. He  does not have any pertinent problems on file. He  has past surgical history that includes Shoulder arthroscopy w/ rotator cuff repair and Eye surgery (2005). His family history includes Cancer (age of onset: 28) in his brother; Diabetes in his brother and mother; Heart disease in his brother and brother; Hyperlipidemia in his brother; Hypertension in his brother and mother; Stroke in his brother. He  reports that he has never smoked. He quit smokeless tobacco use about 3 years ago. His smokeless tobacco use included Chew. He reports that he does not drink alcohol or use illicit drugs. He has a current medication list which includes the following prescription(s): aspirin, atorvastatin, carvedilol, clotrimazole-betamethasone, flax seed oil, metformin, multivitamin-iron-minerals-folic acid, one touch ultra test, onetouch delica lancets 51O, and fenofibrate. Current Outpatient Prescriptions on File Prior to Visit  Medication Sig Dispense Refill  . aspirin  81 MG chewable tablet Chew 81 mg by mouth daily.        Marland Kitchen atorvastatin (LIPITOR) 10 MG tablet Take 1 tablet (10 mg total) by mouth daily.  90 tablet  3  . carvedilol (COREG) 6.25 MG tablet TAKE 1 TABLET TWICE A DAY WITH A MEAL  60 tablet  2  . clotrimazole-betamethasone (LOTRISONE) cream Apply topically 2 (two) times daily.  30 g  0  . metFORMIN (GLUCOPHAGE-XR) 750 MG 24 hr tablet 1 tablet by mouth twice daily--labs are due now  180 tablet  0  . multivitamin-iron-minerals-folic acid (CENTRUM) chewable tablet Chew 1 tablet by mouth 2 (two) times daily.        . ONE TOUCH ULTRA TEST test strip Check blood sugar once a day. Dx 250.00  300 each  4  . ONETOUCH DELICA LANCETS 84Z MISC 1 Device by Does not apply route daily. DX 250.00  300 each  4  . fenofibrate 160 MG tablet Take 1 tablet (160 mg total) by mouth daily.  90 tablet  3   No current facility-administered medications on file prior to visit.   He is allergic to ace inhibitors.  Review of Systems  Review of Systems  Constitutional: Negative for activity change, appetite change and fatigue.  HENT: Negative for hearing loss, congestion, tinnitus and ear discharge.   Eyes: Negative for visual disturbance (see optho q1y -- vision corrected to 20/20 with glasses).  Respiratory: Negative for cough, chest tightness and shortness of breath.   Cardiovascular: Negative for chest pain, palpitations and leg swelling.  Gastrointestinal: Negative for abdominal pain, diarrhea, constipation and abdominal distention.  Genitourinary: Negative for urgency, frequency, decreased urine volume and difficulty urinating.  Musculoskeletal: Negative for back pain, arthralgias and gait problem.  Skin: Negative for color change, pallor and rash.  Neurological: Negative for dizziness, light-headedness, numbness---  + headaches Hematological: Negative for adenopathy. Does not bruise/bleed easily.  Psychiatric/Behavioral: Negative for suicidal ideas, confusion,  sleep disturbance, self-injury, dysphoric mood, decreased concentration and agitation.  Pt is able to read and write and can do all ADLs No risk for falling No abuse/ violence in home      Objective:     Vision by Snellen chart:  opth Blood pressure 136/82, pulse 57, temperature 97.7 F (36.5 C), temperature source Oral, height 5' 4.5" (1.638 m), weight 154 lb (69.854 kg), SpO2 98.00%. Body mass index is 26.04 kg/(m^2).  BP 136/82  Pulse 57  Temp(Src) 97.7 F (36.5 C) (Oral)  Ht 5' 4.5" (1.638 m)  Wt 154 lb (69.854 kg)  BMI 26.04 kg/m2  SpO2 98% General appearance: alert, cooperative, appears stated age and no distress Head: Normocephalic, without obvious abnormality, atraumatic Eyes: negative findings: lids and lashes normal and pupils equal, round, reactive to light and accomodation Ears: normal TM's and external ear canals both ears Nose: Nares normal. Septum midline. Mucosa normal. No drainage or sinus tenderness. Throat: lips, mucosa, and tongue normal; teeth and gums normal Neck: no adenopathy, supple, symmetrical, trachea midline and thyroid not enlarged, symmetric, no tenderness/mass/nodules Back: symmetric, no curvature. ROM normal. No CVA tenderness. Lungs: clear to auscultation bilaterally Chest wall: no tenderness Heart: regular rate and rhythm, S1, S2 normal, no murmur, click, rub or gallop Abdomen: soft, non-tender; bowel sounds normal; no masses,  no organomegaly Male genitalia: normal, penis: no lesions or discharge. testes: no masses or tenderness. no hernias Rectal: soft brown guaiac negative stool noted and prostate slightly enlarged Extremities: extremities normal, atraumatic, no cyanosis or edema Pulses: 2+ and symmetric Skin: Skin color, texture, turgor normal. No rashes or lesions Lymph nodes: Cervical, supraclavicular, and axillary nodes normal. Neurologic: Alert and oriented X 3, normal strength and tone. Normal symmetric reflexes. Normal coordination  and gait Psych-- no depression, no anxiety      Assessment:     cpe      Plan:     During the course of the visit the patient was educated and counseled about appropriate screening and preventive services including:    Pneumococcal vaccine   Influenza vaccine  Prostate cancer screening  Diabetes screening  Glaucoma screening  Advanced directives: has NO advanced directive - not interested in additional information  Diet review for nutrition referral? Yes ____  Not Indicated x   Patient Instructions (the written plan) was given to the patient.  Medicare Attestation I have personally reviewed: The patient's medical and social history Their use of alcohol, tobacco or illicit drugs Their current medications and supplements The patient's functional ability including ADLs,fall risks, home safety risks, cognitive, and hearing and visual impairment Diet and physical activities Evidence for depression or mood disorders  The patient's weight, height, BMI, and visual acuity have been recorded in the chart.  I have made referrals, counseling, and provided education to the patient based on review of the above and I have provided the patient with a written personalized care plan for preventive services.    1. Worsening headaches Over last 6 months - MR Brain Wo Contrast; Future - Sedimentation rate  2. Type II or unspecified type diabetes mellitus with neurological manifestations, not stated as uncontrolled Check labs con't meds - Basic metabolic panel - Hemoglobin A1c - Microalbumin / creatinine urine ratio - POCT urinalysis dipstick  3. Essential hypertension Stable, con't meds - Basic metabolic panel - CBC with Differential - Microalbumin / creatinine urine ratio - POCT urinalysis dipstick  4. Other and unspecified hyperlipidemia Check labs - Hepatic function panel - Lipid panel  5. BPH (benign prostatic hypertrophy) Check labs - PSA  6. Need for  pneumococcal vaccination   - Pneumococcal conjugate vaccine 13-valent   Garnet Koyanagi, DO   09/08/2013

## 2013-09-08 NOTE — Progress Notes (Deleted)
   Subjective:    Patient ID: Eddie Mcdaniel, male    DOB: May 22, 1940, 73 y.o.   MRN: [id number]  HPI    Review of Systems  Constitutional: Negative.   HENT: Negative for congestion, ear pain, hearing loss, nosebleeds, postnasal drip, rhinorrhea, sinus pressure, sneezing and tinnitus.   Eyes: Negative for photophobia, discharge, itching and visual disturbance.  Respiratory: Negative.   Cardiovascular: Negative.   Gastrointestinal: Negative for abdominal pain, constipation, blood in stool, abdominal distention and anal bleeding.  Endocrine: Negative.   Genitourinary: Negative.   Musculoskeletal: Negative.   Skin: Negative.   Allergic/Immunologic: Negative.   Neurological: Negative for dizziness, weakness, light-headedness, numbness and headaches.  Psychiatric/Behavioral: Negative for suicidal ideas, confusion, sleep disturbance, dysphoric mood, decreased concentration and agitation. The patient is not nervous/anxious.        Objective:   Physical Exam  Nursing note and vitals reviewed. Constitutional: He is oriented to person, place, and time. He appears well-developed and well-nourished. No distress.  HENT:  Head: Normocephalic and atraumatic.  Right Ear: External ear normal.  Left Ear: External ear normal.  Nose: Nose normal.  Mouth/Throat: Oropharynx is clear and moist. No oropharyngeal exudate.  Eyes: Conjunctivae and EOM are normal. Pupils are equal, round, and reactive to light. Right eye exhibits no discharge. Left eye exhibits no discharge.  Neck: Normal range of motion. Neck supple. No JVD present. No thyromegaly present.  Cardiovascular: Normal rate, regular rhythm and intact distal pulses.  Exam reveals no gallop and no friction rub.   No murmur heard. Pulmonary/Chest: Effort normal and breath sounds normal. No respiratory distress. He has no wheezes. He has no rales. He exhibits no tenderness.  Abdominal: Soft. Bowel sounds are normal. He exhibits no distension  and no mass. There is no tenderness. There is no rebound and no guarding.  Genitourinary: Rectum normal, prostate normal and penis normal. Guaiac negative stool.  Musculoskeletal: Normal range of motion. He exhibits no edema and no tenderness.  Lymphadenopathy:    He has no cervical adenopathy.  Neurological: He is alert and oriented to person, place, and time. He displays normal reflexes. He exhibits normal muscle tone.  Skin: Skin is warm and dry. No rash noted. He is not diaphoretic. No erythema. No pallor.  Psychiatric: He has a normal mood and affect. His behavior is normal. Judgment and thought content normal.          Assessment & Plan:

## 2013-09-08 NOTE — Progress Notes (Signed)
Pre visit review using our clinic review tool, if applicable. No additional management support is needed unless otherwise documented below in the visit note. 

## 2013-09-08 NOTE — Telephone Encounter (Signed)
Caller name: Courtenay, Creger  Relation to pt: self  Call back number: [phone]   Reason for call:  Pt would like to discuss Radiology appointment scheduled for tomorrow at 2pm unsure if appointment requires a referral.

## 2013-09-09 ENCOUNTER — Ambulatory Visit (HOSPITAL_BASED_OUTPATIENT_CLINIC_OR_DEPARTMENT_OTHER)
Admission: RE | Admit: 2013-09-09 | Discharge: 2013-09-09 | Disposition: A | Discharge status: Home / Self Care | Payer: Medicare Other | Source: Ambulatory Visit | Attending: Family Medicine | Admitting: Family Medicine

## 2013-09-09 DIAGNOSIS — R51 Headache: Secondary | ICD-10-CM | POA: Diagnosis present

## 2013-09-09 DIAGNOSIS — R519 Headache, unspecified: Secondary | ICD-10-CM

## 2013-09-15 ENCOUNTER — Telehealth: Payer: Self-pay

## 2013-09-15 NOTE — Telephone Encounter (Signed)
Patient aware and said he has his upcoming specialist appointments this month.     KP

## 2013-09-15 NOTE — Telephone Encounter (Signed)
Notes Recorded by Ewing Schlein, CMA on 09/10/2013 at 4:10 PM Patient has been made aware of the results and recommendations and voiced understanding, No referral at this time, copy mailed KP ------  Notes Recorded by Rosalita Chessman, DO on 09/10/2013 at 8:34 AM Brain normal-- neuro referral if con't

## 2013-09-15 NOTE — Telephone Encounter (Signed)
I discussed the results with the patient last week, However I did call back to discuss. MSG left to call the office     KP

## 2013-09-15 NOTE — Telephone Encounter (Signed)
Caller name:Terran Relation to pt: Call back [phone] Pharmacy:  Reason for call: Gergory called to see if you had received his MRI results yet. Please call

## 2013-09-18 ENCOUNTER — Other Ambulatory Visit: Payer: Self-pay | Admitting: Nephrology

## 2013-09-18 DIAGNOSIS — N181 Chronic kidney disease, stage 1: Secondary | ICD-10-CM

## 2013-09-18 DIAGNOSIS — R809 Proteinuria, unspecified: Secondary | ICD-10-CM

## 2013-09-23 ENCOUNTER — Ambulatory Visit
Admission: RE | Admit: 2013-09-23 | Discharge: 2013-09-23 | Disposition: A | Discharge status: Home / Self Care | Payer: Medicare Other | Source: Ambulatory Visit | Attending: Nephrology | Admitting: Nephrology

## 2013-09-23 DIAGNOSIS — R809 Proteinuria, unspecified: Secondary | ICD-10-CM

## 2013-09-23 DIAGNOSIS — N181 Chronic kidney disease, stage 1: Secondary | ICD-10-CM

## 2013-09-29 ENCOUNTER — Other Ambulatory Visit: Payer: Self-pay

## 2013-09-29 MED ORDER — ATORVASTATIN CALCIUM 10 MG PO TABS: 10.0000 mg | ORAL_TABLET | Freq: Every day | ORAL | Status: DC

## 2013-09-29 MED ORDER — CARVEDILOL 6.25 MG PO TABS: ORAL_TABLET | ORAL | Status: DC

## 2013-09-29 MED ORDER — METFORMIN HCL ER 750 MG PO TB24: ORAL_TABLET | ORAL | Status: DC

## 2013-10-12 ENCOUNTER — Telehealth: Payer: Self-pay | Admitting: Family Medicine

## 2013-12-17 ENCOUNTER — Ambulatory Visit (INDEPENDENT_AMBULATORY_CARE_PROVIDER_SITE_OTHER): Payer: Medicare Other

## 2013-12-17 DIAGNOSIS — Z23 Encounter for immunization: Secondary | ICD-10-CM

## 2013-12-17 NOTE — Progress Notes (Signed)
Pre visit review using our clinic review tool, if applicable. No additional management support is needed unless otherwise documented below in the visit note. 

## 2013-12-17 NOTE — Progress Notes (Signed)
Pt tolerated injection well.  No signs of a reaction upon leaving the clinic.   

## 2014-01-01 ENCOUNTER — Encounter: Payer: Self-pay | Admitting: Family Medicine

## 2014-01-01 ENCOUNTER — Ambulatory Visit (INDEPENDENT_AMBULATORY_CARE_PROVIDER_SITE_OTHER): Payer: Medicare Other | Admitting: Family Medicine

## 2014-01-01 VITALS — BP 140/80 | HR 67 | Temp 97.8°F | Wt 155.6 lb

## 2014-01-01 DIAGNOSIS — L218 Other seborrheic dermatitis: Secondary | ICD-10-CM

## 2014-01-01 DIAGNOSIS — E1129 Type 2 diabetes mellitus with other diabetic kidney complication: Secondary | ICD-10-CM

## 2014-01-01 DIAGNOSIS — I10 Essential (primary) hypertension: Secondary | ICD-10-CM

## 2014-01-01 DIAGNOSIS — E785 Hyperlipidemia, unspecified: Secondary | ICD-10-CM

## 2014-01-01 DIAGNOSIS — IMO0002 Reserved for concepts with insufficient information to code with codable children: Secondary | ICD-10-CM

## 2014-01-01 DIAGNOSIS — R319 Hematuria, unspecified: Secondary | ICD-10-CM

## 2014-01-01 DIAGNOSIS — E1165 Type 2 diabetes mellitus with hyperglycemia: Secondary | ICD-10-CM

## 2014-01-01 DIAGNOSIS — R8290 Unspecified abnormal findings in urine: Secondary | ICD-10-CM

## 2014-01-01 DIAGNOSIS — L219 Seborrheic dermatitis, unspecified: Secondary | ICD-10-CM

## 2014-01-01 LAB — POCT URINALYSIS DIPSTICK
Bilirubin, UA: NEGATIVE
Glucose, UA: NEGATIVE
Ketones, UA: NEGATIVE
Leukocytes, UA: NEGATIVE
Nitrite, UA: NEGATIVE
Spec Grav, UA: >=1.03
UROBILINOGEN UA: 0.2
pH, UA: 6

## 2014-01-01 MED ORDER — CICLOPIROX 1 % EX SHAM: MEDICATED_SHAMPOO | CUTANEOUS | Status: DC

## 2014-01-01 NOTE — Addendum Note (Signed)
Addended by: Harl Bowie on: 01/01/2014 10:22 AM   Modules accepted: Orders

## 2014-01-01 NOTE — Progress Notes (Signed)
Subjective:    Patient ID: Eddie Mcdaniel, male    DOB: Jun 26, 1940, 73 y.o.   MRN: [id number]  HPI  HPI HYPERTENSION  Blood pressure range-not checking at home  Chest pain- no      Dyspnea- no Lightheadedness- no   Edema- no Other side effects - no   Medication compliance: good Low salt diet- yes  DIABETES  Blood Sugar ranges-115-129  Polyuria- no New Visual problems- no Hypoglycemic symptoms- no Other side effects-no Medication compliance - good Last eye exam- done per pt Foot exam- today  HYPERLIPIDEMIA  Medication compliance- good RUQ pain- no  Muscle aches- no Other side effects-no  Past Medical History  Diagnosis Date  . GERD (gastroesophageal reflux disease)   . Hyperlipidemia   . Hypertension   . Diabetes mellitus     Type 2   History   Social History  . Marital Status: Married    Spouse Name: N/A    Number of Children: N/A  . Years of Education: N/A   Occupational History  . retired    Social History Main Topics  . Smoking status: Never Smoker   . Smokeless tobacco: Former Systems developer    Types: Chew    Quit date: 04/30/2010  . Alcohol Use: No  . Drug Use: No  . Sexual Activity:    Partners: Female   Other Topics Concern  . Not on file   Social History Narrative   Widower         Current Outpatient Prescriptions  Medication Sig Dispense Refill  . aspirin 81 MG chewable tablet Chew 81 mg by mouth daily.      Marland Kitchen atorvastatin (LIPITOR) 10 MG tablet Take 1 tablet (10 mg total) by mouth daily. 90 tablet 1  . carvedilol (COREG) 6.25 MG tablet TAKE 1 TABLET TWICE A DAY WITH A MEAL 180 tablet 1  . clotrimazole-betamethasone (LOTRISONE) cream Apply topically 2 (two) times daily. 30 g 0  . fenofibrate 160 MG tablet Take 1 tablet (160 mg total) by mouth daily. 90 tablet 3  . Flaxseed, Linseed, (FLAX SEED OIL) 1000 MG CAPS Take 1 capsule by mouth daily.    . metFORMIN (GLUCOPHAGE-XR) 750 MG 24 hr tablet 1 tablet by mouth twice daily--labs are due  now 180 tablet 1  . multivitamin-iron-minerals-folic acid (CENTRUM) chewable tablet Chew 1 tablet by mouth 2 (two) times daily.      . ONE TOUCH ULTRA TEST test strip Check blood sugar once a day. Dx 250.00 300 each 4  . ONETOUCH DELICA LANCETS 36O MISC 1 Device by Does not apply route daily. DX 250.00 300 each 4   No current facility-administered medications for this visit.     Review of Systems As abpve    Objective:   Physical Exam There were no vitals taken for this visit. General appearance: alert, cooperative, appears stated age and no distress Eyes: conjunctivae/corneas clear. PERRL, EOM's intact. Fundi benign. Neck: no adenopathy, no carotid bruit, no JVD, supple, symmetrical, trachea midline and thyroid not enlarged, symmetric, no tenderness/mass/nodules Lungs: clear to auscultation bilaterally Heart: S1, S2 normal Extremities: extremities normal, atraumatic, no cyanosis or edema  Sensory exam of the foot is normal, tested with the monofilament. Good pulses, no lesions or ulcers, good peripheral pulses.        Assessment & Plan:  1. Seborrheic dermatitis of scalp  - Ciclopirox 1 % shampoo; 5-10 ml massage into scalp, leave on 3 minutes and rinse--2x a week for 4 weeks  Dispense: 120 mL; Refill: 1  2. Type II diabetes mellitus with renal manifestations, uncontrolled Check labs, con't meds - Basic metabolic panel - Hemoglobin A1c - Microalbumin / creatinine urine ratio - POCT urinalysis dipstick  3. Essential hypertension Stable, con't meds  4. Hyperlipidemia Check labs - Hepatic function panel - Lipid panel

## 2014-01-02 LAB — URINE CULTURE
COLONY COUNT: NO GROWTH
ORGANISM ID, BACTERIA: NO GROWTH

## 2014-01-03 LAB — HEPATIC FUNCTION PANEL
ALT: 19 U/L (ref 0–53)
AST: 22 U/L (ref 0–37)
Albumin: 4.3 g/dL (ref 3.5–5.2)
Alkaline Phosphatase: 42 U/L (ref 39–117)
BILIRUBIN TOTAL: 0.8 mg/dL (ref 0.2–1.2)
Bilirubin, Direct: 0.1 mg/dL (ref 0.0–0.3)
TOTAL PROTEIN: 7.1 g/dL (ref 6.0–8.3)

## 2014-01-03 LAB — BASIC METABOLIC PANEL
BUN: 10 mg/dL (ref 6–23)
CALCIUM: 9.3 mg/dL (ref 8.4–10.5)
CO2: 26 meq/L (ref 19–32)
CREATININE: 0.8 mg/dL (ref 0.4–1.5)
Chloride: 102 mEq/L (ref 96–112)
GFR: 104.1 mL/min (ref >60.00)
GLUCOSE: 129 mg/dL — AB (ref 70–99)
Potassium: 4.1 mEq/L (ref 3.5–5.1)
Sodium: 136 mEq/L (ref 135–145)

## 2014-01-03 LAB — MICROALBUMIN / CREATININE URINE RATIO
CREATININE, U: 106.8 mg/dL
MICROALB/CREAT RATIO: 4.4 mg/g (ref 0.0–30.0)
Microalb, Ur: 4.7 mg/dL — ABNORMAL HIGH (ref 0.0–1.9)

## 2014-01-03 LAB — LIPID PANEL
CHOLESTEROL: 119 mg/dL (ref 0–200)
HDL: 31.9 mg/dL — ABNORMAL LOW (ref >39.00)
LDL Cholesterol: 61 mg/dL (ref 0–99)
NonHDL: 87.1
Total CHOL/HDL Ratio: 4
Triglycerides: 129 mg/dL (ref 0.0–149.0)
VLDL: 25.8 mg/dL (ref 0.0–40.0)

## 2014-01-03 LAB — HEMOGLOBIN A1C: Hgb A1c MFr Bld: 6.8 % — ABNORMAL HIGH (ref 4.6–6.5)

## 2014-01-07 ENCOUNTER — Telehealth: Payer: Self-pay | Admitting: Family Medicine

## 2014-01-07 MED ORDER — CARVEDILOL 6.25 MG PO TABS: ORAL_TABLET | ORAL | Status: DC

## 2014-01-07 MED ORDER — METFORMIN HCL ER 750 MG PO TB24: ORAL_TABLET | ORAL | Status: DC

## 2014-01-07 NOTE — Telephone Encounter (Signed)
Caller name:Tiran Relation to pt: Call back number: [phone] [place]  Reason for call: Pt came in office requesting needing rx metFORMIN (GLUCOPHAGE-XR) 750 MG 24 hr tablet and carvedilol (COREG) 6.25 MG tablet for 90 days supply. Pt states would like prescription sent to pharmacy here [facility]. Pt states if possible to be sent in today since already in area and does live in [place]. Please Advice.

## 2014-01-07 NOTE — Telephone Encounter (Signed)
Rx Faxed    KP 

## 2014-01-25 ENCOUNTER — Encounter: Payer: Self-pay | Admitting: Family Medicine

## 2014-01-25 ENCOUNTER — Ambulatory Visit (INDEPENDENT_AMBULATORY_CARE_PROVIDER_SITE_OTHER): Payer: Medicare Other | Admitting: Family Medicine

## 2014-01-25 VITALS — BP 130/84 | HR 61 | Temp 97.5°F | Wt 153.0 lb

## 2014-01-25 DIAGNOSIS — B350 Tinea barbae and tinea capitis: Secondary | ICD-10-CM

## 2014-01-25 MED ORDER — BETAMETHASONE VALERATE 0.1 % EX OINT: 1.0000 "application " | TOPICAL_OINTMENT | Freq: Two times a day (BID) | CUTANEOUS | Status: DC

## 2014-01-25 NOTE — Progress Notes (Signed)
   Subjective:    Patient ID: Eddie Mcdaniel, male    DOB: 14-Jun-1940, 74 y.o.   MRN: [id number]  HPI Pt here c/o cont itchy scalp and bumps on scalp.  See last ov.  Shampoo , ciclopirox, did not help.  It is not worsening but it is no better either.   Pt also states he has not taken fenofibrate for years and wonders if he needs it.     Review of Systems  Constitutional: Negative.   Respiratory: Negative.   Cardiovascular: Negative.   Skin: Positive for rash. Negative for color change and pallor.  Allergic/Immunologic: Negative.        Objective:   Physical Exam BP 130/84 mmHg  Pulse 61  Temp(Src) 97.5 F (36.4 C) (Oral)  Wt 153 lb (69.4 kg)  SpO2 98% General appearance: alert, cooperative, appears stated age and no distress Head: Normocephalic, without obvious abnormality, atraumatic Neck: no adenopathy, supple, symmetrical, trachea midline and thyroid not enlarged, symmetric, no tenderness/mass/nodules Lungs: clear to auscultation bilaterally Heart: S1, S2 normal Skin: Skin color, texture, turgor normal. No rashes or lesions or papular - scalp       Assessment & Plan:  1. Scalp dermatophytosis Pt to see derm tomorrow He may decide to wait on rx until then - Ambulatory referral to Dermatology - betamethasone valerate ointment (VALISONE) 0.1 %; Apply 1 application topically 2 (two) times daily.  Dispense: 30 g; Refill: 0  2.  Hyperlipidemia-- con't lipitor      Ok to con't to not take fenofibrate Lab Results  Component Value Date   CHOL 119 01/01/2014   HDL 31.90* 01/01/2014   LDLCALC 61 01/01/2014   TRIG 129.0 01/01/2014   CHOLHDL 4 01/01/2014

## 2014-01-25 NOTE — Progress Notes (Signed)
Pre visit review using our clinic review tool, if applicable. No additional management support is needed unless otherwise documented below in the visit note. 

## 2014-04-07 ENCOUNTER — Other Ambulatory Visit: Payer: Commercial Managed Care - HMO

## 2014-04-07 ENCOUNTER — Other Ambulatory Visit: Payer: Self-pay | Admitting: *Deleted

## 2014-04-07 MED ORDER — CARVEDILOL 6.25 MG PO TABS: ORAL_TABLET | ORAL | Status: DC

## 2014-04-07 MED ORDER — METFORMIN HCL ER 750 MG PO TB24: ORAL_TABLET | ORAL | Status: DC

## 2014-04-07 MED ORDER — ATORVASTATIN CALCIUM 10 MG PO TABS: 10.0000 mg | ORAL_TABLET | Freq: Every day | ORAL | Status: DC

## 2014-04-07 NOTE — Telephone Encounter (Signed)
Rx's sent to the pharmacy by e-script.//AB/CMA 

## 2014-05-20 NOTE — Telephone Encounter (Signed)
error:[id number] ° °

## 2014-06-15 ENCOUNTER — Telehealth: Payer: Self-pay | Admitting: Family Medicine

## 2014-06-15 NOTE — Telephone Encounter (Signed)
Patient request CPE, Apt scheduled, he has switched insurance companies from [employer] to [employer].  KP

## 2014-06-15 NOTE — Telephone Encounter (Signed)
Caller: Yariel Ferraris Ph#: [phone]  Pt requesting phone call about follow up appts needed. He was reading information from his last AVS and had a question.

## 2014-08-03 ENCOUNTER — Telehealth: Payer: Self-pay | Admitting: Family Medicine

## 2014-08-03 NOTE — Telephone Encounter (Signed)
pre visit letter mailed 07/29/14

## 2014-08-06 ENCOUNTER — Telehealth: Payer: Self-pay | Admitting: Family Medicine

## 2014-08-06 DIAGNOSIS — E119 Type 2 diabetes mellitus without complications: Secondary | ICD-10-CM

## 2014-08-06 DIAGNOSIS — Z0100 Encounter for examination of eyes and vision without abnormal findings: Secondary | ICD-10-CM

## 2014-08-06 LAB — HM DIABETES EYE EXAM

## 2014-08-06 NOTE — Telephone Encounter (Signed)
Caller name: Brandun Relation to pt: Call back number: [phone] Pharmacy:  Reason for call:   Patient is requesting a referral to see Dr. Katy Fitch. [employer] insurance. He is there now. I talked to [place] at Dr. Katy Fitch office says that patient is being seen for a diabetic eye exam and DX code is: E11.9. Dr. Katy Fitch NPI # [id number]

## 2014-08-06 NOTE — Telephone Encounter (Signed)
Rx faxed.    KP 

## 2014-08-18 ENCOUNTER — Telehealth: Payer: Self-pay

## 2014-08-18 NOTE — Telephone Encounter (Signed)
Pre visit call completed 

## 2014-08-18 NOTE — Telephone Encounter (Signed)
LMOVM

## 2014-08-19 ENCOUNTER — Encounter: Payer: Self-pay | Admitting: Family Medicine

## 2014-08-19 ENCOUNTER — Ambulatory Visit (INDEPENDENT_AMBULATORY_CARE_PROVIDER_SITE_OTHER): Payer: Commercial Managed Care - HMO | Admitting: Family Medicine

## 2014-08-19 VITALS — BP 158/82 | HR 56 | Temp 97.8°F | Ht 65.0 in | Wt 150.2 lb

## 2014-08-19 DIAGNOSIS — R351 Nocturia: Secondary | ICD-10-CM | POA: Diagnosis not present

## 2014-08-19 DIAGNOSIS — G990 Autonomic neuropathy in diseases classified elsewhere: Secondary | ICD-10-CM | POA: Diagnosis not present

## 2014-08-19 DIAGNOSIS — E785 Hyperlipidemia, unspecified: Secondary | ICD-10-CM | POA: Diagnosis not present

## 2014-08-19 DIAGNOSIS — E1143 Type 2 diabetes mellitus with diabetic autonomic (poly)neuropathy: Secondary | ICD-10-CM

## 2014-08-19 DIAGNOSIS — E1142 Type 2 diabetes mellitus with diabetic polyneuropathy: Secondary | ICD-10-CM

## 2014-08-19 DIAGNOSIS — Z0000 Encounter for general adult medical examination without abnormal findings: Secondary | ICD-10-CM | POA: Diagnosis not present

## 2014-08-19 DIAGNOSIS — I10 Essential (primary) hypertension: Secondary | ICD-10-CM

## 2014-08-19 LAB — POCT URINALYSIS DIPSTICK
Bilirubin, UA: NEGATIVE
GLUCOSE UA: NEGATIVE
KETONES UA: NEGATIVE
Leukocytes, UA: NEGATIVE
Nitrite, UA: NEGATIVE
PROTEIN UA: NEGATIVE
RBC UA: NEGATIVE
Spec Grav, UA: 1.025
UROBILINOGEN UA: NEGATIVE
pH, UA: 6

## 2014-08-19 LAB — CBC WITH DIFFERENTIAL/PLATELET
BASOS ABS: 0 10*3/uL (ref 0.0–0.1)
Basophils Relative: 0.4 % (ref 0.0–3.0)
EOS PCT: 3.4 % (ref 0.0–5.0)
Eosinophils Absolute: 0.3 10*3/uL (ref 0.0–0.7)
HCT: 39.9 % (ref 39.0–52.0)
Hemoglobin: 13.4 g/dL (ref 13.0–17.0)
Lymphocytes Relative: 25.5 % (ref 12.0–46.0)
Lymphs Abs: 2.2 10*3/uL (ref 0.7–4.0)
MCHC: 33.6 g/dL (ref 30.0–36.0)
MCV: 86.7 fl (ref 78.0–100.0)
MONO ABS: 0.6 10*3/uL (ref 0.1–1.0)
MONOS PCT: 7.3 % (ref 3.0–12.0)
NEUTROS ABS: 5.3 10*3/uL (ref 1.4–7.7)
NEUTROS PCT: 63.4 % (ref 43.0–77.0)
Platelets: 166 10*3/uL (ref 150.0–400.0)
RBC: 4.6 Mil/uL (ref 4.22–5.81)
RDW: 13.6 % (ref 11.5–15.5)
WBC: 8.4 10*3/uL (ref 4.0–10.5)

## 2014-08-19 LAB — BASIC METABOLIC PANEL
BUN: 11 mg/dL (ref 6–23)
CO2: 30 mEq/L (ref 19–32)
Calcium: 9.7 mg/dL (ref 8.4–10.5)
Chloride: 101 mEq/L (ref 96–112)
Creatinine, Ser: 0.76 mg/dL (ref 0.40–1.50)
GFR: 107.08 mL/min (ref >60.00)
GLUCOSE: 107 mg/dL — AB (ref 70–99)
Potassium: 4.8 mEq/L (ref 3.5–5.1)
Sodium: 138 mEq/L (ref 135–145)

## 2014-08-19 LAB — PSA: PSA: 7.26 ng/mL — ABNORMAL HIGH (ref 0.10–4.00)

## 2014-08-19 LAB — MICROALBUMIN / CREATININE URINE RATIO
Creatinine,U: 150.5 mg/dL
MICROALB UR: 2.1 mg/dL — AB (ref 0.0–1.9)
Microalb Creat Ratio: 1.4 mg/g (ref 0.0–30.0)

## 2014-08-19 LAB — HEPATIC FUNCTION PANEL
ALBUMIN: 4.6 g/dL (ref 3.5–5.2)
ALK PHOS: 48 U/L (ref 39–117)
ALT: 14 U/L (ref 0–53)
AST: 19 U/L (ref 0–37)
Bilirubin, Direct: 0.2 mg/dL (ref 0.0–0.3)
TOTAL PROTEIN: 7.2 g/dL (ref 6.0–8.3)
Total Bilirubin: 0.8 mg/dL (ref 0.2–1.2)

## 2014-08-19 LAB — LIPID PANEL
CHOLESTEROL: 120 mg/dL (ref 0–200)
HDL: 35.8 mg/dL — ABNORMAL LOW (ref >39.00)
LDL CALC: 61 mg/dL (ref 0–99)
NonHDL: 84.2
Total CHOL/HDL Ratio: 3
Triglycerides: 116 mg/dL (ref 0.0–149.0)
VLDL: 23.2 mg/dL (ref 0.0–40.0)

## 2014-08-19 LAB — HEMOGLOBIN A1C: HEMOGLOBIN A1C: 6.3 % (ref 4.6–6.5)

## 2014-08-19 NOTE — Progress Notes (Signed)
Subjective:   Eddie Mcdaniel is a 74 y.o. male who presents for Medicare Annual/Subsequent preventive examination.   He c/o nocturia---  4x at night and RLS.   Symptoms x6 months.    HPI HYPERTENSION  Blood pressure range-not checking   Chest pain- no      Dyspnea- no Lightheadedness- no   Edema- no Other side effects - no   Medication compliance: good Low salt diet- yes  DIABETES  Blood Sugar ranges-123--131  Polyuria- no New Visual problems-no Hypoglycemic symptoms- no Other side effects-no Medication compliance - good Last eye exam- 08/06/14 Foot exam- today  HYPERLIPIDEMIA  Medication compliance- good RUQ pain- no  Muscle aches- no Other side effects-no     Review of Systems:   Review of Systems  Constitutional: Negative for activity change, appetite change and + fatigue HENT: Negative for hearing loss, congestion, tinnitus and ear discharge.   Eyes: Negative for visual disturbance (see optho q1y -- vision corrected to 20/20 with glasses).  Respiratory: Negative for cough, chest tightness and shortness of breath.   Cardiovascular: Negative for chest pain, palpitations and leg swelling.  Gastrointestinal: Negative for abdominal pain, diarrhea, constipation and abdominal distention.  Genitourinary: Negative for urgency, frequency, decreased urine volume and difficulty urinating.  Musculoskeletal: Negative for back pain, arthralgias and gait problem. + RLS Skin: Negative for color change, pallor and rash.  Neurological: Negative for dizziness, light-headedness, numbness and headaches.  Hematological: Negative for adenopathy. Does not bruise/bleed easily.  Psychiatric/Behavioral: Negative for suicidal ideas, confusion, sleep disturbance, self-injury, dysphoric mood, decreased concentration and agitation.  Pt is able to read and write and can do all ADLs No risk for falling No abuse/ violence in home           Objective:    Vitals: BP 158/82 mmHg   Pulse 56  Temp(Src) 97.8 F (36.6 C) (Oral)  Ht 5\' 5"  (1.651 m)  Wt 150 lb 3.2 oz (68.13 kg)  BMI 24.99 kg/m2  SpO2 98% BP 158/82 mmHg  Pulse 56  Temp(Src) 97.8 F (36.6 C) (Oral)  Ht 5\' 5"  (1.651 m)  Wt 150 lb 3.2 oz (68.13 kg)  BMI 24.99 kg/m2  SpO2 98% General appearance: alert, cooperative, appears stated age and no distress Head: Normocephalic, without obvious abnormality, atraumatic Eyes: conjunctivae/corneas clear. PERRL, EOM's intact. Fundi benign. Ears: normal TM's and external ear canals both ears Nose: Nares normal. Septum midline. Mucosa normal. No drainage or sinus tenderness. Throat: lips, mucosa, and tongue normal; teeth and gums normal Neck: no adenopathy, no carotid bruit, no JVD, supple, symmetrical, trachea midline and thyroid not enlarged, symmetric, no tenderness/mass/nodules Back: symmetric, no curvature. ROM normal. No CVA tenderness. Lungs: clear to auscultation bilaterally Chest wall: no tenderness Heart: regular rate and rhythm, S1, S2 normal, no murmur, click, rub or gallop Abdomen: soft, non-tender; bowel sounds normal; no masses,  no organomegaly Male genitalia: deferred--urology Rectal: deferred --urology Extremities: extremities normal, atraumatic, no cyanosis or edema Pulses: 2+ and symmetric Skin: Skin color, texture, turgor normal. No rashes or lesions Lymph nodes: Cervical, supraclavicular, and axillary nodes normal. Neurologic: Alert and oriented X 3, normal strength and tone. Normal symmetric reflexes. Normal coordination and gait Psych- no depression, no anxiety Tobacco History  Smoking status  . Never Smoker   Smokeless tobacco  . Former Systems developer  . Types: Chew  . Quit date: 04/30/2010     Counseling given: Not Answered   Past Medical History  Diagnosis Date  . GERD (gastroesophageal reflux  disease)   . Hyperlipidemia   . Hypertension   . Diabetes mellitus     Type 2   Past Surgical History  Procedure Laterality Date  .  Shoulder arthroscopy w/ rotator cuff repair      2x   . Eye surgery  2005   Family History  Problem Relation Age of Onset  . Diabetes Mother   . Hypertension Mother   . Cancer Brother 38    brain, stomach  . Hyperlipidemia Brother   . Heart disease Brother     MI  . Heart disease Brother     MI  . Diabetes Brother   . Stroke Brother   . Hypertension Brother    History  Sexual Activity  . Sexual Activity:  . Partners: Female    Outpatient Encounter Prescriptions as of 08/19/2014  Medication Sig  . aspirin 81 MG chewable tablet Chew 81 mg by mouth daily.    Marland Kitchen atorvastatin (LIPITOR) 10 MG tablet Take 1 tablet (10 mg total) by mouth daily.  . carvedilol (COREG) 6.25 MG tablet TAKE 1 TABLET TWICE A DAY WITH A MEAL  . CINNAMON PO Take by mouth.  . metFORMIN (GLUCOPHAGE-XR) 750 MG 24 hr tablet 1 tablet by mouth twice daily  . ONE TOUCH ULTRA TEST test strip Check blood sugar once a day. Dx 250.00  . ONETOUCH DELICA LANCETS 73X MISC 1 Device by Does not apply route daily. DX 250.00  . Turmeric 450 MG CAPS Take 1 tablet by mouth daily.  . [DISCONTINUED] betamethasone valerate ointment (VALISONE) 0.1 % Apply 1 application topically 2 (two) times daily.  . [DISCONTINUED] Ciclopirox 1 % shampoo 5-10 ml massage into scalp, leave on 3 minutes and rinse--2x a week for 4 weeks  . [DISCONTINUED] clotrimazole-betamethasone (LOTRISONE) cream Apply topically 2 (two) times daily.  . [DISCONTINUED] Flaxseed, Linseed, (FLAX SEED OIL) 1000 MG CAPS Take 1 capsule by mouth daily.   No facility-administered encounter medications on file as of 08/19/2014.    Activities of Daily Living In your present state of health, do you have any difficulty performing the following activities: 08/19/2014  Hearing? N  Vision? N  Difficulty concentrating or making decisions? N  Walking or climbing stairs? N  Dressing or bathing? N  Doing errands, shopping? N    Patient Care Team: Rosalita Chessman, DO as PCP -  General Rana Snare, MD as Consulting Physician (Urology)   Assessment:    ghm utd Check lab Exercise Activities and Dietary recommendations    Goals    None     Fall Risk Fall Risk  08/19/2014 03/25/2013 12/21/2011  Falls in the past year? No No No   Depression Screen PHQ 2/9 Scores 08/19/2014 03/25/2013 12/21/2011  PHQ - 2 Score 0 0 0    Cognitive Testing aaox 3 mmse 30/30  Immunization History  Administered Date(s) Administered  . Influenza Split 10/30/2010, 12/21/2011  . Influenza Whole 11/18/2009  . Influenza,inj,Quad PF,36+ Mos 03/25/2013, 12/17/2013  . Pneumococcal Conjugate-13 09/08/2013  . Pneumococcal Polysaccharide-23 10/24/2006   Screening Tests Health Maintenance  Topic Date Due  . TETANUS/TDAP  05/14/1961  . COLONOSCOPY  07/27/2009  . HEMOGLOBIN A1C  07/03/2014  . INFLUENZA VACCINE  08/30/2014  . FOOT EXAM  09/09/2014  . PNA vac Low Risk Adult (2 of 2 - PPSV23) 09/09/2014  . URINE MICROALBUMIN  01/02/2015  . OPHTHALMOLOGY EXAM  08/06/2015  . ZOSTAVAX  Addressed      Plan:    During the  course of the visit the patient was educated and counseled about the following appropriate screening and preventive services:   Vaccines to include Pneumoccal, Influenza, Hepatitis B, Td, Zostavax, HCV  Electrocardiogram  Cardiovascular Disease  Colorectal cancer screening  Diabetes screening  Prostate Cancer Screening  Glaucoma screening  Nutrition counseling   Smoking cessation counseling  Patient Instructions (the written plan) was given to the patient.  1. 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 Essential hypertension Check labs - CBC with Differential/Platelet - Basic metabolic panel - Hemoglobin A1c - Hepatic  function panel - Lipid panel - Microalbumin / creatinine urine ratio - POCT urinalysis dipstick  3. Hyperlipidemia Check labs, con' tlipitor - CBC with Differential/Platelet - Basic metabolic panel - Hemoglobin A1c - Hepatic function panel - Lipid panel - Microalbumin / creatinine urine ratio - POCT urinalysis dipstick  4. Nocturia - - PSA  5. Routine history and physical examination of adult     Garnet Koyanagi, DO  08/19/2014

## 2014-08-19 NOTE — Patient Instructions (Signed)
Preventive Care for Adults A healthy lifestyle and preventive care can promote health and wellness. Preventive health guidelines for men include the following key practices:  A routine yearly physical is a good way to check with your health care provider about your health and preventative screening. It is a chance to share any concerns and updates on your health and to receive a thorough exam.  Visit your dentist for a routine exam and preventative care every 6 months. Brush your teeth twice a day and floss once a day. Good oral hygiene prevents tooth decay and gum disease.  The frequency of eye exams is based on your age, health, family medical history, use of contact lenses, and other factors. Follow your health care provider's recommendations for frequency of eye exams.  Eat a healthy diet. Foods such as vegetables, fruits, whole grains, low-fat dairy products, and lean protein foods contain the nutrients you need without too many calories. Decrease your intake of foods high in solid fats, added sugars, and salt. Eat the right amount of calories for you.Get information about a proper diet from your health care provider, if necessary.  Regular physical exercise is one of the most important things you can do for your health. Most adults should get at least 150 minutes of moderate-intensity exercise (any activity that increases your heart rate and causes you to sweat) each week. In addition, most adults need muscle-strengthening exercises on 2 or more days a week.  Maintain a healthy weight. The body mass index (BMI) is a screening tool to identify possible weight problems. It provides an estimate of body fat based on height and weight. Your health care provider can find your BMI and can help you achieve or maintain a healthy weight.For adults 20 years and older:  A BMI below 18.5 is considered underweight.  A BMI of 18.5 to 24.9 is normal.  A BMI of 25 to 29.9 is considered overweight.  A BMI  of 30 and above is considered obese.  Maintain normal blood lipids and cholesterol levels by exercising and minimizing your intake of saturated fat. Eat a balanced diet with plenty of fruit and vegetables. Blood tests for lipids and cholesterol should begin at age 50 and be repeated every 5 years. If your lipid or cholesterol levels are high, you are over 50, or you are at high risk for heart disease, you may need your cholesterol levels checked more frequently.Ongoing high lipid and cholesterol levels should be treated with medicines if diet and exercise are not working.  If you smoke, find out from your health care provider how to quit. If you do not use tobacco, do not start.  Lung cancer screening is recommended for adults aged 73-80 years who are at high risk for developing lung cancer because of a history of smoking. A yearly low-dose CT scan of the lungs is recommended for people who have at least a 30-pack-year history of smoking and are a current smoker or have quit within the past 15 years. A pack year of smoking is smoking an average of 1 pack of cigarettes a day for 1 year (for example: 1 pack a day for 30 years or 2 packs a day for 15 years). Yearly screening should continue until the smoker has stopped smoking for at least 15 years. Yearly screening should be stopped for people who develop a health problem that would prevent them from having lung cancer treatment.  If you choose to drink alcohol, do not have more than  2 drinks per day. One drink is considered to be 12 ounces (355 mL) of beer, 5 ounces (148 mL) of wine, or 1.5 ounces (44 mL) of liquor.  Avoid use of street drugs. Do not share needles with anyone. Ask for help if you need support or instructions about stopping the use of drugs.  High blood pressure causes heart disease and increases the risk of stroke. Your blood pressure should be checked at least every 1-2 years. Ongoing high blood pressure should be treated with  medicines, if weight loss and exercise are not effective.  If you are 45-79 years old, ask your health care provider if you should take aspirin to prevent heart disease.  Diabetes screening involves taking a blood sample to check your fasting blood sugar level. This should be done once every 3 years, after age 45, if you are within normal weight and without risk factors for diabetes. Testing should be considered at a younger age or be carried out more frequently if you are overweight and have at least 1 risk factor for diabetes.  Colorectal cancer can be detected and often prevented. Most routine colorectal cancer screening begins at the age of 50 and continues through age 75. However, your health care provider may recommend screening at an earlier age if you have risk factors for colon cancer. On a yearly basis, your health care provider may provide home test kits to check for hidden blood in the stool. Use of a small camera at the end of a tube to directly examine the colon (sigmoidoscopy or colonoscopy) can detect the earliest forms of colorectal cancer. Talk to your health care provider about this at age 50, when routine screening begins. Direct exam of the colon should be repeated every 5-10 years through age 75, unless early forms of precancerous polyps or small growths are found.  People who are at an increased risk for hepatitis B should be screened for this virus. You are considered at high risk for hepatitis B if:  You were born in a country where hepatitis B occurs often. Talk with your health care provider about which countries are considered high risk.  Your parents were born in a high-risk country and you have not received a shot to protect against hepatitis B (hepatitis B vaccine).  You have HIV or AIDS.  You use needles to inject street drugs.  You live with, or have sex with, someone who has hepatitis B.  You are a man who has sex with other men (MSM).  You get hemodialysis  treatment.  You take certain medicines for conditions such as cancer, organ transplantation, and autoimmune conditions.  Hepatitis C blood testing is recommended for all people born from 1945 through 1965 and any individual with known risks for hepatitis C.  Practice safe sex. Use condoms and avoid high-risk sexual practices to reduce the spread of sexually transmitted infections (STIs). STIs include gonorrhea, chlamydia, syphilis, trichomonas, herpes, HPV, and human immunodeficiency virus (HIV). Herpes, HIV, and HPV are viral illnesses that have no cure. They can result in disability, cancer, and death.  If you are at risk of being infected with HIV, it is recommended that you take a prescription medicine daily to prevent HIV infection. This is called preexposure prophylaxis (PrEP). You are considered at risk if:  You are a man who has sex with other men (MSM) and have other risk factors.  You are a heterosexual man, are sexually active, and are at increased risk for HIV infection.    You take drugs by injection.  You are sexually active with a partner who has HIV.  Talk with your health care provider about whether you are at high risk of being infected with HIV. If you choose to begin PrEP, you should first be tested for HIV. You should then be tested every 3 months for as long as you are taking PrEP.  A one-time screening for abdominal aortic aneurysm (AAA) and surgical repair of large AAAs by ultrasound are recommended for men ages 32 to 67 years who are current or former smokers.  Healthy men should no longer receive prostate-specific antigen (PSA) blood tests as part of routine cancer screening. Talk with your health care provider about prostate cancer screening.  Testicular cancer screening is not recommended for adult males who have no symptoms. Screening includes self-exam, a health care provider exam, and other screening tests. Consult with your health care provider about any symptoms  you have or any concerns you have about testicular cancer.  Use sunscreen. Apply sunscreen liberally and repeatedly throughout the day. You should seek shade when your shadow is shorter than you. Protect yourself by wearing long sleeves, pants, a wide-brimmed hat, and sunglasses year round, whenever you are outdoors.  Once a month, do a whole-body skin exam, using a mirror to look at the skin on your back. Tell your health care provider about new moles, moles that have irregular borders, moles that are larger than a pencil eraser, or moles that have changed in shape or color.  Stay current with required vaccines (immunizations).  Influenza vaccine. All adults should be immunized every year.  Tetanus, diphtheria, and acellular pertussis (Td, Tdap) vaccine. An adult who has not previously received Tdap or who does not know his vaccine status should receive 1 dose of Tdap. This initial dose should be followed by tetanus and diphtheria toxoids (Td) booster doses every 10 years. Adults with an unknown or incomplete history of completing a 3-dose immunization series with Td-containing vaccines should begin or complete a primary immunization series including a Tdap dose. Adults should receive a Td booster every 10 years.  Varicella vaccine. An adult without evidence of immunity to varicella should receive 2 doses or a second dose if he has previously received 1 dose.  Human papillomavirus (HPV) vaccine. Males aged 68-21 years who have not received the vaccine previously should receive the 3-dose series. Males aged 22-26 years may be immunized. Immunization is recommended through the age of 6 years for any male who has sex with males and did not get any or all doses earlier. Immunization is recommended for any person with an immunocompromised condition through the age of 49 years if he did not get any or all doses earlier. During the 3-dose series, the second dose should be obtained 4-8 weeks after the first  dose. The third dose should be obtained 24 weeks after the first dose and 16 weeks after the second dose.  Zoster vaccine. One dose is recommended for adults aged 50 years or older unless certain conditions are present.  Measles, mumps, and rubella (MMR) vaccine. Adults born before [birth year] generally are considered immune to measles and mumps. Adults born in [birth year] or later should have 1 or more doses of MMR vaccine unless there is a contraindication to the vaccine or there is laboratory evidence of immunity to each of the three diseases. A routine second dose of MMR vaccine should be obtained at least 28 days after the first dose for students attending postsecondary  schools, health care workers, or international travelers. People who received inactivated measles vaccine or an unknown type of measles vaccine during 1963-1967 should receive 2 doses of MMR vaccine. People who received inactivated mumps vaccine or an unknown type of mumps vaccine before 1979 and are at high risk for mumps infection should consider immunization with 2 doses of MMR vaccine. Unvaccinated health care workers born before 1957 who lack laboratory evidence of measles, mumps, or rubella immunity or laboratory confirmation of disease should consider measles and mumps immunization with 2 doses of MMR vaccine or rubella immunization with 1 dose of MMR vaccine.  Pneumococcal 13-valent conjugate (PCV13) vaccine. When indicated, a person who is uncertain of his immunization history and has no record of immunization should receive the PCV13 vaccine. An adult aged 19 years or older who has certain medical conditions and has not been previously immunized should receive 1 dose of PCV13 vaccine. This PCV13 should be followed with a dose of pneumococcal polysaccharide (PPSV23) vaccine. The PPSV23 vaccine dose should be obtained at least 8 weeks after the dose of PCV13 vaccine. An adult aged 19 years or older who has certain medical conditions and  previously received 1 or more doses of PPSV23 vaccine should receive 1 dose of PCV13. The PCV13 vaccine dose should be obtained 1 or more years after the last PPSV23 vaccine dose.  Pneumococcal polysaccharide (PPSV23) vaccine. When PCV13 is also indicated, PCV13 should be obtained first. All adults aged 65 years and older should be immunized. An adult younger than age 65 years who has certain medical conditions should be immunized. Any person who resides in a nursing home or long-term care facility should be immunized. An adult smoker should be immunized. People with an immunocompromised condition and certain other conditions should receive both PCV13 and PPSV23 vaccines. People with human immunodeficiency virus (HIV) infection should be immunized as soon as possible after diagnosis. Immunization during chemotherapy or radiation therapy should be avoided. Routine use of PPSV23 vaccine is not recommended for American Indians, [place] Natives, or people younger than 65 years unless there are medical conditions that require PPSV23 vaccine. When indicated, people who have unknown immunization and have no record of immunization should receive PPSV23 vaccine. One-time revaccination 5 years after the first dose of PPSV23 is recommended for people aged 19-64 years who have chronic kidney failure, nephrotic syndrome, asplenia, or immunocompromised conditions. People who received 1-2 doses of PPSV23 before age 65 years should receive another dose of PPSV23 vaccine at age 65 years or later if at least 5 years have passed since the previous dose. Doses of PPSV23 are not needed for people immunized with PPSV23 at or after age 65 years.  Meningococcal vaccine. Adults with asplenia or persistent complement component deficiencies should receive 2 doses of quadrivalent meningococcal conjugate (MenACWY-D) vaccine. The doses should be obtained at least 2 months apart. Microbiologists working with certain meningococcal bacteria,  military recruits, people at risk during an outbreak, and people who travel to or live in countries with a high rate of meningitis should be immunized. A first-year college student up through age 21 years who is living in a residence hall should receive a dose if he did not receive a dose on or after his 16th birthday. Adults who have certain high-risk conditions should receive one or more doses of vaccine.  Hepatitis A vaccine. Adults who wish to be protected from this disease, have certain high-risk conditions, work with hepatitis A-infected animals, work in hepatitis A research labs, or   travel to or work in countries with a high rate of hepatitis A should be immunized. Adults who were previously unvaccinated and who anticipate close contact with an international adoptee during the first 60 days after arrival in the [place] from a country with a high rate of hepatitis A should be immunized.  Hepatitis B vaccine. Adults should be immunized if they wish to be protected from this disease, have certain high-risk conditions, may be exposed to blood or other infectious body fluids, are household contacts or sex partners of hepatitis B positive people, are clients or workers in certain care facilities, or travel to or work in countries with a high rate of hepatitis B.  Haemophilus influenzae type b (Hib) vaccine. A previously unvaccinated person with asplenia or sickle cell disease or having a scheduled splenectomy should receive 1 dose of Hib vaccine. Regardless of previous immunization, a recipient of a hematopoietic stem cell transplant should receive a 3-dose series 6-12 months after his successful transplant. Hib vaccine is not recommended for adults with HIV infection. Preventive Service / Frequency Ages 52 to 17  Blood pressure check.** / Every 1 to 2 years.  Lipid and cholesterol check.** / Every 5 years beginning at age 69.  Hepatitis C blood test.** / For any individual with known risks for  hepatitis C.  Skin self-exam. / Monthly.  Influenza vaccine. / Every year.  Tetanus, diphtheria, and acellular pertussis (Tdap, Td) vaccine.** / Consult your health care provider. 1 dose of Td every 10 years.  Varicella vaccine.** / Consult your health care provider.  HPV vaccine. / 3 doses over 6 months, if 72 or younger.  Measles, mumps, rubella (MMR) vaccine.** / You need at least 1 dose of MMR if you were born in 1957 or later. You may also need a second dose.  Pneumococcal 13-valent conjugate (PCV13) vaccine.** / Consult your health care provider.  Pneumococcal polysaccharide (PPSV23) vaccine.** / 1 to 2 doses if you smoke cigarettes or if you have certain conditions.  Meningococcal vaccine.** / 1 dose if you are age 35 to 60 years and a Market researcher living in a residence hall, or have one of several medical conditions. You may also need additional booster doses.  Hepatitis A vaccine.** / Consult your health care provider.  Hepatitis B vaccine.** / Consult your health care provider.  Haemophilus influenzae type b (Hib) vaccine.** / Consult your health care provider. Ages 35 to 8  Blood pressure check.** / Every 1 to 2 years.  Lipid and cholesterol check.** / Every 5 years beginning at age 57.  Lung cancer screening. / Every year if you are aged 44-80 years and have a 30-pack-year history of smoking and currently smoke or have quit within the past 15 years. Yearly screening is stopped once you have quit smoking for at least 15 years or develop a health problem that would prevent you from having lung cancer treatment.  Fecal occult blood test (FOBT) of stool. / Every year beginning at age 55 and continuing until age 73. You may not have to do this test if you get a colonoscopy every 10 years.  Flexible sigmoidoscopy** or colonoscopy.** / Every 5 years for a flexible sigmoidoscopy or every 10 years for a colonoscopy beginning at age 28 and continuing until age  1.  Hepatitis C blood test.** / For all people born from [birth year] through 1965 and any individual with known risks for hepatitis C.  Skin self-exam. / Monthly.  Influenza vaccine. / Every  year.  Tetanus, diphtheria, and acellular pertussis (Tdap/Td) vaccine.** / Consult your health care provider. 1 dose of Td every 10 years.  Varicella vaccine.** / Consult your health care provider.  Zoster vaccine.** / 1 dose for adults aged 53 years or older.  Measles, mumps, rubella (MMR) vaccine.** / You need at least 1 dose of MMR if you were born in 1957 or later. You may also need a second dose.  Pneumococcal 13-valent conjugate (PCV13) vaccine.** / Consult your health care provider.  Pneumococcal polysaccharide (PPSV23) vaccine.** / 1 to 2 doses if you smoke cigarettes or if you have certain conditions.  Meningococcal vaccine.** / Consult your health care provider.  Hepatitis A vaccine.** / Consult your health care provider.  Hepatitis B vaccine.** / Consult your health care provider.  Haemophilus influenzae type b (Hib) vaccine.** / Consult your health care provider. Ages 77 and over  Blood pressure check.** / Every 1 to 2 years.  Lipid and cholesterol check.**/ Every 5 years beginning at age 85.  Lung cancer screening. / Every year if you are aged 55-80 years and have a 30-pack-year history of smoking and currently smoke or have quit within the past 15 years. Yearly screening is stopped once you have quit smoking for at least 15 years or develop a health problem that would prevent you from having lung cancer treatment.  Fecal occult blood test (FOBT) of stool. / Every year beginning at age 33 and continuing until age 11. You may not have to do this test if you get a colonoscopy every 10 years.  Flexible sigmoidoscopy** or colonoscopy.** / Every 5 years for a flexible sigmoidoscopy or every 10 years for a colonoscopy beginning at age 28 and continuing until age 73.  Hepatitis C blood  test.** / For all people born from [birth year] through 1965 and any individual with known risks for hepatitis C.  Abdominal aortic aneurysm (AAA) screening.** / A one-time screening for ages 50 to 27 years who are current or former smokers.  Skin self-exam. / Monthly.  Influenza vaccine. / Every year.  Tetanus, diphtheria, and acellular pertussis (Tdap/Td) vaccine.** / 1 dose of Td every 10 years.  Varicella vaccine.** / Consult your health care provider.  Zoster vaccine.** / 1 dose for adults aged 34 years or older.  Pneumococcal 13-valent conjugate (PCV13) vaccine.** / Consult your health care provider.  Pneumococcal polysaccharide (PPSV23) vaccine.** / 1 dose for all adults aged 63 years and older.  Meningococcal vaccine.** / Consult your health care provider.  Hepatitis A vaccine.** / Consult your health care provider.  Hepatitis B vaccine.** / Consult your health care provider.  Haemophilus influenzae type b (Hib) vaccine.** / Consult your health care provider. **Family history and personal history of risk and conditions may change your health care provider's recommendations. Document Released: 03/13/2001 Document Revised: 01/20/2013 Document Reviewed: 06/12/2010 [facility] Patient Information 2015 [place], [place]. This information is not intended to replace advice given to you by your health care provider. Make sure you discuss any questions you have with your health care provider.

## 2014-08-19 NOTE — Progress Notes (Signed)
Pre visit review using our clinic review tool, if applicable. No additional management support is needed unless otherwise documented below in the visit note. 

## 2014-08-26 ENCOUNTER — Telehealth: Payer: Self-pay | Admitting: Family Medicine

## 2014-08-26 DIAGNOSIS — R972 Elevated prostate specific antigen [PSA]: Secondary | ICD-10-CM

## 2014-08-26 MED ORDER — PRAMIPEXOLE DIHYDROCHLORIDE 0.125 MG PO TABS: ORAL_TABLET | ORAL | Status: DC

## 2014-08-26 NOTE — Telephone Encounter (Signed)
His labs were normal other than the elevated PSA. Please advise what he should do in reference to the leg pain.       KP

## 2014-08-26 NOTE — Telephone Encounter (Signed)
He is having Bilateral leg pain, he is having difficulty walking when he exercising. He said he is having tingling and if he stands to long feels like he will fall. He said he has decrease energy but he has agreed to try the Mirapex and will call back in 2 weeks if the medication does not work.      KP

## 2014-08-26 NOTE — Telephone Encounter (Signed)
Tell him to stop the lipitor and see if it improves--  It may take 2 weeks

## 2014-08-26 NOTE — Telephone Encounter (Signed)
Reason for call: Pt would like to discuss recent labs and if anything is low or elevated that could be causing the leg pain he is experiencing. Please call.

## 2014-08-26 NOTE — Telephone Encounter (Signed)
If i remember correctly he complained of RLS----  If correct--- try mirapex 0.125 1 po qhs x 7 days and can increase to 2 qpm if needed ---- #60  Call if no better

## 2014-08-26 NOTE — Telephone Encounter (Signed)
Spoke with patient and he will stop the Lipitor.      KP

## 2014-09-13 ENCOUNTER — Other Ambulatory Visit: Payer: Self-pay | Admitting: Family Medicine

## 2014-09-29 ENCOUNTER — Telehealth: Payer: Self-pay | Admitting: Family Medicine

## 2014-09-29 NOTE — Telephone Encounter (Signed)
Caller name: Bernestine Amass Relationship to patient: Dr. Norm Salt office  Can be reached: [phone]  Pharmacy:  Reason for call: She is requesting the pt most recent blood work. Awaiting release form fax to [place] at : [phone] in order to release those forms.  She says that pt comes in to there office periodically to get a check up.    Please fax to: [phone] .

## 2014-11-12 ENCOUNTER — Telehealth: Payer: Self-pay | Admitting: Family Medicine

## 2014-11-12 NOTE — Telephone Encounter (Signed)
Caller name: Clarise Cruz   Relationship to patient: [place]  Can be reached: [phone]   Pharmacy:  Reason for call: She says the pt is requesting a new script. Pt wants a Production assistant, radio meter  with Test Strips. Pt also need the true plus gauge lancets.   Please advise.

## 2014-11-15 NOTE — Addendum Note (Signed)
Addended by: Ewing Schlein on: 11/15/2014 04:05 PM   Modules accepted: Orders

## 2014-11-16 MED ORDER — TRUE METRIX AIR GLUCOSE METER W/DEVICE KIT: 1.0000 | PACK | Freq: Every day | Status: DC

## 2014-11-16 MED ORDER — TRUE METRIX BLOOD GLUCOSE TEST VI STRP: ORAL_STRIP | Status: DC

## 2014-11-16 MED ORDER — TRUEPLUS LANCETS 33G MISC: Status: DC

## 2014-11-16 NOTE — Addendum Note (Signed)
Addended by: Ewing Schlein on: 11/16/2014 09:06 AM   Modules accepted: Orders

## 2014-11-16 NOTE — Telephone Encounter (Signed)
Rx faxed.    KP 

## 2014-11-26 ENCOUNTER — Telehealth: Payer: Self-pay | Admitting: Family Medicine

## 2014-11-26 NOTE — Telephone Encounter (Signed)
Relation to UX:YBFX Call back [phone]   Reason for call:  Patient requesting a referral to [facility] [phone]

## 2014-11-26 NOTE — Telephone Encounter (Signed)
Did he tell you why?     KP

## 2014-11-26 NOTE — Telephone Encounter (Signed)
The referral was done in 07/2014. It should be good for 6 months. All he has to do is call for the apt.      KP

## 2014-11-26 NOTE — Telephone Encounter (Signed)
Patient is being seen at [facility] for PSA lab work

## 2014-11-26 NOTE — Telephone Encounter (Signed)
Pt called in, he states that he is returning your call.    CB: [phone]

## 2014-12-01 ENCOUNTER — Telehealth: Payer: Self-pay

## 2014-12-01 DIAGNOSIS — Z1211 Encounter for screening for malignant neoplasm of colon: Secondary | ICD-10-CM

## 2014-12-01 NOTE — Telephone Encounter (Signed)
Patient called wanting to know since he is 74 years old, does he need to have a colonoscopy

## 2014-12-01 NOTE — Telephone Encounter (Signed)
LVM informing pt of the below.

## 2014-12-02 NOTE — Telephone Encounter (Signed)
Please advise      KP 

## 2014-12-02 NOTE — Telephone Encounter (Signed)
Recommended stopping age is 51

## 2014-12-03 NOTE — Telephone Encounter (Signed)
The patient is recommend a screening colonoscopy. Please advise     KP

## 2014-12-04 NOTE — Telephone Encounter (Signed)
Refer for colon  

## 2014-12-06 NOTE — Addendum Note (Signed)
Addended by: Ewing Schlein on: 12/06/2014 09:03 AM   Modules accepted: Orders

## 2014-12-06 NOTE — Telephone Encounter (Signed)
Ref has been placed.     KP 

## 2014-12-15 ENCOUNTER — Telehealth: Payer: Self-pay | Admitting: Internal Medicine

## 2014-12-15 NOTE — Telephone Encounter (Signed)
Received records from [place]. Patient is not requesting a specific doctor and only reason why he is transferring to [place] is because Dr. Etter Sjogren had referred him to our office. Records placed on Dr. Celesta Aver desk for review.

## 2014-12-20 NOTE — Telephone Encounter (Signed)
Records Reviewed and Dr. Carlean Purl accepted Patient Called Pt to sch'd and he wanted to sch'd for December, No appts avail until January.  Pt declined

## 2015-01-03 NOTE — Telephone Encounter (Signed)
Records are being placed in "records reviewed" folder.

## 2015-01-13 ENCOUNTER — Telehealth: Payer: Self-pay | Admitting: Family Medicine

## 2015-01-13 MED ORDER — METFORMIN HCL ER 750 MG PO TB24: 750.0000 mg | ORAL_TABLET | Freq: Two times a day (BID) | ORAL | Status: DC

## 2015-01-13 MED ORDER — CARVEDILOL 6.25 MG PO TABS: ORAL_TABLET | ORAL | Status: DC

## 2015-01-13 MED ORDER — ATORVASTATIN CALCIUM 10 MG PO TABS: 10.0000 mg | ORAL_TABLET | Freq: Every day | ORAL | Status: DC

## 2015-01-13 NOTE — Telephone Encounter (Signed)
Caller name: Self   Can be reached: [place]:  [address] [place], [place] [phone] (Phone) [phone] (Fax)         Reason for call: Request refill for atorvastatin (LIPITOR) 10 MG tablet [id number] , carvedilol (COREG) 6.25 MG tablet [id number] and metFORMIN (GLUCOPHAGE-XR) 750 MG 24 hr tablet [id number]

## 2015-01-13 NOTE — Telephone Encounter (Signed)
Rx faxed.    KP 

## 2015-01-17 ENCOUNTER — Telehealth: Payer: Self-pay | Admitting: Family Medicine

## 2015-01-17 NOTE — Telephone Encounter (Signed)
Caller name: Chay[place]   Relationship to patient: Self   Can be reached: [phone]  Reason for call: Pt is requesting a call back from [place] directly.

## 2015-01-17 NOTE — Telephone Encounter (Signed)
Spoke with patient and he wanted to know if we received the office notes from 12/30/14 from [facility]. After reviewing Dr.Lowne incoming charts, I made him aware that we did not have it at this time. I gave him our fax number to have them fax it again.    KP

## 2015-02-22 ENCOUNTER — Ambulatory Visit: Payer: Commercial Managed Care - HMO | Admitting: Family Medicine

## 2015-02-28 ENCOUNTER — Encounter: Payer: Self-pay | Admitting: Family Medicine

## 2015-02-28 ENCOUNTER — Ambulatory Visit (INDEPENDENT_AMBULATORY_CARE_PROVIDER_SITE_OTHER): Payer: Commercial Managed Care - HMO | Admitting: Family Medicine

## 2015-02-28 VITALS — BP 146/92 | HR 64 | Temp 98.2°F | Wt 151.0 lb

## 2015-02-28 DIAGNOSIS — Z23 Encounter for immunization: Secondary | ICD-10-CM | POA: Diagnosis not present

## 2015-02-28 DIAGNOSIS — E785 Hyperlipidemia, unspecified: Secondary | ICD-10-CM | POA: Diagnosis not present

## 2015-02-28 DIAGNOSIS — IMO0001 Reserved for inherently not codable concepts without codable children: Secondary | ICD-10-CM

## 2015-02-28 DIAGNOSIS — E1165 Type 2 diabetes mellitus with hyperglycemia: Secondary | ICD-10-CM | POA: Diagnosis not present

## 2015-02-28 DIAGNOSIS — E1169 Type 2 diabetes mellitus with other specified complication: Secondary | ICD-10-CM

## 2015-02-28 DIAGNOSIS — I10 Essential (primary) hypertension: Secondary | ICD-10-CM

## 2015-02-28 DIAGNOSIS — Z0000 Encounter for general adult medical examination without abnormal findings: Secondary | ICD-10-CM

## 2015-02-28 LAB — LIPID PANEL
CHOL/HDL RATIO: 3
CHOLESTEROL: 109 mg/dL (ref 0–200)
HDL: 34.4 mg/dL — ABNORMAL LOW (ref >39.00)
LDL Cholesterol: 54 mg/dL (ref 0–99)
NonHDL: 74.87
TRIGLYCERIDES: 104 mg/dL (ref 0.0–149.0)
VLDL: 20.8 mg/dL (ref 0.0–40.0)

## 2015-02-28 LAB — POCT URINALYSIS DIPSTICK
BILIRUBIN UA: NEGATIVE
Glucose, UA: NEGATIVE
Ketones, UA: NEGATIVE
LEUKOCYTES UA: NEGATIVE
NITRITE UA: NEGATIVE
PH UA: 6
PROTEIN UA: NEGATIVE
RBC UA: NEGATIVE
Spec Grav, UA: 1.015
UROBILINOGEN UA: 0.2

## 2015-02-28 LAB — COMPREHENSIVE METABOLIC PANEL
ALBUMIN: 4.3 g/dL (ref 3.5–5.2)
ALK PHOS: 43 U/L (ref 39–117)
ALT: 18 U/L (ref 0–53)
AST: 19 U/L (ref 0–37)
BILIRUBIN TOTAL: 0.6 mg/dL (ref 0.2–1.2)
BUN: 9 mg/dL (ref 6–23)
CALCIUM: 9.5 mg/dL (ref 8.4–10.5)
CO2: 29 mEq/L (ref 19–32)
Chloride: 100 mEq/L (ref 96–112)
Creatinine, Ser: 0.71 mg/dL (ref 0.40–1.50)
GFR: 115.65 mL/min (ref >60.00)
Glucose, Bld: 132 mg/dL — ABNORMAL HIGH (ref 70–99)
Potassium: 4.7 mEq/L (ref 3.5–5.1)
Sodium: 137 mEq/L (ref 135–145)
TOTAL PROTEIN: 7.4 g/dL (ref 6.0–8.3)

## 2015-02-28 LAB — MICROALBUMIN / CREATININE URINE RATIO
CREATININE, U: 55.1 mg/dL
MICROALB/CREAT RATIO: 2.4 mg/g (ref 0.0–30.0)
Microalb, Ur: 1.3 mg/dL (ref 0.0–1.9)

## 2015-02-28 LAB — HEMOGLOBIN A1C: HEMOGLOBIN A1C: 6.7 % — AB (ref 4.6–6.5)

## 2015-02-28 MED ORDER — ATORVASTATIN CALCIUM 10 MG PO TABS: 10.0000 mg | ORAL_TABLET | Freq: Every day | ORAL | Status: DC

## 2015-02-28 MED ORDER — METFORMIN HCL ER 750 MG PO TB24: 750.0000 mg | ORAL_TABLET | Freq: Two times a day (BID) | ORAL | Status: DC

## 2015-02-28 MED ORDER — CARVEDILOL 6.25 MG PO TABS: ORAL_TABLET | ORAL | Status: DC

## 2015-02-28 NOTE — Progress Notes (Signed)
Pre visit review using our clinic review tool, if applicable. No additional management support is needed unless otherwise documented below in the visit note. 

## 2015-02-28 NOTE — Patient Instructions (Signed)

## 2015-02-28 NOTE — Assessment & Plan Note (Signed)
con't metformin Check labs 

## 2015-02-28 NOTE — Progress Notes (Signed)
Patient ID: Eddie Mcdaniel, male    DOB: 1940-10-19  Age: 75 y.o. MRN: [id number]    Subjective:  Subjective HPI ZYREN SEVIGNY presents for f/u dm , htn, cholesterol  HYPERTENSION  Blood pressure range-not checking   Chest pain- no      Dyspnea- no Lightheadedness- no   Edema- no Other side effects - no   Medication compliance: good Low salt diet- yes  DIABETES  Blood Sugar ranges-105-130  Polyuria- no New Visual problems- no Hypoglycemic symptoms- no Other side effects-no Medication compliance - good Last eye exam- 07/2014 Foot exam- today  HYPERLIPIDEMIA  Medication compliance- good RUQ pain- no  Muscle aches- no Other side effects-no  Review of Systems  Constitutional: Negative for diaphoresis, appetite change, fatigue and unexpected weight change.  Eyes: Negative for pain, redness and visual disturbance.  Respiratory: Negative for cough, chest tightness, shortness of breath and wheezing.   Cardiovascular: Negative for chest pain, palpitations and leg swelling.  Endocrine: Negative for cold intolerance, heat intolerance, polydipsia, polyphagia and polyuria.  Genitourinary: Negative for dysuria, frequency and difficulty urinating.  Neurological: Negative for dizziness, light-headedness, numbness and headaches.    History Past Medical History  Diagnosis Date  . GERD (gastroesophageal reflux disease)   . Hyperlipidemia   . Hypertension   . Diabetes mellitus     Type 2    He has past surgical history that includes Shoulder arthroscopy w/ rotator cuff repair and Eye surgery (2005).   His family history includes Cancer (age of onset: 41) in his brother; Diabetes in his brother and mother; Heart disease in his brother and brother; Hyperlipidemia in his brother; Hypertension in his brother and mother; Stroke in his brother.He reports that he has never smoked. He quit smokeless tobacco use about 4 years ago. His smokeless tobacco use included Chew. He reports  that he does not drink alcohol or use illicit drugs.  Current Outpatient Prescriptions on File Prior to Visit  Medication Sig Dispense Refill  . aspirin 81 MG chewable tablet Chew 81 mg by mouth daily.      . Blood Glucose Monitoring Suppl (TRUE METRIX AIR GLUCOSE METER) W/DEVICE KIT 1 Device by Does not apply route daily. Check blood sugar twice daily. Dx. E11.9 1 kit 0  . CINNAMON PO Take by mouth.    . ONE TOUCH ULTRA TEST test strip Check blood sugar once a day. Dx 250.00 300 each 4  . ONETOUCH DELICA LANCETS 33G MISC 1 Device by Does not apply route daily. DX 250.00 300 each 4  . TRUE METRIX BLOOD GLUCOSE TEST test strip Check blood sugar twice daily. Dx. E11.9 100 each 12  . TRUEPLUS LANCETS 33G MISC Check blood sugar twice daily. Dx. E11.9 100 each 12  . Turmeric 450 MG CAPS Take 1 tablet by mouth daily.     No current facility-administered medications on file prior to visit.     Objective:  Objective Physical Exam  Constitutional: He is oriented to person, place, and time. Vital signs are normal. He appears well-developed and well-nourished. He is sleeping.  HENT:  Head: Normocephalic and atraumatic.  Mouth/Throat: Oropharynx is clear and moist.  Eyes: EOM are normal. Pupils are equal, round, and reactive to light.  Neck: Normal range of motion. Neck supple. No thyromegaly present.  Cardiovascular: Normal rate and regular rhythm.   No murmur heard. Pulmonary/Chest: Effort normal and breath sounds normal. No respiratory distress. He has no wheezes. He has no rales. He exhibits no tenderness.  Musculoskeletal: He exhibits no edema or tenderness.  Neurological: He is alert and oriented to person, place, and time.  Skin: Skin is warm and dry.  Psychiatric: He has a normal mood and affect. His behavior is normal. Judgment and thought content normal.  Nursing note and vitals reviewed. Sensory exam of the foot is normal, tested with the monofilament. Good pulses, no lesions or  ulcers, good peripheral pulses.  BP 146/92 mmHg  Pulse 64  Temp(Src) 98.2 F (36.8 C) (Oral)  Wt 151 lb (68.493 kg)  SpO2 98% Wt Readings from Last 3 Encounters:  02/28/15 151 lb (68.493 kg)  08/19/14 150 lb 3.2 oz (68.13 kg)  01/25/14 153 lb (69.4 kg)     Lab Results  Component Value Date   WBC 8.4 08/19/2014   HGB 13.4 08/19/2014   HCT 39.9 08/19/2014   PLT 166.0 08/19/2014   GLUCOSE 132* 02/28/2015   CHOL 109 02/28/2015   TRIG 104.0 02/28/2015   HDL 34.40* 02/28/2015   LDLCALC 54 02/28/2015   ALT 18 02/28/2015   AST 19 02/28/2015   NA 137 02/28/2015   K 4.7 02/28/2015   CL 100 02/28/2015   CREATININE 0.71 02/28/2015   BUN 9 02/28/2015   CO2 29 02/28/2015   PSA 7.26* 08/19/2014   HGBA1C 6.7* 02/28/2015   MICROALBUR 1.3 02/28/2015    US Renal  09/23/2013  CLINICAL DATA:  Proteinuria, chronic kidney disease stage 1. EXAM: RENAL/URINARY TRACT ULTRASOUND COMPLETE COMPARISON:  None. FINDINGS: Right Kidney: Length: 10.8 cm. Echogenicity within normal limits. No mass or hydronephrosis visualized. Left Kidney: Length: 11 cm. Echogenicity within normal limits. No mass or hydronephrosis visualized. Bladder: Appears normal for degree of bladder distention. Bilateral ureteral jets are noted. Prostate gland is prominent measuring 4.5 x 3.7 x 5.9 cm. IMPRESSION: Normal kidneys. Electronically Signed   By: Abelardo Diesel M.D.   On: 09/23/2013 14:23     Assessment & Plan:  Plan I have discontinued Mr. Marti pramipexole. I am also having him maintain his aspirin, ONE TOUCH ULTRA TEST, ONETOUCH DELICA LANCETS 63J, Turmeric, CINNAMON PO, TRUE METRIX BLOOD GLUCOSE TEST, TRUEPLUS LANCETS 33G, TRUE METRIX AIR GLUCOSE METER, atorvastatin, carvedilol, and metFORMIN.  Meds ordered this encounter  Medications  . atorvastatin (LIPITOR) 10 MG tablet    Sig: Take 1 tablet (10 mg total) by mouth daily.    Dispense:  90 tablet    Refill:  1  . carvedilol (COREG) 6.25 MG tablet    Sig: TAKE  1 TABLET TWICE A DAY WITH A MEAL    Dispense:  180 tablet    Refill:  1  . metFORMIN (GLUCOPHAGE-XR) 750 MG 24 hr tablet    Sig: Take 1 tablet (750 mg total) by mouth 2 (two) times daily.    Dispense:  180 tablet    Refill:  1    Problem List Items Addressed This Visit    None    Visit Diagnoses    Preventative health care    -  Primary    Relevant Orders    Ambulatory referral to Gastroenterology    Need for immunization against influenza        Relevant Orders    Flu vaccine HIGH DOSE PF (Fluzone High dose) (Completed)    Uncontrolled type 2 diabetes mellitus without complication, without long-term current use of insulin (HCC)        Relevant Medications    atorvastatin (LIPITOR) 10 MG tablet    metFORMIN (GLUCOPHAGE-XR) 750 MG  24 hr tablet    Other Relevant Orders    Comp Met (CMET) (Completed)    Hemoglobin A1c (Completed)    Microalbumin / creatinine urine ratio (Completed)    POCT urinalysis dipstick (Completed)    Hyperlipidemia LDL goal <70        Relevant Medications    atorvastatin (LIPITOR) 10 MG tablet    carvedilol (COREG) 6.25 MG tablet    Other Relevant Orders    Lipid panel (Completed)    Microalbumin / creatinine urine ratio (Completed)    POCT urinalysis dipstick (Completed)    Essential hypertension        Relevant Medications    atorvastatin (LIPITOR) 10 MG tablet    carvedilol (COREG) 6.25 MG tablet       Follow-up: Return in about 6 months (around 08/28/2015), or if symptoms worsen or fail to improve, for htn, hyperlipidemia, DM.  Garnet Koyanagi, DO

## 2015-02-28 NOTE — Assessment & Plan Note (Signed)
Slightly elevated  Current outpatient prescriptions:  .  aspirin 81 MG chewable tablet, Chew 81 mg by mouth daily.  , Disp: , Rfl:  .  atorvastatin (LIPITOR) 10 MG tablet, Take 1 tablet (10 mg total) by mouth daily., Disp: 90 tablet, Rfl: 1 .  Blood Glucose Monitoring Suppl (TRUE METRIX AIR GLUCOSE METER) W/DEVICE KIT, 1 Device by Does not apply route daily. Check blood sugar twice daily. Dx. E11.9, Disp: 1 kit, Rfl: 0 .  carvedilol (COREG) 6.25 MG tablet, TAKE 1 TABLET TWICE A DAY WITH A MEAL, Disp: 180 tablet, Rfl: 1 .  CINNAMON PO, Take by mouth., Disp: , Rfl:  .  metFORMIN (GLUCOPHAGE-XR) 750 MG 24 hr tablet, Take 1 tablet (750 mg total) by mouth 2 (two) times daily., Disp: 180 tablet, Rfl: 1 .  ONE TOUCH ULTRA TEST test strip, Check blood sugar once a day. Dx 250.00, Disp: 300 each, Rfl: 4 .  ONETOUCH DELICA LANCETS 66M MISC, 1 Device by Does not apply route daily. DX 250.00, Disp: 300 each, Rfl: 4 .  TRUE METRIX BLOOD GLUCOSE TEST test strip, Check blood sugar twice daily. Dx. E11.9, Disp: 100 each, Rfl: 12 .  TRUEPLUS LANCETS 33G MISC, Check blood sugar twice daily. Dx. E11.9, Disp: 100 each, Rfl: 12 .  Turmeric 450 MG CAPS, Take 1 tablet by mouth daily., Disp: , Rfl:

## 2015-03-22 ENCOUNTER — Ambulatory Visit (INDEPENDENT_AMBULATORY_CARE_PROVIDER_SITE_OTHER): Payer: Commercial Managed Care - HMO | Admitting: Family Medicine

## 2015-03-22 ENCOUNTER — Ambulatory Visit (HOSPITAL_BASED_OUTPATIENT_CLINIC_OR_DEPARTMENT_OTHER)
Admission: RE | Admit: 2015-03-22 | Discharge: 2015-03-22 | Disposition: A | Discharge status: Home / Self Care | Payer: Commercial Managed Care - HMO | Source: Ambulatory Visit | Attending: Family Medicine | Admitting: Family Medicine

## 2015-03-22 ENCOUNTER — Encounter: Payer: Self-pay | Admitting: Family Medicine

## 2015-03-22 VITALS — BP 144/92 | HR 79 | Temp 98.3°F | Wt 155.0 lb

## 2015-03-22 DIAGNOSIS — J209 Acute bronchitis, unspecified: Secondary | ICD-10-CM

## 2015-03-22 DIAGNOSIS — R52 Pain, unspecified: Secondary | ICD-10-CM

## 2015-03-22 LAB — POCT INFLUENZA A/B
Influenza A, POC: NEGATIVE
Influenza B, POC: NEGATIVE

## 2015-03-22 MED ORDER — AZITHROMYCIN 250 MG PO TABS: ORAL_TABLET | ORAL | Status: DC

## 2015-03-22 MED FILL — AZITHROMYCIN 250 MG TABLET: 250 | 5 days supply | Qty: 6 | Fill #0

## 2015-03-22 NOTE — Progress Notes (Signed)
Subjective:     Eddie Mcdaniel is a 75 y.o. male who presents for evaluation of symptoms of a URI. Symptoms include achiness, congestion, cough described as productive, low grade fever, nasal congestion, post nasal drip and productive cough with  green colored sputum. Onset of symptoms was 10 days ago, and has been gradually worsening since that time. Treatment to date: antihistamines and cough suppressants.  The following portions of the patient's history were reviewed and updated as appropriate:  He  has a past medical history of GERD (gastroesophageal reflux disease); Hyperlipidemia; Hypertension; and Diabetes mellitus. He  does not have any pertinent problems on file. He  has past surgical history that includes Shoulder arthroscopy w/ rotator cuff repair and Eye surgery (2005). His family history includes Cancer (age of onset: 75) in his brother; Diabetes in his brother and mother; Heart disease in his brother and brother; Hyperlipidemia in his brother; Hypertension in his brother and mother; Stroke in his brother. He  reports that he has never smoked. He quit smokeless tobacco use about 4 years ago. His smokeless tobacco use included Chew. He reports that he does not drink alcohol or use illicit drugs. He has a current medication list which includes the following prescription(s): aspirin, atorvastatin, true metrix air glucose meter, carvedilol, cinnamon, metformin, one touch ultra test, onetouch delica lancets 44W, pseudoephedrine-acetaminophen, true metrix blood glucose test, trueplus lancets 33g, turmeric, and azithromycin. Current Outpatient Prescriptions on File Prior to Visit  Medication Sig Dispense Refill  . aspirin 81 MG chewable tablet Chew 81 mg by mouth daily.      Marland Kitchen atorvastatin (LIPITOR) 10 MG tablet Take 1 tablet (10 mg total) by mouth daily. 90 tablet 1  . Blood Glucose Monitoring Suppl (TRUE METRIX AIR GLUCOSE METER) W/DEVICE KIT 1 Device by Does not apply route daily. Check  blood sugar twice daily. Dx. E11.9 1 kit 0  . carvedilol (COREG) 6.25 MG tablet TAKE 1 TABLET TWICE A DAY WITH A MEAL 180 tablet 1  . CINNAMON PO Take by mouth.    . metFORMIN (GLUCOPHAGE-XR) 750 MG 24 hr tablet Take 1 tablet (750 mg total) by mouth 2 (two) times daily. 180 tablet 1  . ONE TOUCH ULTRA TEST test strip Check blood sugar once a day. Dx 250.00 300 each 4  . ONETOUCH DELICA LANCETS 10U MISC 1 Device by Does not apply route daily. DX 250.00 300 each 4  . TRUE METRIX BLOOD GLUCOSE TEST test strip Check blood sugar twice daily. Dx. E11.9 100 each 12  . TRUEPLUS LANCETS 33G MISC Check blood sugar twice daily. Dx. E11.9 100 each 12  . Turmeric 450 MG CAPS Take 1 tablet by mouth daily.     No current facility-administered medications on file prior to visit.   He is allergic to ace inhibitors..  Review of Systems Pertinent items are noted in HPI.   Objective:    BP 144/92 mmHg  Pulse 79  Temp(Src) 98.3 F (36.8 C) (Oral)  Wt 155 lb (70.308 kg)  SpO2 98% General appearance: alert, cooperative, appears stated age and no distress Head: Normocephalic, without obvious abnormality, atraumatic Eyes: conjunctivae/corneas clear. PERRL, EOM's intact. Fundi benign. Ears: normal TM's and external ear canals both ears Nose: Nares normal. Septum midline. Mucosa normal. No drainage or sinus tenderness., green discharge, moderate congestion, turbinates red, swollen, sinus tenderness bilateral Throat: lips, mucosa, and tongue normal; teeth and gums normal Neck: mild anterior cervical adenopathy, supple, symmetrical, trachea midline and thyroid not enlarged, symmetric,  no tenderness/mass/nodules Lungs: diminished breath sounds bilaterally and rhonchi bilaterally  Heart: regular rate and rhythm, S1, S2 normal, no murmur, click, rub or gallop Extremities: extremities normal, atraumatic, no cyanosis or edema Lymph nodes: Cervical, supraclavicular, and axillary nodes normal.   Flu test  neg Assessment:    bronchitis   Plan:    Suggested symptomatic OTC remedies. Nasal saline spray for congestion. Zithromax per orders. Follow up as needed.

## 2015-03-22 NOTE — Patient Instructions (Signed)

## 2015-03-22 NOTE — Progress Notes (Signed)
Pre visit review using our clinic review tool, if applicable. No additional management support is needed unless otherwise documented below in the visit note. 

## 2015-04-25 ENCOUNTER — Telehealth: Payer: Self-pay | Admitting: Family Medicine

## 2015-04-25 NOTE — Telephone Encounter (Signed)
Auto call and pt called back. Declined AWV with RN.

## 2015-07-13 ENCOUNTER — Telehealth: Payer: Self-pay | Admitting: Family Medicine

## 2015-07-13 DIAGNOSIS — E119 Type 2 diabetes mellitus without complications: Secondary | ICD-10-CM

## 2015-07-13 NOTE — Telephone Encounter (Signed)
Pt called in to inform provider that he need 2 referrals.   He says that he has an appt with Dr. Kennith Maes on 7/12 at 2:30 - eye dr. - phone: [phone].   And a second referral to Colonoscopy at [facility] at [place] - Phone: [phone]

## 2015-07-14 NOTE — Telephone Encounter (Signed)
Will need referral placed for appointment with Dr Pauline Aus.

## 2015-07-14 NOTE — Telephone Encounter (Signed)
Delsa Sale-- pt needs referral for [employer]. Pt was referred to [facility] but prefers to continue with [facility]. I don't think pt ever schedule appt with [facility]. Can you fax referral and obtain [employer] auth.  Also need [facility] Staggers for upcoming eye appt. See below note. Thanks!

## 2015-07-14 NOTE — Telephone Encounter (Signed)
Referral has been faxed to [facility], will do [facility] referral once aware of appt

## 2015-07-14 NOTE — Telephone Encounter (Signed)
Ophthalmology referral place. Thanks!

## 2015-07-22 NOTE — Telephone Encounter (Signed)
error 

## 2015-08-04 ENCOUNTER — Encounter: Payer: Self-pay | Admitting: Internal Medicine

## 2015-09-16 ENCOUNTER — Ambulatory Visit (AMBULATORY_SURGERY_CENTER): Payer: Self-pay | Admitting: *Deleted

## 2015-09-16 ENCOUNTER — Encounter: Payer: Self-pay | Admitting: Internal Medicine

## 2015-09-16 VITALS — Ht 65.0 in | Wt 151.6 lb

## 2015-09-16 DIAGNOSIS — Z1211 Encounter for screening for malignant neoplasm of colon: Secondary | ICD-10-CM

## 2015-09-16 NOTE — Progress Notes (Signed)
Denies allergies to eggs or soy products. Denies complications with sedation or anesthesia. Denies O2 use. Denies use of diet or weight loss medications.  Emmi instructions given for colonoscopy.  

## 2015-09-30 ENCOUNTER — Ambulatory Visit (AMBULATORY_SURGERY_CENTER): Payer: Commercial Managed Care - HMO | Admitting: Internal Medicine

## 2015-09-30 ENCOUNTER — Encounter: Payer: Self-pay | Admitting: Internal Medicine

## 2015-09-30 VITALS — BP 165/79 | HR 54 | Temp 98.0°F | Resp 11 | Ht 65.0 in | Wt 151.0 lb

## 2015-09-30 DIAGNOSIS — Z1211 Encounter for screening for malignant neoplasm of colon: Secondary | ICD-10-CM | POA: Diagnosis present

## 2015-09-30 DIAGNOSIS — D125 Benign neoplasm of sigmoid colon: Secondary | ICD-10-CM

## 2015-09-30 LAB — GLUCOSE, CAPILLARY
GLUCOSE-CAPILLARY: 86 mg/dL (ref 65–99)
Glucose-Capillary: 97 mg/dL (ref 65–99)

## 2015-09-30 MED ORDER — DEXTROSE 5 % IV SOLN: INTRAVENOUS | Status: DC

## 2015-09-30 MED ORDER — SODIUM CHLORIDE 0.9 % IV SOLN
500.0000 mL | INTRAVENOUS | Status: DC
Start: 2015-09-30 — End: 2015-11-14

## 2015-09-30 NOTE — Progress Notes (Signed)
To recovery awake alert VSS report to [employer]

## 2015-09-30 NOTE — Patient Instructions (Addendum)
   I found and removed 2 polyps - both look benign. I will let you know pathology results and when to have another routine colonoscopy by mail.  I appreciate the opportunity to care for you. Gatha Mayer, MD, FACG  YOU HAD AN ENDOSCOPIC PROCEDURE TODAY AT [facility]:   Refer to the procedure report that was given to you for any specific questions about what was found during the examination.  If the procedure report does not answer your questions, please call your gastroenterologist to clarify.  If you requested that your care partner not be given the details of your procedure findings, then the procedure report has been included in a sealed envelope for you to review at your convenience later.  YOU SHOULD EXPECT: Some feelings of bloating in the abdomen. Passage of more gas than usual.  Walking can help get rid of the air that was put into your GI tract during the procedure and reduce the bloating. If you had a lower endoscopy (such as a colonoscopy or flexible sigmoidoscopy) you may notice spotting of blood in your stool or on the toilet paper. If you underwent a bowel prep for your procedure, you may not have a normal bowel movement for a few days.  Please Note:  You might notice some irritation and congestion in your nose or some drainage.  This is from the oxygen used during your procedure.  There is no need for concern and it should clear up in a day or so.  SYMPTOMS TO REPORT IMMEDIATELY:   Following lower endoscopy (colonoscopy or flexible sigmoidoscopy):  Excessive amounts of blood in the stool  Significant tenderness or worsening of abdominal pains  Swelling of the abdomen that is new, acute  Fever of 100F or higher  For urgent or emergent issues, a gastroenterologist can be reached at any hour by calling [phone].   DIET:  We do recommend a small meal at first, but then you may proceed to your regular diet.  Drink plenty of fluids but you should  avoid alcoholic beverages for 24 hours.  ACTIVITY:  You should plan to take it easy for the rest of today and you should NOT DRIVE or use heavy machinery until tomorrow (because of the sedation medicines used during the test).    FOLLOW UP: Our staff will call the number listed on your records the next business day following your procedure to check on you and address any questions or concerns that you may have regarding the information given to you following your procedure. If we do not reach you, we will leave a message.  However, if you are feeling well and you are not experiencing any problems, there is no need to return our call.  We will assume that you have returned to your regular daily activities without incident.  If any biopsies were taken you will be contacted by phone or by letter within the next 1-3 weeks.  Please call us at [phone] if you have not heard about the biopsies in 3 weeks.    SIGNATURES/CONFIDENTIALITY: You and/or your care partner have signed paperwork which will be entered into your electronic medical record.  These signatures attest to the fact that that the information above on your After Visit Summary has been reviewed and is understood.  Full responsibility of the confidentiality of this discharge information lies with you and/or your care-partner.

## 2015-09-30 NOTE — Op Note (Signed)
[facility] Patient Name: Eddie Mcdaniel Procedure Date: 09/30/2015 9:45 AM MRN: [id number] Endoscopist: Gatha Mayer , MD Age: 75 Referring MD:  Date of Birth: 06-26-1940 Gender: Male Account #: [id number] Procedure:                Colonoscopy Indications:              Screening for colorectal malignant neoplasm, This                            is the patient's first colonoscopy Medicines:                Propofol per Anesthesia, Monitored Anesthesia Care Procedure:                Pre-Anesthesia Assessment:                           - Prior to the procedure, a History and Physical                            was performed, and patient medications and                            allergies were reviewed. The patient's tolerance of                            previous anesthesia was also reviewed. The risks                            and benefits of the procedure and the sedation                            options and risks were discussed with the patient.                            All questions were answered, and informed consent                            was obtained. Prior Anticoagulants: The patient has                            taken no previous anticoagulant or antiplatelet                            agents. ASA Grade Assessment: II - A patient with                            mild systemic disease. After reviewing the risks                            and benefits, the patient was deemed in                            satisfactory condition to undergo the procedure.  After obtaining informed consent, the colonoscope                            was passed under direct vision. Throughout the                            procedure, the patient's blood pressure, pulse, and                            oxygen saturations were monitored continuously. The                            Model PCF-H190L [id number]) scope was introduced   through the anus and advanced to the the cecum,                            identified by appendiceal orifice and ileocecal                            valve. The ileocecal valve, appendiceal orifice,                            and rectum were photographed. The colonoscopy was                            performed without difficulty. The patient tolerated                            the procedure well. The quality of the bowel                            preparation was excellent. The bowel preparation                            used was Miralax. Scope In: 9:55:18 AM Scope Out: [id number] AM Scope Withdrawal Time: 0 hours 10 minutes 21 seconds  Total Procedure Duration: 0 hours 11 minutes 28 seconds  Findings:                 The perianal and digital rectal examinations were                            normal.                           Two sessile polyps were found in the distal sigmoid                            colon. The polyps were 3 to 10 mm in size. These                            polyps were removed with a cold snare. Resection                            and retrieval were  complete. Verification of                            patient identification for the specimen was done.                            Estimated blood loss was minimal.                           The exam was otherwise without abnormality on                            direct and retroflexion views. Complications:            No immediate complications. Estimated Blood Loss:     Estimated blood loss was minimal. Impression:               - Two 3 to 10 mm polyps in the distal sigmoid                            colon, removed with a cold snare. Resected and                            retrieved.                           - The examination was otherwise normal on direct                            and retroflexion views. Recommendation:           - Patient has a contact number available for                            emergencies.  The signs and symptoms of potential                            delayed complications were discussed with the                            patient. Return to normal activities tomorrow.                            Written discharge instructions were provided to the                            patient.                           - Resume previous diet.                           - Continue present medications.                           - Repeat colonoscopy is recommended. The  colonoscopy date will be determined after pathology                            results from today's exam become available for                            review. Gatha Mayer, MD 09/30/2015 10:15:14 AM This report has been signed electronically.

## 2015-09-30 NOTE — Progress Notes (Signed)
Called to room to assist during endoscopic procedure.  Patient ID and intended procedure confirmed with present staff. Received instructions for my participation in the procedure from the performing physician.  

## 2015-10-04 ENCOUNTER — Telehealth: Payer: Self-pay | Admitting: *Deleted

## 2015-10-04 NOTE — Telephone Encounter (Signed)
  Follow up Call-  Call back number 09/30/2015  Post procedure Call Back phone  # [phone]   Permission to leave phone message Yes  Some recent data might be hidden     Patient questions:  Do you have a fever, pain , or abdominal swelling? No. Pain Score  0 *  Have you tolerated food without any problems? Yes.    Have you been able to return to your normal activities? Yes.    Do you have any questions about your discharge instructions: Diet   No. Medications  No. Follow up visit  No.  Do you have questions or concerns about your Care? No.  Actions: * If pain score is 4 or above: No action needed, pain <4.

## 2015-10-06 ENCOUNTER — Encounter: Payer: Self-pay | Admitting: Internal Medicine

## 2015-10-06 DIAGNOSIS — Z860101 Personal history of adenomatous and serrated colon polyps: Secondary | ICD-10-CM

## 2015-10-06 DIAGNOSIS — Z86010 Personal history of colonic polyps: Secondary | ICD-10-CM

## 2015-10-06 HISTORY — DX: Personal history of adenomatous and serrated colon polyps: Z86.0101

## 2015-10-06 HISTORY — DX: Personal history of colonic polyps: Z86.010

## 2015-10-06 NOTE — Progress Notes (Signed)
Single adenoma 10 mm - recall 2020

## 2015-10-10 ENCOUNTER — Telehealth: Payer: Self-pay | Admitting: Family Medicine

## 2015-10-10 ENCOUNTER — Other Ambulatory Visit: Payer: Self-pay | Admitting: Family Medicine

## 2015-10-10 DIAGNOSIS — N400 Enlarged prostate without lower urinary tract symptoms: Secondary | ICD-10-CM

## 2015-10-10 NOTE — Telephone Encounter (Signed)
Pt was not seen today--- his wife was---- [facility]-- because of his insurance he needs a new referral every year--- it was supposed to come to me -- not Delsa Sale

## 2015-10-10 NOTE — Telephone Encounter (Signed)
Relation to [id number] Call back number:[phone]   Reason for call:  Patient would like to be referred to [facility]: Bernestine Amass MD [address], [place], [place] [address] [phone]. Patient was informed by PCP to inform referral coordinator, patient was seen today

## 2015-10-10 NOTE — Telephone Encounter (Signed)
Please advise      KP 

## 2015-10-10 NOTE — Telephone Encounter (Signed)
I do not see a ofc from today nor is there a referral to urology, please advise

## 2015-10-21 ENCOUNTER — Other Ambulatory Visit: Payer: Self-pay | Admitting: Family Medicine

## 2015-10-21 DIAGNOSIS — E785 Hyperlipidemia, unspecified: Secondary | ICD-10-CM

## 2015-10-25 ENCOUNTER — Ambulatory Visit (INDEPENDENT_AMBULATORY_CARE_PROVIDER_SITE_OTHER): Payer: Commercial Managed Care - HMO | Admitting: Family Medicine

## 2015-10-25 ENCOUNTER — Encounter: Payer: Self-pay | Admitting: Family Medicine

## 2015-10-25 ENCOUNTER — Telehealth: Payer: Self-pay | Admitting: Family Medicine

## 2015-10-25 VITALS — BP 199/95 | HR 59 | Temp 97.9°F | Resp 16 | Ht 65.0 in | Wt 147.4 lb

## 2015-10-25 DIAGNOSIS — E785 Hyperlipidemia, unspecified: Secondary | ICD-10-CM | POA: Diagnosis not present

## 2015-10-25 DIAGNOSIS — R079 Chest pain, unspecified: Secondary | ICD-10-CM | POA: Diagnosis not present

## 2015-10-25 DIAGNOSIS — I10 Essential (primary) hypertension: Secondary | ICD-10-CM

## 2015-10-25 DIAGNOSIS — Z23 Encounter for immunization: Secondary | ICD-10-CM

## 2015-10-25 DIAGNOSIS — Z0000 Encounter for general adult medical examination without abnormal findings: Secondary | ICD-10-CM

## 2015-10-25 DIAGNOSIS — IMO0001 Reserved for inherently not codable concepts without codable children: Secondary | ICD-10-CM

## 2015-10-25 DIAGNOSIS — E1165 Type 2 diabetes mellitus with hyperglycemia: Secondary | ICD-10-CM

## 2015-10-25 LAB — CBC WITH DIFFERENTIAL/PLATELET
BASOS ABS: 0 10*3/uL (ref 0.0–0.1)
Basophils Relative: 0.3 % (ref 0.0–3.0)
EOS PCT: 3.2 % (ref 0.0–5.0)
Eosinophils Absolute: 0.3 10*3/uL (ref 0.0–0.7)
HCT: 38.8 % — ABNORMAL LOW (ref 39.0–52.0)
HEMOGLOBIN: 13.2 g/dL (ref 13.0–17.0)
Lymphocytes Relative: 24.5 % (ref 12.0–46.0)
Lymphs Abs: 2.1 10*3/uL (ref 0.7–4.0)
MCHC: 33.9 g/dL (ref 30.0–36.0)
MCV: 88.3 fl (ref 78.0–100.0)
MONO ABS: 0.6 10*3/uL (ref 0.1–1.0)
MONOS PCT: 6.5 % (ref 3.0–12.0)
Neutro Abs: 5.6 10*3/uL (ref 1.4–7.7)
Neutrophils Relative %: 65.5 % (ref 43.0–77.0)
Platelets: 154 10*3/uL (ref 150.0–400.0)
RBC: 4.4 Mil/uL (ref 4.22–5.81)
RDW: 13.1 % (ref 11.5–15.5)
WBC: 8.5 10*3/uL (ref 4.0–10.5)

## 2015-10-25 LAB — POCT URINALYSIS DIPSTICK
Bilirubin, UA: NEGATIVE
Blood, UA: NEGATIVE
Glucose, UA: NEGATIVE
KETONES UA: NEGATIVE
LEUKOCYTES UA: NEGATIVE
NITRITE UA: NEGATIVE
PH UA: 6
PROTEIN UA: NEGATIVE
Spec Grav, UA: 1.015
Urobilinogen, UA: 0.2

## 2015-10-25 LAB — COMPREHENSIVE METABOLIC PANEL
ALK PHOS: 38 U/L — AB (ref 39–117)
ALT: 15 U/L (ref 0–53)
AST: 18 U/L (ref 0–37)
Albumin: 4.1 g/dL (ref 3.5–5.2)
BILIRUBIN TOTAL: 0.7 mg/dL (ref 0.2–1.2)
BUN: 10 mg/dL (ref 6–23)
CO2: 30 meq/L (ref 19–32)
Calcium: 9.1 mg/dL (ref 8.4–10.5)
Chloride: 102 mEq/L (ref 96–112)
Creatinine, Ser: 0.74 mg/dL (ref 0.40–1.50)
GFR: 110.06 mL/min (ref >60.00)
GLUCOSE: 100 mg/dL — AB (ref 70–99)
Potassium: 4.8 mEq/L (ref 3.5–5.1)
SODIUM: 137 meq/L (ref 135–145)
Total Protein: 7 g/dL (ref 6.0–8.3)

## 2015-10-25 LAB — LIPID PANEL
CHOLESTEROL: 97 mg/dL (ref 0–200)
HDL: 31.3 mg/dL — ABNORMAL LOW (ref >39.00)
LDL Cholesterol: 45 mg/dL (ref 0–99)
NONHDL: 65.25
Total CHOL/HDL Ratio: 3
Triglycerides: 103 mg/dL (ref 0.0–149.0)
VLDL: 20.6 mg/dL (ref 0.0–40.0)

## 2015-10-25 LAB — HEMOGLOBIN A1C: HEMOGLOBIN A1C: 6 % (ref 4.6–6.5)

## 2015-10-25 LAB — TROPONIN I: TNIDX: 0.01 ug/L (ref 0.00–0.06)

## 2015-10-25 MED ORDER — CARVEDILOL 12.5 MG PO TABS: 12.5000 mg | ORAL_TABLET | Freq: Two times a day (BID) | ORAL | 3 refills | Status: DC

## 2015-10-25 MED ORDER — ATORVASTATIN CALCIUM 10 MG PO TABS: 10.0000 mg | ORAL_TABLET | Freq: Every day | ORAL | 1 refills | Status: DC

## 2015-10-25 MED ORDER — METFORMIN HCL ER 750 MG PO TB24: 750.0000 mg | ORAL_TABLET | Freq: Two times a day (BID) | ORAL | 1 refills | Status: DC

## 2015-10-25 MED ORDER — CARVEDILOL 6.25 MG PO TABS: ORAL_TABLET | ORAL | 1 refills | Status: DC

## 2015-10-25 NOTE — Patient Instructions (Addendum)

## 2015-10-25 NOTE — Telephone Encounter (Signed)
Patient would like to get his Pneumonia shot as soon as possible. Please let me know if he is able to do that and when. I will call and get the patient scheduled.   Patient Relation:Self Patient Phone: [phone]

## 2015-10-25 NOTE — Telephone Encounter (Addendum)
Patient has had both pneumonia vaccines. Please advise if he should get another 23.    KP

## 2015-10-25 NOTE — Progress Notes (Signed)
Cardiology Office Note    Date:  10/28/2015   ID:  Eddie, Mcdaniel Jul 28, 1940, MRN [id number]  PCP:  Ann Held, DO  Cardiologist:  New --> Dr. Harrington Challenger  CC: chest pain with exertion  History of Present Illness:  Eddie Mcdaniel is a 75 y.o. male with a history of DMT2, GERD, HLD and HTN who presents to clinic for evaluation of chest pain.   He recently saw his Dr. Etter Sjogren, his PCP for yearly check up. ECG showed TWI in lead III and V3-V and high voltage. He reported exertional chest pain and was referred to cardiology for further evaluation.  He was walks 7 days a week in the AM and PM. Mostly at night he notes bilateral chest tightness with radiation into his left arm. He also notes tingling and a tired feeling in his legs (mostly right) while walking but sometimes at night as well. Of note he had ABIs in 12/2014 that were normal.  Interestingly he does not have any chest pain with exertion in the AM. He recently started an exercise program 6-8 months ago doing yoga and push ups/sit ups. He started noticing the chest tightness only a few weeks ago. It does improve after the walk. His breathing is okay when walking and he denies any significant DOE.   He is a never drinker or smoker and he is vegetarian. He used to chew indian chewing tobacco but quit 3 years ago.   Mother had HTN and DMT2 and father had no medical problems. One of his brother's died from a heart attack in his 22s but he smoked. Another brother died of a CVA.      Past Medical History:  Diagnosis Date  . Diabetes mellitus    Type 2  . GERD (gastroesophageal reflux disease)   . Hx of adenomatous polyp of colon 10/06/2015  . Hyperlipidemia   . Hypertension     Past Surgical History:  Procedure Laterality Date  . EYE SURGERY  2005  . SHOULDER ARTHROSCOPY W/ ROTATOR CUFF REPAIR     2x     Current Medications: Outpatient Medications Prior to Visit  Medication Sig Dispense Refill  . aspirin 81  MG chewable tablet Chew 81 mg by mouth daily.      Marland Kitchen atorvastatin (LIPITOR) 10 MG tablet Take 1 tablet (10 mg total) by mouth daily. 90 tablet 1  . Blood Glucose Monitoring Suppl (TRUE METRIX AIR GLUCOSE METER) W/DEVICE KIT 1 Device by Does not apply route daily. Check blood sugar twice daily. Dx. E11.9 1 kit 0  . carvedilol (COREG) 12.5 MG tablet Take 1 tablet (12.5 mg total) by mouth 2 (two) times daily with a meal. 180 tablet 2  . CINNAMON PO Take by mouth daily.     . metFORMIN (GLUCOPHAGE-XR) 750 MG 24 hr tablet Take 1 tablet (750 mg total) by mouth 2 (two) times daily. 180 tablet 1  . ONE TOUCH ULTRA TEST test strip Check blood sugar once a day. Dx 250.00 300 each 4  . TRUE METRIX BLOOD GLUCOSE TEST test strip Check blood sugar twice daily. Dx. E11.9 100 each 12  . TRUEPLUS LANCETS 33G MISC Check blood sugar twice daily. Dx. E11.9 100 each 12  . Turmeric 450 MG CAPS Take 1 tablet by mouth daily.     Facility-Administered Medications Prior to Visit  Medication Dose Route Frequency Provider Last Rate Last Dose  . 0.9 %  sodium chloride infusion  500 mL  Intravenous Continuous Gatha Mayer, MD      . dextrose 5 % solution   Intravenous Continuous Gatha Mayer, MD         Allergies:   Ace inhibitors   Social History   Social History  . Marital status: Married    Spouse name: N/A  . Number of children: N/A  . Years of education: N/A   Occupational History  . retired    Social History Main Topics  . Smoking status: Never Smoker  . Smokeless tobacco: Former Systems developer    Types: Chew    Quit date: 04/30/2010  . Alcohol use No  . Drug use: No  . Sexual activity: Yes    Partners: Female   Other Topics Concern  . None   Social History Narrative   Walking daily   yoga           Family History:  The patient'sfamily history includes Cancer (age of onset: 60) in his brother; Diabetes in his brother and mother; Heart disease in his brother and brother; Hyperlipidemia in his  brother; Hypertension in his brother and mother; Stroke in his brother.     ROS:   Please see the history of present illness.    ROS All other systems reviewed and are negative.   PHYSICAL EXAM:   VS:  BP 124/78   Pulse 60   Ht '5\' 5"'$  (1.651 m)   Wt 147 lb (66.7 kg)   BMI 24.46 kg/m    GEN: Well nourished, well developed, in no acute distress  HEENT: normal  Neck: no JVD, carotid bruits, or masses Cardiac: RRR; no murmurs, rubs, or gallops,no edema  Respiratory:  clear to auscultation bilaterally, normal work of breathing GI: soft, nontender, nondistended, + BS MS: no deformity or atrophy  Skin: warm and dry, no rash Neuro:  Alert and Oriented x 3, Strength and sensation are intact Psych: euthymic mood, full affect  Wt Readings from Last 3 Encounters:  10/28/15 147 lb (66.7 kg)  10/25/15 147 lb 6.4 oz (66.9 kg)  09/30/15 151 lb (68.5 kg)      Studies/Labs Reviewed:   EKG:  EKG is NOT ordered today.    Recent Labs: 10/25/2015: ALT 15; BUN 10; Creatinine, Ser 0.74; Hemoglobin 13.2; Platelets 154.0; Potassium 4.8; Sodium 137   Lipid Panel    Component Value Date/Time   CHOL 97 10/25/2015 1127   TRIG 103.0 10/25/2015 1127   HDL 31.30 (L) 10/25/2015 1127   CHOLHDL 3 10/25/2015 1127   VLDL 20.6 10/25/2015 1127   LDLCALC 45 10/25/2015 1127    Additional studies/ records that were reviewed today include:  none   ASSESSMENT & PLAN:   Chest pain: he is at intermediate risk for CAD. He has RFs including HTN, HLD, DMT2, family hx ( in brother who was a smoker). ECG with non specific TWIs in lead III and V3-V5 and high voltage. Chest pain is atypical in that it only happens at night on walks and no DOE. Will plan for an exercise myoview and 2D ECHO.   DMT2: HgA1c well controlled at 6.0  HTN: BP well controlled  HLD: LDL excellent at 45 on atorvastatin '10mg'$  daily   LE tingling:  tingling and a tired feeling in his legs (mostly right) while walking but sometimes at  night as well.  ABIs in 12/2014 were normal. 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  Concerns regarding medicines are outlined above.  Medication changes, Labs and Tests ordered today are listed in the Patient Instructions below. Patient Instructions  Medication Instructions:  Your physician recommends that you continue on your current medications as directed. Please refer to the Current Medication list given to you today.  Labwork: NONE  Testing/Procedures: Your physician has requested that you have an echocardiogram as soon as possible. Echocardiography is a painless test that uses sound waves to create images of your heart. It provides your doctor with information about the size and shape of your heart and how well your heart's chambers and valves are working. This procedure takes approximately one hour. There are no restrictions for this procedure.  Your physician has requested that you have en exercise stress myoview as soon as possible. For further information please visit [email or web address]. Please follow instruction sheet, as given.  Follow-Up: Your physician wants you to follow-up with Dr. Harrington Challenger as needed if stress test and echocardiogram are normal.  If you need a refill on your cardiac medications before your next appointment, please call your pharmacy.    Echocardiogram An echocardiogram, or echocardiography, uses sound waves (ultrasound) to produce an image of your heart. The echocardiogram is simple, painless, obtained within a short period of time, and offers valuable information to your health care provider. The images from an echocardiogram can provide information such as:  Evidence of coronary artery disease (CAD).  Heart size.  Heart muscle function.  Heart valve function.  Aneurysm detection.  Evidence of a past heart attack.  Fluid  buildup around the heart.  Heart muscle thickening.  Assess heart valve function. LET [facility] CARE PROVIDER KNOW ABOUT:  Any allergies you have.  All medicines you are taking, including vitamins, herbs, eye drops, creams, and over-the-counter medicines.  Previous problems you or members of your family have had with the use of anesthetics.  Any blood disorders you have.  Previous surgeries you have had.  Medical conditions you have.  Possibility of pregnancy, if this applies. BEFORE THE PROCEDURE  No special preparation is needed. Eat and drink normally.  PROCEDURE   In order to produce an image of your heart, gel will be applied to your chest and a wand-like tool (transducer) will be moved over your chest. The gel will help transmit the sound waves from the transducer. The sound waves will harmlessly bounce off your heart to allow the heart images to be captured in real-time motion. These images will then be recorded.  You may need an IV to receive a medicine that improves the quality of the pictures. AFTER THE PROCEDURE You may return to your normal schedule including diet, activities, and medicines, unless your health care provider tells you otherwise.   This information is not intended to replace advice given to you by your health care provider. Make sure you discuss any questions you have with your health care provider.   Document Released: 01/13/2000 Document Revised: 02/05/2014 Document Reviewed: 09/22/2012 Elsevier Interactive Patient Education 2016 [place], [place], [place]  10/28/2015 11:25 AM    [address], [place], [place]  [address] Phone: [phone]; Fax: [phone]

## 2015-10-25 NOTE — Progress Notes (Signed)
Subjective:   Eddie Mcdaniel is a 75 y.o. male who presents for Medicare Annual/Subsequent preventive examination. Pt c/o chest pain at night with walking 45 min to hour -- but only at night.  In am they walk and he has no pain.  Pain last - resolves right away after he stops walking  Review of Systems:   Review of Systems  Constitutional: Negative for activity change, appetite change and fatigue.  HENT: Negative for hearing loss, congestion, tinnitus and ear discharge.   Eyes: Negative for visual disturbance (see optho q1y -- vision corrected to 20/20 with glasses).  Respiratory: Negative for cough, chest tightness and shortness of breath.   Cardiovascular: + chest pain with exertion every day Gastrointestinal: Negative for abdominal pain, diarrhea, constipation and abdominal distention.  Genitourinary: Negative for urgency, frequency, decreased urine volume and difficulty urinating.  Musculoskeletal: Negative for back pain, arthralgias and gait problem.  Skin: Negative for color change, pallor and rash.  Neurological: Negative for dizziness, light-headedness, numbness and headaches.  Hematological: Negative for adenopathy. Does not bruise/bleed easily.  Psychiatric/Behavioral: Negative for suicidal ideas, confusion, sleep disturbance, self-injury, dysphoric mood, decreased concentration and agitation.  Pt is able to read and write and can do all ADLs No risk for falling No abuse/ violence in home          Objective:    Vitals: BP (!) 199/95 (BP Location: Right Arm, Patient Position: Sitting, Cuff Size: Normal)   Pulse (!) 59   Temp 97.9 F (36.6 C) (Oral)   Resp 16   Ht _0  (1.651 m)   Wt 147 lb 6.4 oz (66.9 kg)   SpO2 94%   BMI 24.53 kg/m   Body mass index is 24.53 kg/m.  BP (!) 199/95 (BP Location: Right Arm, Patient Position: Sitting, Cuff Size: Normal)   Pulse (!) 59   Temp 97.9 F (36.6 C) (Oral)   Resp 16   Ht _1  (1.651 m)   Wt 147 lb 6.4 oz (66.9  kg)   SpO2 94%   BMI 24.53 kg/m  General appearance: alert, cooperative, appears stated age and no distress Head: Normocephalic, without obvious abnormality, atraumatic Eyes: conjunctivae/corneas clear. PERRL, EOM's intact. Fundi benign. Ears: normal TM's and external ear canals both ears Nose: Nares normal. Septum midline. Mucosa normal. No drainage or sinus tenderness. Throat: lips, mucosa, and tongue normal; teeth and gums normal Neck: no adenopathy, no carotid bruit, no JVD, supple, symmetrical, trachea midline and thyroid not enlarged, symmetric, no tenderness/mass/nodules Back: symmetric, no curvature. ROM normal. No CVA tenderness. Lungs: clear to auscultation bilaterally Chest wall: no tenderness Heart: regular rate and rhythm, S1, S2 normal, no murmur, click, rub or gallop Abdomen: soft, non-tender; bowel sounds normal; no masses,  no organomegaly Male genitalia: normal Rectal: deferred--urology Extremities: extremities normal, atraumatic, no cyanosis or edema Pulses: 2+ and symmetric Skin: Skin color, texture, turgor normal. No rashes or lesions Lymph nodes: Cervical, supraclavicular, and axillary nodes normal. Neurologic: Alert and oriented X 3, normal strength and tone. Normal symmetric reflexes. Normal coordination and gait  Tobacco History  Smoking Status  . Never Smoker  Smokeless Tobacco  . Former Systems developer  . Types: Chew  . Quit date: 04/30/2010     Counseling given: Not Answered   Past Medical History:  Diagnosis Date  . Diabetes mellitus    Type 2  . GERD (gastroesophageal reflux disease)   . Hx of adenomatous polyp of colon 10/06/2015  . Hyperlipidemia   . Hypertension  Past Surgical History:  Procedure Laterality Date  . EYE SURGERY  2005  . SHOULDER ARTHROSCOPY W/ ROTATOR CUFF REPAIR     2x    Family History  Problem Relation Age of Onset  . Diabetes Mother   . Hypertension Mother   . Cancer Brother 28    brain, stomach  . Hyperlipidemia Brother    . Heart disease Brother     MI  . Heart disease Brother     MI  . Diabetes Brother   . Stroke Brother   . Hypertension Brother   . Colon cancer Neg Hx   . Esophageal cancer Neg Hx   . Rectal cancer Neg Hx   . Stomach cancer Neg Hx    History  Sexual Activity  . Sexual activity: Yes  . Partners: Female    Outpatient Encounter Prescriptions as of 10/25/2015  Medication Sig  . aspirin 81 MG chewable tablet Chew 81 mg by mouth daily.    Marland Kitchen atorvastatin (LIPITOR) 10 MG tablet Take 1 tablet (10 mg total) by mouth daily.  . Blood Glucose Monitoring Suppl (TRUE METRIX AIR GLUCOSE METER) W/DEVICE KIT 1 Device by Does not apply route daily. Check blood sugar twice daily. Dx. E11.9  . CINNAMON PO Take by mouth.  . metFORMIN (GLUCOPHAGE-XR) 750 MG 24 hr tablet Take 1 tablet (750 mg total) by mouth 2 (two) times daily.  . ONE TOUCH ULTRA TEST test strip Check blood sugar once a day. Dx 250.00  . TRUE METRIX BLOOD GLUCOSE TEST test strip Check blood sugar twice daily. Dx. E11.9  . TRUEPLUS LANCETS 33G MISC Check blood sugar twice daily. Dx. E11.9  . Turmeric 450 MG CAPS Take 1 tablet by mouth daily.  . [DISCONTINUED] atorvastatin (LIPITOR) 10 MG tablet TAKE 1 TABLET EVERY DAY  . [DISCONTINUED] carvedilol (COREG) 6.25 MG tablet TAKE 1 TABLET TWICE A DAY WITH A MEAL  . [DISCONTINUED] carvedilol (COREG) 6.25 MG tablet TAKE 1 TABLET TWICE A DAY WITH A MEAL  . [DISCONTINUED] metFORMIN (GLUCOPHAGE-XR) 750 MG 24 hr tablet Take 1 tablet (750 mg total) by mouth 2 (two) times daily.  . [DISCONTINUED] ONETOUCH DELICA LANCETS 26Z MISC 1 Device by Does not apply route daily. DX 250.00  . [DISCONTINUED] carvedilol (COREG) 12.5 MG tablet Take 1 tablet (12.5 mg total) by mouth 2 (two) times daily with a meal.   Facility-Administered Encounter Medications as of 10/25/2015  Medication  . 0.9 %  sodium chloride infusion  . dextrose 5 % solution    Activities of Daily Living In your present state of health,  do you have any difficulty performing the following activities: 10/25/2015  Hearing? Y  Vision? N  Difficulty concentrating or making decisions? Y  Walking or climbing stairs? N  Dressing or bathing? N  Doing errands, shopping? N  Some recent data might be hidden    Patient Care Team: Ann Held, DO as PCP - General Rana Snare, MD as Consulting Physician (Urology) Gatha Mayer, MD as Consulting Physician (Gastroenterology)   Assessment:    CPe Exercise Activities and Dietary recommendations Current Exercise Habits: Home exercise routine, Type of exercise: walking, Time (Minutes): 60, Frequency (Times/Week): 5, Weekly Exercise (Minutes/Week): 300, Intensity: Moderate, Exercise limited by: cardiac condition(s)  Goals    None    - Fall Risk Fall Risk  10/25/2015 08/19/2014 03/25/2013 12/21/2011  Falls in the past year? No No No No   Depression Screen [facility] 2/9 Scores 10/25/2015 08/19/2014 03/25/2013 12/21/2011  PHQ - 2 Score 0 0 0 0     Cognitive Testing MMSE - Mini Mental State Exam 10/25/2015  Not completed: Unable to complete    Immunization History  Administered Date(s) Administered  . Influenza Split 10/30/2010, 12/21/2011  . Influenza Whole 11/18/2009  . Influenza, High Dose Seasonal PF 02/28/2015, 10/25/2015  . Influenza,inj,Quad PF,36+ Mos 03/25/2013, 12/17/2013  . Pneumococcal Conjugate-13 09/08/2013  . Pneumococcal Polysaccharide-23 10/24/2006   Screening Tests Health Maintenance  Topic Date Due  . TETANUS/TDAP  05/14/1961  . FOOT EXAM  09/09/2014  . PNA vac Low Risk Adult (2 of 2 - PPSV23) 02/28/2016 (Originally 09/09/2014)  . OPHTHALMOLOGY EXAM  10/24/2016 (Originally 08/06/2015)  . URINE MICROALBUMIN  02/28/2016  . HEMOGLOBIN A1C  04/23/2016  . COLONOSCOPY  09/30/2018  . INFLUENZA VACCINE  Completed  . ZOSTAVAX  Addressed      Plan:    During the course of the visit the patient was educated and counseled about the following appropriate  screening and preventive services:   Vaccines to include Pneumoccal, Influenza, Hepatitis B, Td, Zostavax, HCV  Electrocardiogram  Cardiovascular Disease  Colorectal cancer screening  Diabetes screening  Prostate Cancer Screening  Glaucoma screening  Nutrition counseling   Smoking cessation counseling  Patient Instructions (the written plan) was given to the patient.   1. Chest pain, unspecified chest pain type Pt planning to leave the country [place] 4 months. Pt instructed to rest -- cardiology appointment Friday If pain returns -- go to ER - EKG 12-Lead - ECHOCARDIOGRAM COMPLETE; Future - Myocardial Perfusion Imaging; Future - Ambulatory referral to Cardiology - Troponin I  2. Preventative health care See above  3. Uncontrolled type 2 diabetes mellitus without complication, without long-term current use of insulin (HCC) con't meds Check labs - metFORMIN (GLUCOPHAGE-XR) 750 MG 24 hr tablet; Take 1 tablet (750 mg total) by mouth 2 (two) times daily.  Dispense: 180 tablet; Refill: 1 - Comprehensive metabolic panel - Hemoglobin A1c - CBC with Differential/Platelet - POCT urinalysis dipstick  4. Essential hypertension Check labs stable - Comprehensive metabolic panel - CBC with Differential/Platelet - POCT urinalysis dipstick  5. Hyperlipidemia LDL goal <70 con't meds Check labs - atorvastatin (LIPITOR) 10 MG tablet; Take 1 tablet (10 mg total) by mouth daily.  Dispense: 90 tablet; Refill: 1 - Comprehensive metabolic panel - Lipid panel - CBC with Differential/Platelet - POCT urinalysis dipstick  6. Routine history and physical examination of adult   Ann Held, DO  10/26/2015

## 2015-10-25 NOTE — Progress Notes (Signed)
Pre visit review using our clinic review tool, if applicable. No additional management support is needed unless otherwise documented below in the visit note. 

## 2015-10-26 ENCOUNTER — Other Ambulatory Visit: Payer: Self-pay

## 2015-10-26 ENCOUNTER — Telehealth: Payer: Self-pay

## 2015-10-26 DIAGNOSIS — I10 Essential (primary) hypertension: Secondary | ICD-10-CM

## 2015-10-26 MED ORDER — CARVEDILOL 12.5 MG PO TABS: 12.5000 mg | ORAL_TABLET | Freq: Two times a day (BID) | ORAL | 2 refills | Status: DC

## 2015-10-26 NOTE — Telephone Encounter (Signed)
-----   Message from Rudene Anda, RN sent at 10/25/2015 11:23 AM EDT -----   ----- Message ----- From: Ann Held, DO Sent: 10/25/2015  11:21 AM To: Rudene Anda, RN  Please cancel coreg 6.25

## 2015-10-26 NOTE — Telephone Encounter (Signed)
I called [employer] and cancelled the Coreg 6.25, they pharmacist stated they never received the second script so I re-faxed the Coreg 12.5 bid #180 with 2.     KP

## 2015-10-27 ENCOUNTER — Ambulatory Visit: Payer: Commercial Managed Care - HMO

## 2015-10-27 DIAGNOSIS — Z23 Encounter for immunization: Secondary | ICD-10-CM

## 2015-10-27 NOTE — Telephone Encounter (Signed)
No need-- if he has had both

## 2015-10-27 NOTE — Telephone Encounter (Signed)
Discussed w/ Dr. Carollee Herter, since pt had first PPSV-23 prior to age 75, he IS due for 2nd PPSV-23. Will administer PPPSV-23 today as scheduled per PCP.

## 2015-10-28 ENCOUNTER — Ambulatory Visit (INDEPENDENT_AMBULATORY_CARE_PROVIDER_SITE_OTHER): Payer: Commercial Managed Care - HMO | Admitting: Physician Assistant

## 2015-10-28 ENCOUNTER — Encounter: Payer: Self-pay | Admitting: Physician Assistant

## 2015-10-28 ENCOUNTER — Ambulatory Visit (HOSPITAL_COMMUNITY): Payer: Commercial Managed Care - HMO | Attending: Cardiology

## 2015-10-28 ENCOUNTER — Other Ambulatory Visit: Payer: Self-pay

## 2015-10-28 VITALS — BP 124/78 | HR 60 | Ht 65.0 in | Wt 147.0 lb

## 2015-10-28 DIAGNOSIS — I351 Nonrheumatic aortic (valve) insufficiency: Secondary | ICD-10-CM | POA: Insufficient documentation

## 2015-10-28 DIAGNOSIS — Z8249 Family history of ischemic heart disease and other diseases of the circulatory system: Secondary | ICD-10-CM | POA: Diagnosis not present

## 2015-10-28 DIAGNOSIS — I10 Essential (primary) hypertension: Secondary | ICD-10-CM

## 2015-10-28 DIAGNOSIS — E118 Type 2 diabetes mellitus with unspecified complications: Secondary | ICD-10-CM

## 2015-10-28 DIAGNOSIS — R079 Chest pain, unspecified: Secondary | ICD-10-CM | POA: Diagnosis present

## 2015-10-28 DIAGNOSIS — R0789 Other chest pain: Secondary | ICD-10-CM | POA: Insufficient documentation

## 2015-10-28 DIAGNOSIS — I071 Rheumatic tricuspid insufficiency: Secondary | ICD-10-CM | POA: Diagnosis not present

## 2015-10-28 DIAGNOSIS — E119 Type 2 diabetes mellitus without complications: Secondary | ICD-10-CM | POA: Diagnosis not present

## 2015-10-28 DIAGNOSIS — I119 Hypertensive heart disease without heart failure: Secondary | ICD-10-CM | POA: Diagnosis not present

## 2015-10-28 DIAGNOSIS — E785 Hyperlipidemia, unspecified: Secondary | ICD-10-CM | POA: Diagnosis not present

## 2015-10-28 LAB — ECHOCARDIOGRAM COMPLETE
Height: 65 in
Weight: 2352 oz

## 2015-10-28 NOTE — Patient Instructions (Addendum)
Medication Instructions:  Your physician recommends that you continue on your current medications as directed. Please refer to the Current Medication list given to you today.  Labwork: NONE  Testing/Procedures: Your physician has requested that you have an echocardiogram as soon as possible. Echocardiography is a painless test that uses sound waves to create images of your heart. It provides your doctor with information about the size and shape of your heart and how well your heart's chambers and valves are working. This procedure takes approximately one hour. There are no restrictions for this procedure.  Your physician has requested that you have en exercise stress myoview as soon as possible. For further information please visit [email or web address]. Please follow instruction sheet, as given.  Follow-Up: Your physician wants you to follow-up with Dr. Harrington Challenger as needed if stress test and echocardiogram are normal.  If you need a refill on your cardiac medications before your next appointment, please call your pharmacy.    Echocardiogram An echocardiogram, or echocardiography, uses sound waves (ultrasound) to produce an image of your heart. The echocardiogram is simple, painless, obtained within a short period of time, and offers valuable information to your health care provider. The images from an echocardiogram can provide information such as:  Evidence of coronary artery disease (CAD).  Heart size.  Heart muscle function.  Heart valve function.  Aneurysm detection.  Evidence of a past heart attack.  Fluid buildup around the heart.  Heart muscle thickening.  Assess heart valve function. LET [facility] CARE PROVIDER KNOW ABOUT:  Any allergies you have.  All medicines you are taking, including vitamins, herbs, eye drops, creams, and over-the-counter medicines.  Previous problems you or members of your family have had with the use of anesthetics.  Any blood disorders you  have.  Previous surgeries you have had.  Medical conditions you have.  Possibility of pregnancy, if this applies. BEFORE THE PROCEDURE  No special preparation is needed. Eat and drink normally.  PROCEDURE   In order to produce an image of your heart, gel will be applied to your chest and a wand-like tool (transducer) will be moved over your chest. The gel will help transmit the sound waves from the transducer. The sound waves will harmlessly bounce off your heart to allow the heart images to be captured in real-time motion. These images will then be recorded.  You may need an IV to receive a medicine that improves the quality of the pictures. AFTER THE PROCEDURE You may return to your normal schedule including diet, activities, and medicines, unless your health care provider tells you otherwise.   This information is not intended to replace advice given to you by your health care provider. Make sure you discuss any questions you have with your health care provider.   Document Released: 01/13/2000 Document Revised: 02/05/2014 Document Reviewed: 09/22/2012 Elsevier Interactive Patient Education [employer].

## 2015-11-02 ENCOUNTER — Telehealth: Payer: Self-pay | Admitting: Family Medicine

## 2015-11-02 NOTE — Telephone Encounter (Signed)
Patient stated that [facility] told him that they have not received the prescription of carvedilol (COREG) 12.5 MG tablet He asked if we could resend it.    Pharmacy:  [facility] - [place], [place]

## 2015-11-02 NOTE — Telephone Encounter (Signed)
Spoke with [facility]. Eddie Mcdaniel stated the reason the recent medication was not filled because two orders of the same medication were sent with different dosage and she wanted to verify which order was the correct one. Medication verified and filled.

## 2015-11-02 NOTE — Telephone Encounter (Signed)
Spoke with patient and informed patient that [name] received the current medication with correct dosage. Patient state he understands and have no questions or concerns.

## 2015-11-03 ENCOUNTER — Telehealth (HOSPITAL_COMMUNITY): Payer: Self-pay | Admitting: *Deleted

## 2015-11-03 NOTE — Telephone Encounter (Signed)
error 

## 2015-11-04 ENCOUNTER — Telehealth: Payer: Self-pay | Admitting: Internal Medicine

## 2015-11-04 NOTE — Telephone Encounter (Signed)
New Message  Pts son voiced wanting to know if PA/APP needs pts previous stress test from previous Cardiologist.  Please f/u

## 2015-11-04 NOTE — Telephone Encounter (Signed)
Returned call and they are faxing over previous stress test, echo, and lab work.

## 2015-11-07 NOTE — Telephone Encounter (Signed)
New Message  Pt voiced he is calling because he has questions about detailed instructions.  Please f/u

## 2015-11-08 ENCOUNTER — Ambulatory Visit (HOSPITAL_COMMUNITY): Payer: Commercial Managed Care - HMO | Attending: Cardiovascular Disease

## 2015-11-08 DIAGNOSIS — R0789 Other chest pain: Secondary | ICD-10-CM

## 2015-11-08 LAB — MYOCARDIAL PERFUSION IMAGING
CHL CUP MPHR: 147 {beats}/min
CHL CUP NUCLEAR SDS: 7
CHL CUP NUCLEAR SRS: 7
CHL CUP RESTING HR STRESS: 52 {beats}/min
CHL RATE OF PERCEIVED EXERTION: 19
CSEPEDS: 15 s
Estimated workload: 8.9 METS
Exercise duration (min): 7 min
LHR: 0.29
LV sys vol: 40 mL
LVDIAVOL: 87 mL (ref 62–150)
Peak HR: 131 {beats}/min
Percent HR: 89 %
SSS: 14
TID: 1.05

## 2015-11-08 MED ORDER — TECHNETIUM TC 99M TETROFOSMIN IV KIT
31.6000 | PACK | Freq: Once | INTRAVENOUS | Status: AC | PRN
  Administered 2015-11-08: 31.6 via INTRAVENOUS
  Filled 2015-11-08: qty 32

## 2015-11-08 MED ORDER — TECHNETIUM TC 99M TETROFOSMIN IV KIT
10.5000 | PACK | Freq: Once | INTRAVENOUS | Status: AC | PRN
  Administered 2015-11-08: 10.5 via INTRAVENOUS
  Filled 2015-11-08: qty 11

## 2015-11-11 ENCOUNTER — Encounter: Payer: Self-pay | Admitting: Internal Medicine

## 2015-11-11 ENCOUNTER — Ambulatory Visit (INDEPENDENT_AMBULATORY_CARE_PROVIDER_SITE_OTHER): Payer: Commercial Managed Care - HMO | Admitting: Internal Medicine

## 2015-11-11 VITALS — BP 132/84 | HR 57 | Ht 64.5 in | Wt 147.8 lb

## 2015-11-11 DIAGNOSIS — R9439 Abnormal result of other cardiovascular function study: Secondary | ICD-10-CM | POA: Diagnosis not present

## 2015-11-11 DIAGNOSIS — I209 Angina pectoris, unspecified: Secondary | ICD-10-CM

## 2015-11-11 DIAGNOSIS — Z01812 Encounter for preprocedural laboratory examination: Secondary | ICD-10-CM

## 2015-11-11 LAB — CBC WITH DIFFERENTIAL/PLATELET
BASOS PCT: 0 %
Basophils Absolute: 0 cells/uL (ref 0–200)
Eosinophils Absolute: 372 cells/uL (ref 15–500)
Eosinophils Relative: 4 %
HEMATOCRIT: 39.9 % (ref 38.5–50.0)
Hemoglobin: 13.1 g/dL — ABNORMAL LOW (ref 13.2–17.1)
LYMPHS PCT: 25 %
Lymphs Abs: 2325 cells/uL (ref 850–3900)
MCH: 29.4 pg (ref 27.0–33.0)
MCHC: 32.8 g/dL (ref 32.0–36.0)
MCV: 89.7 fL (ref 80.0–100.0)
MONO ABS: 558 {cells}/uL (ref 200–950)
MPV: 10.9 fL (ref 7.5–12.5)
Monocytes Relative: 6 %
Neutro Abs: 6045 cells/uL (ref 1500–7800)
Neutrophils Relative %: 65 %
PLATELETS: 180 10*3/uL (ref 140–400)
RBC: 4.45 MIL/uL (ref 4.20–5.80)
RDW: 12.7 % (ref 11.0–15.0)
WBC: 9.3 10*3/uL (ref 3.8–10.8)

## 2015-11-11 LAB — BASIC METABOLIC PANEL
BUN: 9 mg/dL (ref 7–25)
CO2: 27 mmol/L (ref 20–31)
Calcium: 9.9 mg/dL (ref 8.6–10.3)
Chloride: 101 mmol/L (ref 98–110)
Creat: 0.72 mg/dL (ref 0.70–1.18)
Glucose, Bld: 94 mg/dL (ref 65–99)
POTASSIUM: 4.5 mmol/L (ref 3.5–5.3)
SODIUM: 136 mmol/L (ref 135–146)

## 2015-11-11 LAB — PROTIME-INR
INR: 1.1
Prothrombin Time: 11.6 s — ABNORMAL HIGH (ref 9.0–11.5)

## 2015-11-11 MED ORDER — NITROGLYCERIN 0.4 MG SL SUBL: 0.4000 mg | SUBLINGUAL_TABLET | SUBLINGUAL | 3 refills | Status: DC | PRN

## 2015-11-11 NOTE — Progress Notes (Signed)
 Follow-up Outpatient Visit Date: 11/11/2015  Chief Complaint: chest pain and abnormal stress test  HPI:  Eddie Mcdaniel is a 75 y.o. year-old male with history of type 2 diabetes mellitus, hypertension, hyperlipidemia, and GERD, who presents for follow-up of chest pain. He was evaluated for the first time on 10/28/15 by Katie Thompson and Dr. Ross, which time he reported chest tightness radiating to the left arm. EKG was notable for anterior T-wave inversion, prompting referral for myocardial perfusion stress test. Perfusion imaging demonstrated a partially reversible apical defect. LVEF was also mildly reduced. Since his last visit with us, the patient has not had any further episodes of chest tightness. It should be noted that he has not been walking regularly though. Further characterization of his previous chest pain includes tightness across the precordium when walking, with radiation to the left arm. The pain would typically subside with 5 minutes of rest. Interestingly, this would only occur when walking in the evening after dinner. He did not have symptoms when going for walks earlier in the day. He denies accompanied symptoms such as shortness of breath, nausea, and diaphoresis. He has not had any significant bleeding. He does not have upcoming surgeries. He denies prior stroke. He is planning to travel to [place] in early November, and will be staying there until early 2018.  --------------------------------------------------------------------------------------------------  Cardiovascular History & Procedures: Cardiovascular Problems:  Chest pain and abnormal stress test  Risk Factors:  Male gender, family history, age, diabetes mellitus, hypertension, and hyperlipidemia  Cath/PCI:  None.  CV Surgery:  None.  EP Procedures and Devices:  None.  Non-Invasive Evaluation(s):  Exercise myocardial perfusion stress test (11/08/15): Small in size, severe partially reversible defect  involving the apical lateral and apical segments. LVEF mildly reduced with apical hypokinesis. The patient exercised for 7 minutes and 15 seconds achieving stage III (8.9 minutes) with peak heart rate of 131 bpm. EKG demonstrated ST depressions in V5 and V6.  Transthoracic echocardiogram (10/28/15): Normal LV size with mild LVH. LVEF 60-65% with grade 1 diastolic dysfunction. Trivial aortic regurgitation noted. Mildly increased pulmonary artery pressure. Normal RV size and function.  Recent CV Pertinent Labs: Lab Results  Component Value Date   CHOL 97 10/25/2015   HDL 31.30 (L) 10/25/2015   LDLCALC 45 10/25/2015   TRIG 103.0 10/25/2015   CHOLHDL 3 10/25/2015   K 4.8 10/25/2015   BUN 10 10/25/2015   CREATININE 0.74 10/25/2015   CREATININE 1.15 10/18/2010    Past medical and surgical history were reviewed and updated in EPIC.   Outpatient Encounter Prescriptions as of 11/11/2015  Medication Sig  . aspirin 81 MG chewable tablet Chew 81 mg by mouth daily.    . atorvastatin (LIPITOR) 10 MG tablet Take 1 tablet (10 mg total) by mouth daily.  . Blood Glucose Monitoring Suppl (TRUE METRIX AIR GLUCOSE METER) W/DEVICE KIT 1 Device by Does not apply route daily. Check blood sugar twice daily. Dx. E11.9  . carvedilol (COREG) 12.5 MG tablet Take 1 tablet (12.5 mg total) by mouth 2 (two) times daily with a meal.  . CINNAMON PO Take by mouth daily.   . metFORMIN (GLUCOPHAGE-XR) 750 MG 24 hr tablet Take 1 tablet (750 mg total) by mouth 2 (two) times daily.  . ONE TOUCH ULTRA TEST test strip Check blood sugar once a day. Dx 250.00  . TRUE METRIX BLOOD GLUCOSE TEST test strip Check blood sugar twice daily. Dx. E11.9  . TRUEPLUS LANCETS 33G MISC Check blood sugar twice   daily. Dx. E11.9  . Turmeric POWD Take by mouth daily.   Facility-Administered Encounter Medications as of 11/11/2015  Medication  . 0.9 %  sodium chloride infusion  . dextrose 5 % solution    Allergies: Ace inhibitors  Social  History   Social History  . Marital status: Married    Spouse name: N/A  . Number of children: N/A  . Years of education: N/A   Occupational History  . retired    Social History Main Topics  . Smoking status: Never Smoker  . Smokeless tobacco: Former Systems developer    Types: Chew    Quit date: 04/30/2010  . Alcohol use No  . Drug use: No  . Sexual activity: Yes    Partners: Female   Other Topics Concern  . Not on file   Social History Narrative   Walking daily   yoga          Family History  Problem Relation Age of Onset  . Diabetes Mother   . Hypertension Mother   . Cancer Brother 43    brain, stomach  . Hyperlipidemia Brother   . Heart disease Brother     MI  . Heart disease Brother     MI  . Diabetes Brother   . Stroke Brother   . Hypertension Brother   . Colon cancer Neg Hx   . Esophageal cancer Neg Hx   . Rectal cancer Neg Hx   . Stomach cancer Neg Hx     Review of Systems: A 12-system review of systems was performed and was negative except as noted in the HPI.  --------------------------------------------------------------------------------------------------  Physical Exam: BP 132/84   Pulse (!) 57   Ht 5' 4.5" (1.638 m)   Wt 147 lb 12.8 oz (67 kg)   BMI 24.98 kg/m   General:  Well-developed, well-nourished man, seated comfortably in the exam room. He is accompanied by his wife. Neck: Supple without lymphadenopathy, thyromegaly, JVD, or HJR. Lungs: Normal work of breathing.  Clear to auscultation bilaterally without wheezes or crackles. Heart: Regular rate and rhythm without murmurs, rubs, or gallops.  Non-displaced PMI. Abd: Bowel sounds present.  Soft, NT/ND without hepatosplenomegaly Ext: No lower extremity edema.  Radial, PT, and DP pulses are 2+ bilaterally. Skin: warm and dry without rash  EKG:  Sinus bradycardia (heart rate 57 bpm) with anterior T waves. No significant change from prior tracing on 10/25/15.  Lab Results  Component Value Date     WBC 8.5 10/25/2015   HGB 13.2 10/25/2015   HCT 38.8 (L) 10/25/2015   MCV 88.3 10/25/2015   PLT 154.0 10/25/2015    Lab Results  Component Value Date   NA 137 10/25/2015   K 4.8 10/25/2015   CL 102 10/25/2015   CO2 30 10/25/2015   BUN 10 10/25/2015   CREATININE 0.74 10/25/2015   GLUCOSE 100 (H) 10/25/2015   ALT 15 10/25/2015    Lab Results  Component Value Date   CHOL 97 10/25/2015   HDL 31.30 (L) 10/25/2015   LDLCALC 45 10/25/2015   TRIG 103.0 10/25/2015   CHOLHDL 3 10/25/2015    --------------------------------------------------------------------------------------------------  ASSESSMENT AND PLAN: 1. 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 However, the exertional chest tightness that he experienced before his last visit and abnormal stress test are concerning for obstructive coronary artery diseaseIn the setting of his multiple cardiac risk factors. Given the intermediate to  high risk stress test findings, I have recommended that we proceed with left heart catheterization. After discussing the procedure and its rationale with the patient, his wife, and their son (by phone), we have agreed to proceed with catheterization next week. I have reviewed the risks, indications, and alternatives to cardiac catheterization, possible angioplasty, and stenting with the patient. Risks include but are not limited to bleeding, infection, vascular injury, stroke, myocardial infection, arrhythmia, kidney injury, radiation-related injury in the case of prolonged fluoroscopy use, emergency cardiac surgery, and death. The patient understands the risks of serious complication is 1-2 in 5188 with diagnostic cardiac cath and 1-2% or less with angioplasty/stenting. In the meantime, we will continue his current medication regimen of aspirin, atorvastatin, and carvedilol. I provided the patient with  prescription for subungual nitroglycerin to be taken as needed for chest pain. If he has recurrent chest pain that does not resolve promptly with sublingual nitroglycerin or occurs at rest, the patient was advised to call 911.  Follow-up: Pending cardiac catheterization.  Nelva Bush, MD 11/11/2015 9:23 AM

## 2015-11-11 NOTE — Patient Instructions (Addendum)
Medication Instructions:  Your physician has recommended you make the following change in your medication:  START Nitro-glycerin as needed as directed. An Rx has been sent to your pharmacy   Labwork: Bmet, Cbc, pt/Inr today  Testing/Procedures: Your physician has requested that you have a cardiac catheterization. Cardiac catheterization is used to diagnose and/or treat various heart conditions. Doctors may recommend this procedure for a number of different reasons. The most common reason is to evaluate chest pain. Chest pain can be a symptom of coronary artery disease (CAD), and cardiac catheterization can show whether plaque is narrowing or blocking your heart's arteries. This procedure is also used to evaluate the valves, as well as measure the blood flow and oxygen levels in different parts of your heart. For further information please visit [email or web address]. Please follow instruction sheet, as given.    Follow-Up: Your physician recommends that you schedule a follow-up appointment  Pending procedure    Any Other Special Instructions Will Be Listed Below (If Applicable).     If you need a refill on your cardiac medications before your next appointment, please call your pharmacy.

## 2015-11-14 ENCOUNTER — Telehealth: Payer: Self-pay | Admitting: Pediatrics

## 2015-11-14 NOTE — Telephone Encounter (Signed)
Patient has questions in regards to pre cert for upcoming cath procedure. Procedure is sch for this Thursday 11/17/15. He is requesting a call back.

## 2015-11-17 ENCOUNTER — Encounter (HOSPITAL_COMMUNITY)
Admission: RE | Disposition: A | Discharge status: Home / Self Care | Payer: Self-pay | Source: Ambulatory Visit | Attending: Internal Medicine

## 2015-11-17 ENCOUNTER — Encounter (HOSPITAL_COMMUNITY): Payer: Self-pay | Admitting: Internal Medicine

## 2015-11-17 ENCOUNTER — Ambulatory Visit (HOSPITAL_COMMUNITY)
Admission: RE | Admit: 2015-11-17 | Discharge: 2015-11-18 | Disposition: A | Discharge status: Home / Self Care | Payer: Commercial Managed Care - HMO | Source: Ambulatory Visit | Attending: Internal Medicine | Admitting: Internal Medicine

## 2015-11-17 DIAGNOSIS — I208 Other forms of angina pectoris: Secondary | ICD-10-CM | POA: Insufficient documentation

## 2015-11-17 DIAGNOSIS — K219 Gastro-esophageal reflux disease without esophagitis: Secondary | ICD-10-CM | POA: Diagnosis not present

## 2015-11-17 DIAGNOSIS — Z87891 Personal history of nicotine dependence: Secondary | ICD-10-CM | POA: Diagnosis not present

## 2015-11-17 DIAGNOSIS — I25118 Atherosclerotic heart disease of native coronary artery with other forms of angina pectoris: Secondary | ICD-10-CM | POA: Diagnosis not present

## 2015-11-17 DIAGNOSIS — Z8249 Family history of ischemic heart disease and other diseases of the circulatory system: Secondary | ICD-10-CM | POA: Insufficient documentation

## 2015-11-17 DIAGNOSIS — Z823 Family history of stroke: Secondary | ICD-10-CM | POA: Insufficient documentation

## 2015-11-17 DIAGNOSIS — R9439 Abnormal result of other cardiovascular function study: Secondary | ICD-10-CM | POA: Diagnosis present

## 2015-11-17 DIAGNOSIS — Z91041 Radiographic dye allergy status: Secondary | ICD-10-CM | POA: Diagnosis not present

## 2015-11-17 DIAGNOSIS — Z7982 Long term (current) use of aspirin: Secondary | ICD-10-CM | POA: Diagnosis not present

## 2015-11-17 DIAGNOSIS — Z833 Family history of diabetes mellitus: Secondary | ICD-10-CM | POA: Insufficient documentation

## 2015-11-17 DIAGNOSIS — E785 Hyperlipidemia, unspecified: Secondary | ICD-10-CM | POA: Insufficient documentation

## 2015-11-17 DIAGNOSIS — I2584 Coronary atherosclerosis due to calcified coronary lesion: Secondary | ICD-10-CM | POA: Diagnosis not present

## 2015-11-17 DIAGNOSIS — I2510 Atherosclerotic heart disease of native coronary artery without angina pectoris: Secondary | ICD-10-CM

## 2015-11-17 DIAGNOSIS — Z7984 Long term (current) use of oral hypoglycemic drugs: Secondary | ICD-10-CM | POA: Diagnosis not present

## 2015-11-17 DIAGNOSIS — E119 Type 2 diabetes mellitus without complications: Secondary | ICD-10-CM | POA: Insufficient documentation

## 2015-11-17 DIAGNOSIS — Z955 Presence of coronary angioplasty implant and graft: Secondary | ICD-10-CM

## 2015-11-17 DIAGNOSIS — E1165 Type 2 diabetes mellitus with hyperglycemia: Secondary | ICD-10-CM

## 2015-11-17 DIAGNOSIS — I209 Angina pectoris, unspecified: Secondary | ICD-10-CM | POA: Diagnosis present

## 2015-11-17 DIAGNOSIS — I10 Essential (primary) hypertension: Secondary | ICD-10-CM | POA: Diagnosis not present

## 2015-11-17 DIAGNOSIS — IMO0001 Reserved for inherently not codable concepts without codable children: Secondary | ICD-10-CM

## 2015-11-17 HISTORY — PX: CARDIAC CATHETERIZATION: SHX172

## 2015-11-17 HISTORY — DX: Atherosclerotic heart disease of native coronary artery without angina pectoris: I25.10

## 2015-11-17 HISTORY — DX: Pneumonia, unspecified organism: J18.9

## 2015-11-17 HISTORY — DX: Radiographic dye allergy status: Z91.041

## 2015-11-17 HISTORY — DX: Type 2 diabetes mellitus without complications: E11.9

## 2015-11-17 LAB — GLUCOSE, CAPILLARY
GLUCOSE-CAPILLARY: 131 mg/dL — AB (ref 65–99)
GLUCOSE-CAPILLARY: 174 mg/dL — AB (ref 65–99)
Glucose-Capillary: 118 mg/dL — ABNORMAL HIGH (ref 65–99)
Glucose-Capillary: 120 mg/dL — ABNORMAL HIGH (ref 65–99)

## 2015-11-17 LAB — CBC
HCT: 34.9 % — ABNORMAL LOW (ref 39.0–52.0)
Hemoglobin: 12.1 g/dL — ABNORMAL LOW (ref 13.0–17.0)
MCH: 29.7 pg (ref 26.0–34.0)
MCHC: 34.7 g/dL (ref 30.0–36.0)
MCV: 85.5 fL (ref 78.0–100.0)
PLATELETS: 153 10*3/uL (ref 150–400)
RBC: 4.08 MIL/uL — AB (ref 4.22–5.81)
RDW: 12.5 % (ref 11.5–15.5)
WBC: 7.9 10*3/uL (ref 4.0–10.5)

## 2015-11-17 LAB — CREATININE, SERUM
CREATININE: 0.71 mg/dL (ref 0.61–1.24)
GFR calc Af Amer: >60 mL/min (ref >60)
GFR calc non Af Amer: >60 mL/min (ref >60)

## 2015-11-17 LAB — POCT ACTIVATED CLOTTING TIME
Activated Clotting Time: 252 seconds
Activated Clotting Time: 290 seconds
Activated Clotting Time: 335 seconds

## 2015-11-17 SURGERY — LEFT HEART CATH AND CORONARY ANGIOGRAPHY

## 2015-11-17 MED ORDER — CARVEDILOL 12.5 MG PO TABS
12.5000 mg | ORAL_TABLET | Freq: Two times a day (BID) | ORAL | Status: DC
  Administered 2015-11-17 – 2015-11-18 (×2): 12.5 mg via ORAL
  Filled 2015-11-17 (×2): qty 1

## 2015-11-17 MED ORDER — DIPHENHYDRAMINE HCL 50 MG/ML IJ SOLN
25.0000 mg | Freq: Once | INTRAMUSCULAR | Status: AC
  Administered 2015-11-17: 25 mg via INTRAVENOUS
  Filled 2015-11-17: qty 1

## 2015-11-17 MED ORDER — LIDOCAINE HCL (PF) 1 % IJ SOLN
INTRAMUSCULAR | Status: DC | PRN
  Administered 2015-11-17: 2 mL via SUBCUTANEOUS

## 2015-11-17 MED ORDER — SODIUM CHLORIDE 0.9% FLUSH: 3.0000 mL | INTRAVENOUS | Status: DC | PRN

## 2015-11-17 MED ORDER — SODIUM CHLORIDE 0.9 % IV SOLN: 250.0000 mL | INTRAVENOUS | Status: DC | PRN

## 2015-11-17 MED ORDER — ONDANSETRON HCL 4 MG/2ML IJ SOLN: 4.0000 mg | Freq: Four times a day (QID) | INTRAMUSCULAR | Status: DC | PRN

## 2015-11-17 MED ORDER — ASPIRIN 81 MG PO CHEW
81.0000 mg | CHEWABLE_TABLET | Freq: Every day | ORAL | Status: DC
  Administered 2015-11-18: 08:00:00 81 mg via ORAL
  Filled 2015-11-17: qty 1

## 2015-11-17 MED ORDER — HEPARIN SODIUM (PORCINE) 1000 UNIT/ML IJ SOLN
INTRAMUSCULAR | Status: DC | PRN
  Administered 2015-11-17: 2000 [IU] via INTRAVENOUS
  Administered 2015-11-17: 1000 [IU] via INTRAVENOUS
  Administered 2015-11-17: 2000 [IU] via INTRAVENOUS
  Administered 2015-11-17 (×2): 3500 [IU] via INTRAVENOUS

## 2015-11-17 MED ORDER — SODIUM CHLORIDE 0.9 % WEIGHT BASED INFUSION: 1.0000 mL/kg/h | INTRAVENOUS | Status: DC

## 2015-11-17 MED ORDER — ATORVASTATIN CALCIUM 20 MG PO TABS
20.0000 mg | ORAL_TABLET | Freq: Every day | ORAL | Status: DC
  Administered 2015-11-17: 19:00:00 20 mg via ORAL
  Filled 2015-11-17: qty 1

## 2015-11-17 MED ORDER — HEPARIN SODIUM (PORCINE) 1000 UNIT/ML IJ SOLN
INTRAMUSCULAR | Status: AC
  Filled 2015-11-17: qty 1

## 2015-11-17 MED ORDER — HEPARIN (PORCINE) IN NACL 2-0.9 UNIT/ML-% IJ SOLN
INTRAMUSCULAR | Status: DC | PRN
  Administered 2015-11-17: 1500 mL

## 2015-11-17 MED ORDER — ENOXAPARIN SODIUM 40 MG/0.4ML ~~LOC~~ SOLN: 40.0000 mg | SUBCUTANEOUS | Status: DC

## 2015-11-17 MED ORDER — FENTANYL CITRATE (PF) 100 MCG/2ML IJ SOLN
INTRAMUSCULAR | Status: AC
  Filled 2015-11-17: qty 2

## 2015-11-17 MED ORDER — SODIUM CHLORIDE 0.9 % IV SOLN
INTRAVENOUS | Status: AC
  Administered 2015-11-17: 11:00:00 via INTRAVENOUS

## 2015-11-17 MED ORDER — IOPAMIDOL (ISOVUE-370) INJECTION 76%
INTRAVENOUS | Status: AC
  Filled 2015-11-17: qty 100

## 2015-11-17 MED ORDER — SODIUM CHLORIDE 0.9% FLUSH: 3.0000 mL | Freq: Two times a day (BID) | INTRAVENOUS | Status: DC

## 2015-11-17 MED ORDER — ATORVASTATIN CALCIUM 20 MG PO TABS: 20.0000 mg | ORAL_TABLET | Freq: Every day | ORAL | Status: DC

## 2015-11-17 MED ORDER — IOPAMIDOL (ISOVUE-370) INJECTION 76%
INTRAVENOUS | Status: DC | PRN
  Administered 2015-11-17: 235 mL via INTRA_ARTERIAL

## 2015-11-17 MED ORDER — INSULIN ASPART 100 UNIT/ML ~~LOC~~ SOLN: 0.0000 [IU] | SUBCUTANEOUS | Status: DC

## 2015-11-17 MED ORDER — TICAGRELOR 90 MG PO TABS
90.0000 mg | ORAL_TABLET | Freq: Two times a day (BID) | ORAL | Status: DC
  Administered 2015-11-17 – 2015-11-18 (×2): 90 mg via ORAL
  Filled 2015-11-17 (×2): qty 1

## 2015-11-17 MED ORDER — MIDAZOLAM HCL 2 MG/2ML IJ SOLN
INTRAMUSCULAR | Status: AC
  Filled 2015-11-17: qty 2

## 2015-11-17 MED ORDER — FENTANYL CITRATE (PF) 100 MCG/2ML IJ SOLN
INTRAMUSCULAR | Status: DC | PRN
  Administered 2015-11-17: 50 ug via INTRAVENOUS

## 2015-11-17 MED ORDER — MIDAZOLAM HCL 2 MG/2ML IJ SOLN
INTRAMUSCULAR | Status: DC | PRN
  Administered 2015-11-17: 1 mg via INTRAVENOUS

## 2015-11-17 MED ORDER — HEPARIN (PORCINE) IN NACL 2-0.9 UNIT/ML-% IJ SOLN
INTRAMUSCULAR | Status: AC
  Filled 2015-11-17: qty 1500

## 2015-11-17 MED ORDER — LIDOCAINE HCL (PF) 1 % IJ SOLN
INTRAMUSCULAR | Status: AC
  Filled 2015-11-17: qty 30

## 2015-11-17 MED ORDER — NITROGLYCERIN 1 MG/10 ML FOR IR/CATH LAB
INTRA_ARTERIAL | Status: DC | PRN
  Administered 2015-11-17 (×3): 200 ug via INTRACORONARY

## 2015-11-17 MED ORDER — TICAGRELOR 90 MG PO TABS
ORAL_TABLET | ORAL | Status: AC
Start: 2015-11-17 — End: 2015-11-17
  Filled 2015-11-17: qty 2

## 2015-11-17 MED ORDER — NITROGLYCERIN 1 MG/10 ML FOR IR/CATH LAB
INTRA_ARTERIAL | Status: AC
  Filled 2015-11-17: qty 10

## 2015-11-17 MED ORDER — ACETAMINOPHEN 325 MG PO TABS: 650.0000 mg | ORAL_TABLET | ORAL | Status: DC | PRN

## 2015-11-17 MED ORDER — VERAPAMIL HCL 2.5 MG/ML IV SOLN
INTRAVENOUS | Status: AC
  Filled 2015-11-17: qty 2

## 2015-11-17 MED ORDER — VERAPAMIL HCL 2.5 MG/ML IV SOLN
INTRAVENOUS | Status: DC | PRN
  Administered 2015-11-17: 10 mL via INTRA_ARTERIAL

## 2015-11-17 MED ORDER — SODIUM CHLORIDE 0.9 % WEIGHT BASED INFUSION
3.0000 mL/kg/h | INTRAVENOUS | Status: DC
  Administered 2015-11-17: 3 mL/kg/h via INTRAVENOUS

## 2015-11-17 MED ORDER — ASPIRIN 81 MG PO CHEW: 81.0000 mg | CHEWABLE_TABLET | ORAL | Status: DC

## 2015-11-17 MED ORDER — ATORVASTATIN CALCIUM 10 MG PO TABS: 10.0000 mg | ORAL_TABLET | Freq: Every day | ORAL | Status: DC

## 2015-11-17 MED ORDER — NITROGLYCERIN 0.4 MG SL SUBL: 0.4000 mg | SUBLINGUAL_TABLET | SUBLINGUAL | Status: DC | PRN

## 2015-11-17 MED ORDER — TICAGRELOR 90 MG PO TABS
ORAL_TABLET | ORAL | Status: DC | PRN
  Administered 2015-11-17: 180 mg via ORAL

## 2015-11-17 MED ORDER — IOPAMIDOL (ISOVUE-370) INJECTION 76%
INTRAVENOUS | Status: AC
  Filled 2015-11-17: qty 50

## 2015-11-17 MED ORDER — ANGIOPLASTY BOOK
Freq: Once | Status: AC
  Administered 2015-11-17: 23:00:00
  Filled 2015-11-17: qty 1

## 2015-11-17 SURGICAL SUPPLY — 23 items
BALLN ANGIOSCULPT RX 2.5X10 (BALLOONS) ×2
BALLN EUPHORA RX 2.0X15 (BALLOONS) ×2
BALLN ~~LOC~~ EUPHORA RX 2.25X8 (BALLOONS) ×2
BALLN ~~LOC~~ EUPHORA RX 2.75X15 (BALLOONS) ×2
BALLOON ANGIOSCULPT RX 2.5X10 (BALLOONS) ×1 IMPLANT
BALLOON EUPHORA RX 2.0X15 (BALLOONS) ×1 IMPLANT
BALLOON ~~LOC~~ EUPHORA RX 2.25X8 (BALLOONS) ×1 IMPLANT
BALLOON ~~LOC~~ EUPHORA RX 2.75X15 (BALLOONS) ×1 IMPLANT
CATH 5FR JL3.5 JR4 ANG PIG MP (CATHETERS) ×2 IMPLANT
CATH VISTA GUIDE 6FR XB3.5 (CATHETERS) ×2 IMPLANT
DEVICE RAD COMP TR BAND LRG (VASCULAR PRODUCTS) ×2 IMPLANT
GLIDESHEATH SLEND SS 6F .021 (SHEATH) ×2 IMPLANT
KIT ENCORE 26 ADVANTAGE (KITS) ×2 IMPLANT
KIT HEART LEFT (KITS) ×2 IMPLANT
PACK CARDIAC CATHETERIZATION (CUSTOM PROCEDURE TRAY) ×2 IMPLANT
STENT RESOLUTE INTEG 2.5X30 (Permanent Stent) ×2 IMPLANT
SYR MEDRAD MARK V 150ML (SYRINGE) ×2 IMPLANT
TRANSDUCER W/STOPCOCK (MISCELLANEOUS) ×2 IMPLANT
TUBING CIL FLEX 10 FLL-RA (TUBING) ×2 IMPLANT
WIRE ASAHI PROWATER 180CM (WIRE) ×2 IMPLANT
WIRE HI TORQ VERSACORE-J 145CM (WIRE) ×2 IMPLANT
WIRE RUNTHROUGH .014X180CM (WIRE) ×2 IMPLANT
WIRE SAFE-T 1.5MM-J .035X260CM (WIRE) ×2 IMPLANT

## 2015-11-17 NOTE — Care Management Note (Addendum)
Case Management Note  Patient Details  Name: Eddie Mcdaniel MRN: [id number] Date of Birth: [birth year]  Subjective/Objective:    Chest pain, abnormal stress test                Action/Plan: Discharge Planning: Provided pt with Brilinta 30 day free trial card. Will checks benefits. Contacted [facility] and they do have medication in stock. Brilinta copay for 90 day supply is $141.00.    PCP - Ann Held MD  Expected Discharge Date:  11/18/2015                Expected Discharge Plan:  Home/Self Care  In-House Referral:  NA  Discharge planning Services  CM Consult, Medication Assistance  Post Acute Care Choice:  NA Choice offered to:  NA  DME Arranged:  N/A DME Agency:  NA  HH Arranged:  NA HH Agency:  NA  Status of Service:  In process, will continue to follow  If discussed at Long Length of Stay Meetings, dates discussed:    Additional Comments:  Erenest Rasher, RN 11/17/2015, 5:07 PM

## 2015-11-17 NOTE — Interval H&P Note (Signed)
History and Physical Interval Note:  11/17/2015 8:52 AM  Eddie Mcdaniel  has presented today for cardiac catheterization, with the diagnosis of chest pain and abnormal stress test.  The various methods of treatment have been discussed with the patient and family. After consideration of risks, benefits and other options for treatment, the patient has consented to  Procedure(s): Left Heart Cath and Coronary Angiography (N/A) as a surgical intervention .  The patient's history has been reviewed, patient examined, no change in status, stable for surgery.  I have reviewed the patient's chart and labs.  Questions were answered to the patient's satisfaction.    Cath Lab Visit (complete for each Cath Lab visit)  Clinical Evaluation Leading to the Procedure:   ACS: No.  Non-ACS:    Anginal Classification: CCS II  Anti-ischemic medical therapy: Minimal Therapy (1 class of medications)  Non-Invasive Test Results: High-risk stress test findings: cardiac mortality >3%/year  Prior CABG: No previous CABG  Lorraine Terriquez

## 2015-11-17 NOTE — Research (Signed)
CAD LAD Informed Consent   Subject Name: Eddie Mcdaniel  Subject met inclusion and exclusion criteria.  The informed consent form, study requirements and expectations were reviewed with the subject and questions and concerns were addressed prior to the signing of the consent form.  The subject verbalized understanding of the trail requirements.  The subject agreed to participate in the CAD LAD trial and signed the informed consent.  The informed consent was obtained prior to performance of any protocol-specific procedures for the subject.  A copy of the signed informed consent was given to the subject and a copy was placed in the subject's medical record.  Hedrick,Yunis Voorheis W 11/17/2015, 0820

## 2015-11-17 NOTE — H&P (View-Only) (Signed)
Follow-up Outpatient Visit Date: 11/11/2015  Chief Complaint: chest pain and abnormal stress test  HPI:  [name] is a 75 y.o. year-old male with history of type 2 diabetes mellitus, hypertension, hyperlipidemia, and GERD, who presents for follow-up of chest pain. He was evaluated for the first time on 10/28/15 by Nell Range and Dr. Harrington Challenger, which time he reported chest tightness radiating to the left arm. EKG was notable for anterior T-wave inversion, prompting referral for myocardial perfusion stress test. Perfusion imaging demonstrated a partially reversible apical defect. LVEF was also mildly reduced. Since his last visit with [place], the patient has not had any further episodes of chest tightness. It should be noted that he has not been walking regularly though. Further characterization of his previous chest pain includes tightness across the precordium when walking, with radiation to the left arm. The pain would typically subside with 5 minutes of rest. Interestingly, this would only occur when walking in the evening after dinner. He did not have symptoms when going for walks earlier in the day. He denies accompanied symptoms such as shortness of breath, nausea, and diaphoresis. He has not had any significant bleeding. He does not have upcoming surgeries. He denies prior stroke. He is planning to travel to [place] in early November, and will be staying there until early 2018.  --------------------------------------------------------------------------------------------------  Cardiovascular History & Procedures: Cardiovascular Problems:  Chest pain and abnormal stress test  Risk Factors:  Male gender, family history, age, diabetes mellitus, hypertension, and hyperlipidemia  Cath/PCI:  None.  CV Surgery:  None.  EP Procedures and Devices:  None.  Non-Invasive Evaluation(s):  Exercise myocardial perfusion stress test (11/08/15): Small in size, severe partially reversible defect  involving the apical lateral and apical segments. LVEF mildly reduced with apical hypokinesis. The patient exercised for 7 minutes and 15 seconds achieving stage III (8.9 minutes) with peak heart rate of 131 bpm. EKG demonstrated ST depressions in V5 and V6.  Transthoracic echocardiogram (10/28/15): Normal LV size with mild LVH. LVEF 60-65% with grade 1 diastolic dysfunction. Trivial aortic regurgitation noted. Mildly increased pulmonary artery pressure. Normal RV size and function.  Recent CV Pertinent Labs: Lab Results  Component Value Date   CHOL 97 10/25/2015   HDL 31.30 (L) 10/25/2015   LDLCALC 45 10/25/2015   TRIG 103.0 10/25/2015   CHOLHDL 3 10/25/2015   K 4.8 10/25/2015   BUN 10 10/25/2015   CREATININE 0.74 10/25/2015   CREATININE 1.15 10/18/2010    Past medical and surgical history were reviewed and updated in EPIC.   Outpatient Encounter Prescriptions as of 11/11/2015  Medication Sig  . aspirin 81 MG chewable tablet Chew 81 mg by mouth daily.    Marland Kitchen atorvastatin (LIPITOR) 10 MG tablet Take 1 tablet (10 mg total) by mouth daily.  . Blood Glucose Monitoring Suppl (TRUE METRIX AIR GLUCOSE METER) W/DEVICE KIT 1 Device by Does not apply route daily. Check blood sugar twice daily. Dx. E11.9  . carvedilol (COREG) 12.5 MG tablet Take 1 tablet (12.5 mg total) by mouth 2 (two) times daily with a meal.  . CINNAMON PO Take by mouth daily.   . metFORMIN (GLUCOPHAGE-XR) 750 MG 24 hr tablet Take 1 tablet (750 mg total) by mouth 2 (two) times daily.  . ONE TOUCH ULTRA TEST test strip Check blood sugar once a day. Dx 250.00  . TRUE METRIX BLOOD GLUCOSE TEST test strip Check blood sugar twice daily. Dx. E11.9  . TRUEPLUS LANCETS 33G MISC Check blood sugar twice  daily. Dx. E11.9  . Turmeric POWD Take by mouth daily.   Facility-Administered Encounter Medications as of 11/11/2015  Medication  . 0.9 %  sodium chloride infusion  . dextrose 5 % solution    Allergies: Ace inhibitors  Social  History   Social History  . Marital status: Married    Spouse name: N/A  . Number of children: N/A  . Years of education: N/A   Occupational History  . retired    Social History Main Topics  . Smoking status: Never Smoker  . Smokeless tobacco: Former Systems developer    Types: Chew    Quit date: 04/30/2010  . Alcohol use No  . Drug use: No  . Sexual activity: Yes    Partners: Female   Other Topics Concern  . Not on file   Social History Narrative   Walking daily   yoga          Family History  Problem Relation Age of Onset  . Diabetes Mother   . Hypertension Mother   . Cancer Brother 54    brain, stomach  . Hyperlipidemia Brother   . Heart disease Brother     MI  . Heart disease Brother     MI  . Diabetes Brother   . Stroke Brother   . Hypertension Brother   . Colon cancer Neg Hx   . Esophageal cancer Neg Hx   . Rectal cancer Neg Hx   . Stomach cancer Neg Hx     Review of Systems: A 12-system review of systems was performed and was negative except as noted in the HPI.  --------------------------------------------------------------------------------------------------  Physical Exam: BP 132/84   Pulse (!) 57   Ht 5' 4.5" (1.638 m)   Wt 147 lb 12.8 oz (67 kg)   BMI 24.98 kg/m   General:  Well-developed, well-nourished man, seated comfortably in the exam room. He is accompanied by his wife. Neck: Supple without lymphadenopathy, thyromegaly, JVD, or HJR. Lungs: Normal work of breathing.  Clear to auscultation bilaterally without wheezes or crackles. Heart: Regular rate and rhythm without murmurs, rubs, or gallops.  Non-displaced PMI. Abd: Bowel sounds present.  Soft, NT/ND without hepatosplenomegaly Ext: No lower extremity edema.  Radial, PT, and DP pulses are 2+ bilaterally. Skin: warm and dry without rash  EKG:  Sinus bradycardia (heart rate 57 bpm) with anterior T waves. No significant change from prior tracing on 10/25/15.  Lab Results  Component Value Date     WBC 8.5 10/25/2015   HGB 13.2 10/25/2015   HCT 38.8 (L) 10/25/2015   MCV 88.3 10/25/2015   PLT 154.0 10/25/2015    Lab Results  Component Value Date   NA 137 10/25/2015   K 4.8 10/25/2015   CL 102 10/25/2015   CO2 30 10/25/2015   BUN 10 10/25/2015   CREATININE 0.74 10/25/2015   GLUCOSE 100 (H) 10/25/2015   ALT 15 10/25/2015    Lab Results  Component Value Date   CHOL 97 10/25/2015   HDL 31.30 (L) 10/25/2015   LDLCALC 45 10/25/2015   TRIG 103.0 10/25/2015   CHOLHDL 3 10/25/2015    --------------------------------------------------------------------------------------------------  ASSESSMENT AND PLAN: 1. 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 However, the exertional chest tightness that he experienced before his last visit and abnormal stress test are concerning for obstructive coronary artery diseaseIn the setting of his multiple cardiac risk factors. Given the intermediate to  high risk stress test findings, I have recommended that we proceed with left heart catheterization. After discussing the procedure and its rationale with the patient, his wife, and their son (by phone), we have agreed to proceed with catheterization next week. I have reviewed the risks, indications, and alternatives to cardiac catheterization, possible angioplasty, and stenting with the patient. Risks include but are not limited to bleeding, infection, vascular injury, stroke, myocardial infection, arrhythmia, kidney injury, radiation-related injury in the case of prolonged fluoroscopy use, emergency cardiac surgery, and death. The patient understands the risks of serious complication is 1-2 in 5188 with diagnostic cardiac cath and 1-2% or less with angioplasty/stenting. In the meantime, we will continue his current medication regimen of aspirin, atorvastatin, and carvedilol. I provided the patient with  prescription for subungual nitroglycerin to be taken as needed for chest pain. If he has recurrent chest pain that does not resolve promptly with sublingual nitroglycerin or occurs at rest, the patient was advised to call 911.  Follow-up: Pending cardiac catheterization.  Nelva Bush, MD 11/11/2015 9:23 AM

## 2015-11-17 NOTE — Progress Notes (Signed)
Right radial site bruised distal to site on admission to unit. Noted swelling in hand and increased elevation while waiting to deflate. Frequent checks noted that patient had some numbness, but capillary refill remained brisk. Noted decrease in swelling after bad deflated. Bruising and some swelling in hand remained but much better than before deflation. Dr. Saunders Revel called to assess and came to see patient and assessed site. Orders to continue elevation at this time.

## 2015-11-18 ENCOUNTER — Encounter (HOSPITAL_COMMUNITY): Payer: Self-pay | Admitting: Physician Assistant

## 2015-11-18 DIAGNOSIS — Z91041 Radiographic dye allergy status: Secondary | ICD-10-CM | POA: Diagnosis not present

## 2015-11-18 DIAGNOSIS — E1159 Type 2 diabetes mellitus with other circulatory complications: Secondary | ICD-10-CM | POA: Diagnosis not present

## 2015-11-18 DIAGNOSIS — I2584 Coronary atherosclerosis due to calcified coronary lesion: Secondary | ICD-10-CM | POA: Diagnosis not present

## 2015-11-18 DIAGNOSIS — I10 Essential (primary) hypertension: Secondary | ICD-10-CM | POA: Diagnosis not present

## 2015-11-18 DIAGNOSIS — I2510 Atherosclerotic heart disease of native coronary artery without angina pectoris: Secondary | ICD-10-CM

## 2015-11-18 DIAGNOSIS — I209 Angina pectoris, unspecified: Secondary | ICD-10-CM

## 2015-11-18 DIAGNOSIS — R9439 Abnormal result of other cardiovascular function study: Secondary | ICD-10-CM

## 2015-11-18 DIAGNOSIS — E782 Mixed hyperlipidemia: Secondary | ICD-10-CM

## 2015-11-18 DIAGNOSIS — I25118 Atherosclerotic heart disease of native coronary artery with other forms of angina pectoris: Secondary | ICD-10-CM | POA: Diagnosis not present

## 2015-11-18 DIAGNOSIS — E119 Type 2 diabetes mellitus without complications: Secondary | ICD-10-CM | POA: Diagnosis not present

## 2015-11-18 LAB — BASIC METABOLIC PANEL
Anion gap: 5 (ref 5–15)
BUN: 6 mg/dL (ref 6–20)
CALCIUM: 8.9 mg/dL (ref 8.9–10.3)
CO2: 25 mmol/L (ref 22–32)
CREATININE: 0.71 mg/dL (ref 0.61–1.24)
Chloride: 105 mmol/L (ref 101–111)
Glucose, Bld: 114 mg/dL — ABNORMAL HIGH (ref 65–99)
Potassium: 4 mmol/L (ref 3.5–5.1)
SODIUM: 135 mmol/L (ref 135–145)

## 2015-11-18 LAB — CBC
HCT: 36 % — ABNORMAL LOW (ref 39.0–52.0)
Hemoglobin: 12.1 g/dL — ABNORMAL LOW (ref 13.0–17.0)
MCH: 29.3 pg (ref 26.0–34.0)
MCHC: 33.6 g/dL (ref 30.0–36.0)
MCV: 87.2 fL (ref 78.0–100.0)
PLATELETS: 141 10*3/uL — AB (ref 150–400)
RBC: 4.13 MIL/uL — AB (ref 4.22–5.81)
RDW: 12.7 % (ref 11.5–15.5)
WBC: 8.6 10*3/uL (ref 4.0–10.5)

## 2015-11-18 LAB — GLUCOSE, CAPILLARY: GLUCOSE-CAPILLARY: 105 mg/dL — AB (ref 65–99)

## 2015-11-18 MED ORDER — TICAGRELOR 90 MG PO TABS: 90.0000 mg | ORAL_TABLET | Freq: Two times a day (BID) | ORAL | 3 refills | Status: DC

## 2015-11-18 MED ORDER — ASPIRIN EC 81 MG PO TBEC: 81.0000 mg | DELAYED_RELEASE_TABLET | Freq: Every day | ORAL | 3 refills | Status: DC

## 2015-11-18 MED ORDER — ATORVASTATIN CALCIUM 20 MG PO TABS: 20.0000 mg | ORAL_TABLET | Freq: Every evening | ORAL | 6 refills | Status: DC

## 2015-11-18 MED ORDER — AMLODIPINE BESYLATE 5 MG PO TABS: 2.5000 mg | ORAL_TABLET | Freq: Once | ORAL | Status: DC

## 2015-11-18 MED ORDER — AMLODIPINE BESYLATE 5 MG PO TABS: 5.0000 mg | ORAL_TABLET | Freq: Every day | ORAL | 6 refills | Status: DC

## 2015-11-18 MED ORDER — AMLODIPINE BESYLATE 2.5 MG PO TABS: 2.5000 mg | ORAL_TABLET | Freq: Every day | ORAL | 6 refills | Status: DC

## 2015-11-18 MED ORDER — AMLODIPINE BESYLATE 5 MG PO TABS: 2.5000 mg | ORAL_TABLET | Freq: Every day | ORAL | Status: DC

## 2015-11-18 MED ORDER — AMLODIPINE BESYLATE 5 MG PO TABS
2.5000 mg | ORAL_TABLET | Freq: Every day | ORAL | Status: DC
  Administered 2015-11-18: 2.5 mg via ORAL
  Filled 2015-11-18: qty 1

## 2015-11-18 MED ORDER — AMLODIPINE BESYLATE 5 MG PO TABS: 5.0000 mg | ORAL_TABLET | Freq: Every day | ORAL | Status: DC

## 2015-11-18 NOTE — Progress Notes (Deleted)
Error, see dc summary.

## 2015-11-18 NOTE — Progress Notes (Signed)
CARDIAC REHAB PHASE I   PRE:  Rate/Rhythm: 79 SR  BP:  Sitting: 196/90        SaO2: 99 RA  MODE:  Ambulation: 800 ft   POST:  Rate/Rhythm: 75 SR  BP:  Sitting: 178/73         SaO2: 100 RA  Pt ambulated 800 ft on RA, handheld assist, steady gait, tolerated well with no complaints. Completed PCI/stent education with pt, pt's wife and son at bedside.  Reviewed risk factors, PCI book, anti-platelet therapy, stent card, activity restrictions, ntg, exercise, heart healthy diet, carb counting, portion control, and phase 2 cardiac rehab. Pt verbalized understanding, receptive to education. Pt agrees to phase 2 cardiac rehab referral, will send to [facility] per pt request. Pt to recliner after walk, call bell within reach.    [place], RN, BSN 11/18/2015 9:42 AM

## 2015-11-18 NOTE — Significant Event (Signed)
Patient reported itching yesterday evening, several hours after catheterization.  No rash was noted, but itching progressed from neck to chest and face.  Symptoms improved after receiving Benadryl.  As this could represent delayed reaction to contrast from catheterization, I will enter contrast dye as an allergy in the patient's chart.  Nelva Bush, MD [facility] Pager: [phone]

## 2015-11-18 NOTE — Discharge Summary (Addendum)
Discharge Summary    Patient ID: Eddie Mcdaniel,  MRN: [id number], DOB/AGE: 73-May-1942 73 y.o.  Admit date: 11/17/2015 Discharge date: 11/18/2015  Primary Care Provider: Ann Held Primary Cardiologist: Dr. Harrington Challenger  Discharge Diagnoses    Principal Problem:   Angina pectoris [facility]) Active Problems:   CAD in native artery   Diabetes mellitus type II, controlled ([place])   Hyperlipidemia   Essential hypertension   Abnormal stress test   Contrast media allergy    Diagnostic Studies/Procedures    1. Cardiac catheterization this admission, please see full report and below for summary. _____________   History of Present Illness & Hospital Course    Eddie Mcdaniel is a 89M with type 2 diabetes mellitus, hypertension, hyperlipidemia, and GERD admitted for planned cath due to recent angina pectoris and abnormal stress test. LHC 11/18/15 showed diffusely disease LAD s/p DES to LAD, mod-severe ostial diagonal disease with tiny diag jailed by stent - given small size, PCI not performed, mod nonobstructive mRCA disease, EF 55-65%. He had itching post-cath which was relieved by Benadryl, felt to represent a delayed contrast media allergy. He had no respiratory difficulty or apparent rash. This has resolved this morning. The patient was instructed in the future to notify his doctor or team if he requires any further procedures with contrast dye as he will require pre-medication. He ambulated with cardiac rehab without complication. His blood pressure was elevated this morning up to 505-697 systolic so amlodipine 9.4IA was added - we initially had plans to increase to 46m daily but blood pressure came down nicely to 120/60 with 2.532mof amlodipine. ([facility].) He was started on Brilinta - was given 30 day free rx and regular refills. He was informed he did not have to chew his aspirin, that he could take it whole, since he is on Brilinta. Dr. EnSaunders Revelitrated  his atorvastatin from 10 to 204maily with recommendation to consider escalating further in follow-up. Recent LDL was 65. Dr. EndSaunders Revelso had recommended could consider switching Brilinta to Plavix in outpatient setting if needed. The patient was asked to hold Metformin 48 hr post-cath, to resume the evening of 11/19/15. Dr. VarIrish Lacks seen and examined the patient today and feels he is stable for discharge, pending downtrend in blood pressure with new addition of amlodipine. _____________  Discharge Vitals Blood pressure 120/60, pulse 63, temperature 97.5 F (36.4 C), temperature source Oral, resp. rate 12, height _0  (1.676 m), weight 142 lb 10.2 oz (64.7 kg), SpO2 100 %.   BP (!) 178/73   Pulse 63   Temp 97.5 F (36.4 C) (Oral)   Resp (!) 21   Ht _1  (1.676 m)   Wt 142 lb 10.2 oz (64.7 kg)   SpO2 100%   BMI 23.02 kg/m  General: Well developed, well nourished [place] in no acute distress. HEENT: Normocephalic, atraumatic, sclera non-icteric, no xanthomas, nares are without discharge. Neck: Supple. JVP not elevated. Lungs: Clear bilaterally to auscultation without wheezes, rales, or rhonchi. Breathing is unlabored. Cardiac: RRR S1 S2 without murmurs, rubs, or gallops.  Abdomen: Soft, non-tender. Extremities: No clubbing or cyanosis. No edema. Distal pedal pulses are 2+ and equal bilaterally. Skin: Warm and dry, no significant rash. Right radial cath site without hematoma or ecchymosis; good pulse. Neuro: Alert and oriented X 3. Strength and sensation in tact. Psych:  Responds to questions appropriately with a normal affect.  Filed Weights  11/17/15 0703 11/18/15 0440  Weight: 147 lb (66.7 kg) 142 lb 10.2 oz (64.7 kg)    Labs & Radiologic Studies    CBC  Recent Labs  11/17/15 1215 11/18/15 0557  WBC 7.9 8.6  HGB 12.1* 12.1*  HCT 34.9* 36.0*  MCV 85.5 87.2  PLT 153 681*   Basic Metabolic Panel  Recent Labs  11/17/15 1215 11/18/15 0557  NA  --  135  K  --   4.0  CL  --  105  CO2  --  25  GLUCOSE  --  114*  BUN  --  6  CREATININE 0.71 0.71  CALCIUM  --  8.9  _____________  No results found. Disposition   Pt is being discharged home today in good condition.  Follow-up Plans & Appointments    Follow-up Information    Angelena Form, PA-C .   Specialties:  Cardiology, Radiology Why:  Follow-up with Nell Range, PA-C as below on 11/30/15. Contact information: [place] [address] [phone]          Discharge Instructions    Amb Referral to Cardiac Rehabilitation    Complete by:  As directed    Diagnosis:  Coronary Stents   Diet - low sodium heart healthy    Complete by:  As directed    Increase activity slowly    Complete by:  As directed    IMPORTANT: Do not restart your metformin until evening of 11/19/15 (needs to be held 48 hours after cardiac catheterization).  Your atorvastatin dose has changed (new tablet size). You were started on a new blood pressure medicine called amlodipine and a new blood thinner called Brilinta. You do not have to chew your aspirin. You can take it whole.  No driving for 2 days. No lifting over 5 lbs for 1 week. No sexual activity for 1 week. Keep procedure site clean & dry. If you notice increased pain, swelling, bleeding or pus, call/return!  You may shower, but no soaking baths/hot tubs/pools for 1 week.      Discharge Medications     Medication List    STOP taking these medications   aspirin 81 MG chewable tablet Replaced by:  aspirin EC 81 MG tablet     TAKE these medications   amLODipine 2.5 MG tablet Commonly known as:  NORVASC Take 1 tablet (2.5 mg total) by mouth daily. Start taking on:  11/19/2015   aspirin EC 81 MG tablet Take 1 tablet (81 mg total) by mouth daily. Replaces:  aspirin 81 MG chewable tablet   atorvastatin 20 MG tablet Commonly known as:  LIPITOR Take 1 tablet (20 mg total) by mouth every evening. What  changed:  medication strength  how much to take  when to take this   carvedilol 12.5 MG tablet Commonly known as:  COREG Take 1 tablet (12.5 mg total) by mouth 2 (two) times daily with a meal.   CINNAMON PO Take by mouth See admin instructions. Takes "half a spoonful" once a day.   metFORMIN 750 MG 24 hr tablet Commonly known as:  GLUCOPHAGE-XR Take 1 tablet (750 mg total) by mouth 2 (two) times daily. Notes to patient:  !!!!!!!!!!!!!!!!!!!!!!!!!!!!! !!!!!!!!!!!!!!!!!!!!!!!!!!!!! !!!!!!!!!!!!!!!!!!!!!!!!!!!!! Do not restart your metformin until evening of 11/19/15 (needs to be held 48 hours after cardiac catheterization).   nitroGLYCERIN 0.4 MG SL tablet Commonly known as:  NITROSTAT Place 1 tablet (0.4 mg total) under the tongue every 5 (five) minutes as needed.  ONE TOUCH ULTRA TEST test strip Generic drug:  glucose blood Check blood sugar once a day. Dx 250.00   TRUE METRIX BLOOD GLUCOSE TEST test strip Generic drug:  glucose blood Check blood sugar twice daily. Dx. E11.9   ticagrelor 90 MG Tabs tablet Commonly known as:  BRILINTA Take 1 tablet (90 mg total) by mouth 2 (two) times daily.   TRUE METRIX AIR GLUCOSE METER w/Device Kit 1 Device by Does not apply route daily. Check blood sugar twice daily. Dx. E11.9   TRUEPLUS LANCETS 33G Misc Check blood sugar twice daily. Dx. E11.9   Turmeric Powd Take by mouth See admin instructions. Takes "half a spoonful" once a day.        Allergies:  Allergies  Allergen Reactions  . Ace Inhibitors Anaphylaxis    angioedema  . Contrast Media [Iodinated Diagnostic Agents] Itching      Outstanding Labs/Studies   NA  Duration of Discharge Encounter   Greater than 30 minutes including physician time.  Signed, Charlie Pitter PA-C 11/18/2015, 10:52 AM  I have examined the patient and reviewed assessment and plan and discussed with patient.  Agree with above as stated.  Doing well post PCI.  Some swelling/bruising  at the right hand. Pulse intact. No CP with walking.  Stressed importance of DAPT with the patient and family.  He had concerns over Brilinta cost.  Will check with the case manager.  If Kary Kos will be very expensive, ok to switch to Plavix.  WOuld give a 300 mg loading dose followed by 75 mg daily. COntinue aggressive secondary prevention.  Amlodipine started for BP control.    Larae Grooms

## 2015-11-23 ENCOUNTER — Telehealth: Payer: Self-pay | Admitting: Family Medicine

## 2015-11-23 ENCOUNTER — Telehealth: Payer: Self-pay | Admitting: *Deleted

## 2015-11-23 NOTE — Telephone Encounter (Signed)
Please advise 

## 2015-11-23 NOTE — Telephone Encounter (Signed)
°  Relation to PO:[id number] Call back number:[phone] Pharmacy:[facility]  Reason for call: pt states he will be leaving to go out of the country for 3 months and wants to know if he can get a 90 day supply on his medication to ensure he has enough to last while he is gone. Pt will be leaving on 11/10 and returning I Feburary. Pt needs rx amLODipine (NORVASC) 2.5 MG tablet, carvedilol (COREG) 12.5 MG tablet , atorvastatin (LIPITOR) 20 MG tablet , metFORMIN (GLUCOPHAGE-XR) 750 MG 24 hr tablet, and  ticagrelor (BRILINTA) 90 MG TABS tablet   pt would like a call to confirm rx was sent

## 2015-11-23 NOTE — Telephone Encounter (Signed)
Patient walked in and wanted someone to look at his right arm cath site.  Site had minimal brusing, no bleeding, no redness, no heat, no swelling.  I explained that the site looked normal for this procedure.  Told patient to call if any swelling, bleeding, pain, redness and/or unusual warmth.

## 2015-11-24 ENCOUNTER — Other Ambulatory Visit: Payer: Self-pay

## 2015-11-24 DIAGNOSIS — I10 Essential (primary) hypertension: Secondary | ICD-10-CM

## 2015-11-24 DIAGNOSIS — E1165 Type 2 diabetes mellitus with hyperglycemia: Secondary | ICD-10-CM

## 2015-11-24 DIAGNOSIS — IMO0001 Reserved for inherently not codable concepts without codable children: Secondary | ICD-10-CM

## 2015-11-24 DIAGNOSIS — E785 Hyperlipidemia, unspecified: Secondary | ICD-10-CM

## 2015-11-24 MED ORDER — ATORVASTATIN CALCIUM 20 MG PO TABS: 20.0000 mg | ORAL_TABLET | Freq: Every evening | ORAL | 0 refills | Status: DC

## 2015-11-24 MED ORDER — CARVEDILOL 12.5 MG PO TABS: 12.5000 mg | ORAL_TABLET | Freq: Two times a day (BID) | ORAL | 2 refills | Status: DC

## 2015-11-24 MED ORDER — AMLODIPINE BESYLATE 2.5 MG PO TABS: 2.5000 mg | ORAL_TABLET | Freq: Every day | ORAL | 0 refills | Status: DC

## 2015-11-24 MED ORDER — METFORMIN HCL ER 750 MG PO TB24: 750.0000 mg | ORAL_TABLET | Freq: Two times a day (BID) | ORAL | 1 refills | Status: DC

## 2015-11-24 NOTE — Telephone Encounter (Signed)
That is fine 

## 2015-11-25 ENCOUNTER — Other Ambulatory Visit: Payer: Self-pay

## 2015-11-25 NOTE — Progress Notes (Addendum)
Cardiology Office Note    Date:  11/30/2015   ID:  Eddie Mcdaniel, Eddie Mcdaniel December 14, 1940, MRN [id number]  PCP:  Ann Held, DO  Cardiologist:  Dr. Saunders Revel   CC: post hospital follow up  History of Present Illness:  Eddie Mcdaniel is a 75 y.o. male with a history of type 2 diabetes mellitus, hypertension, hyperlipidemia, GERD and recently diagnosed CAD s/p DES to LAD who presents to clinic for post hospital follow up.   He was admitted 10/19-10/20/17 for planned cath due to recent angina pectoris and abnormal stress test. LHC 11/18/15 showed diffusely disease LAD s/p DES to LAD, mod-severe ostial diagonal disease with tiny diag jailed by stent - given small size, PCI not performed, mod nonobstructive mRCA disease, EF 55-65%. He had itching post-cath which was relieved by Benadryl, felt to represent a delayed contrast media allergy. He had no respiratory difficulty or apparent rash. The patient was instructed in the future to notify his doctor or team if he requires any further procedures with contrast dye as he will require pre-medication. He was started on Brilinta. Dr. Saunders Revel titrated his atorvastatin from 10 to 3m daily with recommendation to consider escalating further in follow-up. Recent LDL was 65. Dr. ESaunders Revelalso had recommended could consider switching Brilinta to Plavix in outpatient setting if needed.   Today he presents to clinic for follow up. Since he had his cath he has been doing well with no chest pain or SOB but he has not been very active. He has just been walking around the house. He is eager to start walking outside again. [place]. No blood in stool or urine. No LE edema, orthopnea or PND. He has a little arm pain and bruising from arm pain.     Past Medical History:  Diagnosis Date  . Contrast media allergy   . Coronary artery disease    a. [place]/abnl nuc s/p  LHC 11/18/15 showed diffusely disease LAD s/p DES to LAD,  mod-severe ostial diagonal disease with tiny diag jailed by stent - given small size, PCI not performed, mod nonobstructive mRCA disease, EF 55-65%.  .Marland KitchenGERD (gastroesophageal reflux disease)   . Hx of adenomatous polyp of colon 10/06/2015  . Hyperlipidemia   . Hypertension   . Type II diabetes mellitus ([place]     Past Surgical History:  Procedure Laterality Date  . CARDIAC CATHETERIZATION N/A 11/17/2015   Procedure: Left Heart Cath and Coronary Angiography;  Surgeon: CNelva Bush MD;  [facility];  Service: Cardiovascular;  Laterality: N/A;  . CARDIAC CATHETERIZATION N/A 11/17/2015   Procedure: Coronary Stent Intervention;  Surgeon: CNelva Bush MD;  [facility];  Service: Cardiovascular;  Laterality: N/A;  . CATARACT EXTRACTION W/ INTRAOCULAR LENS  IMPLANT, BILATERAL Bilateral 2005  . CORONARY ANGIOPLASTY    . SHOULDER ARTHROSCOPY W/ ROTATOR CUFF REPAIR Right 2000s X 1  . SHOULDER OPEN ROTATOR CUFF REPAIR Right 2000s X 1   "after 1st OR didn't work; had to put tissue in this time"    Current Medications: Outpatient Medications Prior to Visit  Medication Sig Dispense Refill  . aspirin EC 81 MG tablet Take 1 tablet (81 mg total) by mouth daily. 90 tablet 3  . Blood Glucose Monitoring Suppl (TRUE METRIX AIR GLUCOSE METER) W/DEVICE KIT 1 Device by Does not apply route daily. Check blood sugar twice daily. Dx. E11.9 1 kit  0  . CINNAMON PO Take by mouth See admin instructions. Takes "half a spoonful" once a day.    . nitroGLYCERIN (NITROSTAT) 0.4 MG SL tablet Place 1 tablet (0.4 mg total) under the tongue every 5 (five) minutes as needed. 25 tablet 3  . TRUE METRIX BLOOD GLUCOSE TEST test strip Check blood sugar twice daily. Dx. E11.9 100 each 12  . TRUEPLUS LANCETS 33G MISC Check blood sugar twice daily. Dx. E11.9 100 each 12  . Turmeric POWD Take by mouth See admin instructions. Takes "half a spoonful" once a day.    Marland Kitchen amLODipine (NORVASC) 2.5 MG  tablet Take 1 tablet (2.5 mg total) by mouth daily. 90 tablet 0  . atorvastatin (LIPITOR) 20 MG tablet Take 1 tablet (20 mg total) by mouth every evening. 90 tablet 0  . carvedilol (COREG) 12.5 MG tablet Take 1 tablet (12.5 mg total) by mouth 2 (two) times daily with a meal. 180 tablet 2  . metFORMIN (GLUCOPHAGE-XR) 750 MG 24 hr tablet Take 1 tablet (750 mg total) by mouth 2 (two) times daily. 180 tablet 1  . ticagrelor (BRILINTA) 90 MG TABS tablet Take 1 tablet (90 mg total) by mouth 2 (two) times daily. 180 tablet 3  . ONE TOUCH ULTRA TEST test strip Check blood sugar once a day. Dx 250.00 (Patient not taking: Reported on 11/30/2015) 300 each 4   No facility-administered medications prior to visit.      Allergies:   Ace inhibitors and Contrast media [iodinated diagnostic agents]   Social History   Social History  . Marital status: Married    Spouse name: N/A  . Number of children: N/A  . Years of education: N/A   Occupational History  . retired    Social History Main Topics  . Smoking status: Never Smoker  . Smokeless tobacco: Former Systems developer    Types: Chew    Quit date: 04/30/2010  . Alcohol use No  . Drug use: No  . Sexual activity: No   Other Topics Concern  . None   Social History Narrative   Walking daily   yoga           Family History:  The patient's family history includes Cancer (age of onset: 70) in his brother; Diabetes in his brother and mother; Heart disease in his brother and brother; Hyperlipidemia in his brother; Hypertension in his brother and mother; Stroke in his brother.     ROS:   Please see the history of present illness.    ROS All other systems reviewed and are negative.   PHYSICAL EXAM:   VS:  BP 138/70   Pulse 62   Ht _0  (1.651 m)   Wt 149 lb 12.8 oz (67.9 kg)   SpO2 98%   BMI 24.93 kg/m    GEN: Well nourished, well developed, in no acute distress  HEENT: normal  Neck: no JVD, carotid bruits, or masses Cardiac: RRR; no murmurs, rubs,  or gallops,no edema  Respiratory:  clear to auscultation bilaterally, normal work of breathing GI: soft, nontender, nondistended, + BS MS: no deformity or atrophy  Skin: warm and dry, no rash Neuro:  Alert and Oriented x 3, Strength and sensation are intact Psych: euthymic mood, full affect  Wt Readings from Last 3 Encounters:  11/30/15 149 lb 12.8 oz (67.9 kg)  11/18/15 142 lb 10.2 oz (64.7 kg)  11/11/15 147 lb 12.8 oz (67 kg)      Studies/Labs Reviewed:   EKG:  EKG is NOT ordered today.    Recent Labs: 10/25/2015: ALT 15 11/18/2015: BUN 6; Creatinine, Ser 0.71; Hemoglobin 12.1; Platelets 141; Potassium 4.0; Sodium 135   Lipid Panel    Component Value Date/Time   CHOL 97 10/25/2015 1127   TRIG 103.0 10/25/2015 1127   HDL 31.30 (L) 10/25/2015 1127   CHOLHDL 3 10/25/2015 1127   VLDL 20.6 10/25/2015 1127   LDLCALC 45 10/25/2015 1127    Additional studies/ records that were reviewed today include:  Cath 11/17/15 Conclusions: 1. Significant single vessel coronary artery disease with diffusely diseased LAD with long segment of severe stenosis up to 95% in the mid section. 2. Multiple small diagonal branches with moderate to severe ostial disease. One tiny diagonal that was jailed by the stent demonstrates TIMI 2 flow at the end of the procedure. Given its small size and lack of chest pain, intervention was not attempted on this vessel. 3. Moderate, nonobstructive disease involving the mid RCA. 4. Successful PCI to mid LAD with placement of an integrity resolute 2.5 x 30 mm drug-eluting stent (post dilated at high pressure with 2.75 mm [place] balloon) with 0% residual stenosis and TIMI-3 flow. 5. Normal LV systolic function. 6. Upper normal to mildly elevated left ventricular filling pressure (LVEDP 15-20 mmHg).  Recommendations: 1. Dual antiplatelet therapy for at least 6 months, ideally longer. Patient was loaded with ticagrelor during the procedure but could be switched to  clopidogrel at the time of discharge or outpatient follow-up. 2. Aggressive secondary prevention. We will increase atorvastatin to 20 mg daily with plan to escalate further in the future. 3. Follow-up as an outpatient in 2-3 weeks to ensure that the patient is doing well before his planned trip to [place] next month.     ASSESSMENT & PLAN:   CAD s/p DES to LAD: contineu ASA/Brilinta, statin and BB. He has not been walking. I have encouraged him restart his exercise program.   DMT2: HgA1c well controlled at 6.0  HTN: BP is not well controlled. He has kept a log and SBPs averaging in 150-180. Will increase amlodipine from 2.5--> 62m. He is not keen on adding more medications at this time, but eventually he should be placed on ACE/ARB in the setting DM. He will continue to keep a log of his BPs and see me back next Monday before he leave on [place] If his BPs are still uncontrolled we will increase it 171mdaily.  ( we prescribed 1037mn the case that we need to titrate up and he needed mail order to be send ASAP [place]  HLD: LDL excellent at 45 on atorvastatin 21m9mily.    Medication Adjustments/Labs and Tests Ordered: Current medicines are reviewed at length with the patient today.  Concerns regarding medicines are outlined above.  Medication changes, Labs and Tests ordered today are listed in the Patient Instructions below. Patient Instructions  Medication Instructions:  Your physician has recommended you make the following change in your medication:  1.  INCREASE the Amlodipine to 5 mg taking 1 tablet daily   Labwork: None ordered  Testing/Procedures: None ordered  Follow-Up: Your physician wants you to follow-up in:  12-05-15 WITH KATIE Shefali Ng, PA-C ARRIVE AT 8:45. A.M.    Any Other Special Instructions Will Be Listed Below (If Applicable).   If you need a refill on your cardiac medications before your next appointment, please  call your pharmacy.  Signed, Angelena Form, PA-C  11/30/2015 9:08 AM    [facility] [address], [place], [place]  [address] Phone: [phone]; Fax: [phone]

## 2015-11-25 NOTE — Telephone Encounter (Signed)
Pt states that he [facility] yesterday stating [facility] had a rx for his lipitor and he states he cancelled it because all of the meds should be sent to [facility], pt just making sure that his initial request is being taking care of

## 2015-11-25 NOTE — Telephone Encounter (Signed)
Called in patient's Rx at [facility].

## 2015-11-30 ENCOUNTER — Encounter: Payer: Self-pay | Admitting: Physician Assistant

## 2015-11-30 ENCOUNTER — Ambulatory Visit (INDEPENDENT_AMBULATORY_CARE_PROVIDER_SITE_OTHER): Payer: Commercial Managed Care - HMO | Admitting: Physician Assistant

## 2015-11-30 VITALS — BP 138/70 | HR 62 | Ht 65.0 in | Wt 149.8 lb

## 2015-11-30 DIAGNOSIS — I10 Essential (primary) hypertension: Secondary | ICD-10-CM

## 2015-11-30 DIAGNOSIS — E785 Hyperlipidemia, unspecified: Secondary | ICD-10-CM

## 2015-11-30 DIAGNOSIS — I2510 Atherosclerotic heart disease of native coronary artery without angina pectoris: Secondary | ICD-10-CM | POA: Diagnosis not present

## 2015-11-30 DIAGNOSIS — IMO0001 Reserved for inherently not codable concepts without codable children: Secondary | ICD-10-CM

## 2015-11-30 DIAGNOSIS — Z9861 Coronary angioplasty status: Secondary | ICD-10-CM

## 2015-11-30 DIAGNOSIS — E118 Type 2 diabetes mellitus with unspecified complications: Secondary | ICD-10-CM | POA: Diagnosis not present

## 2015-11-30 DIAGNOSIS — E1165 Type 2 diabetes mellitus with hyperglycemia: Secondary | ICD-10-CM

## 2015-11-30 MED ORDER — ATORVASTATIN CALCIUM 20 MG PO TABS: 20.0000 mg | ORAL_TABLET | Freq: Every evening | ORAL | 1 refills | Status: DC

## 2015-11-30 MED ORDER — TICAGRELOR 90 MG PO TABS: 90.0000 mg | ORAL_TABLET | Freq: Two times a day (BID) | ORAL | 3 refills | Status: DC

## 2015-11-30 MED ORDER — AMLODIPINE BESYLATE 10 MG PO TABS: 10.0000 mg | ORAL_TABLET | Freq: Every day | ORAL | 1 refills | Status: DC

## 2015-11-30 MED ORDER — METFORMIN HCL ER 750 MG PO TB24: 750.0000 mg | ORAL_TABLET | Freq: Two times a day (BID) | ORAL | 1 refills | Status: DC

## 2015-11-30 MED ORDER — CARVEDILOL 12.5 MG PO TABS: 12.5000 mg | ORAL_TABLET | Freq: Two times a day (BID) | ORAL | 2 refills | Status: DC

## 2015-11-30 NOTE — Patient Instructions (Addendum)
Medication Instructions:  Your physician has recommended you make the following change in your medication:  1.  INCREASE the Amlodipine to 5 mg taking 1 tablet daily   Labwork: None ordered  Testing/Procedures: None ordered  Follow-Up: Your physician wants you to follow-up in:  12-05-15 WITH KATIE THOMPSON, PA-C ARRIVE AT 8:45. A.M.    Any Other Special Instructions Will Be Listed Below (If Applicable).   If you need a refill on your cardiac medications before your next appointment, please call your pharmacy.

## 2015-11-30 NOTE — Progress Notes (Signed)
Cardiology Office Note    Date:  12/05/2015   ID:  Eddie Mcdaniel, Eddie Mcdaniel 09/16/1940, MRN [id number]  PCP:  Ann Held, DO  Cardiologist:  Dr. Saunders Mcdaniel   CC: post hospital follow up  History of Present Illness:  Eddie Mcdaniel is a 75 y.o. male with a history of type 2 diabetes mellitus, hypertension, hyperlipidemia, GERD and recently diagnosed CAD s/p DES to LAD who presents to clinic for post hospital follow up.   He was admitted 10/19-10/20/17 for planned cath due to recent angina pectoris and abnormal stress test. LHC 11/18/15 showed diffusely disease LAD s/p DES to LAD, mod-severe ostial diagonal disease with tiny diag jailed by stent - given small size, PCI not performed, mod nonobstructive mRCA disease, EF 55-65%. He had itching post-cath which was relieved by Benadryl, felt to represent a delayed contrast media allergy. He had no respiratory difficulty or apparent rash. The patient was instructed in the future to notify his doctor or team if he requires any further procedures with contrast dye as he will require pre-medication. He was started on Brilinta. Dr. Saunders Mcdaniel titrated his atorvastatin from 10 to '20mg'$  daily with recommendation to consider escalating further in follow-up. Recent LDL was 65. Dr. Saunders Mcdaniel also had recommended could consider switching Brilinta to Plavix in outpatient setting if needed.   I saw him in clinic on 11/1 for follow up.  He had been doing well but had not been walking. I encouraged him to resume his previous walking schedule. His BP was also uncontrolled on his home BP log with  SBPs averaging in 150-180. I increased his amlodipine from 2.5 --> '5mg'$  daily.   Today he presents to clinic for follow up. He has kept a BP log and BPs remain high from 133/72-170/72. He has been having some urinary frequency. He has been walking 3500 steps in the AM with no chest pain or SOB. No LE edema, orthopnea, or PND. No dizziness or syncope. Still waiting on meds to come from  mail order.   Past Medical History:  Diagnosis Date  . Contrast media allergy   . Coronary artery disease    a. [place]/abnl nuc s/p  LHC 11/18/15 showed diffusely disease LAD s/p DES to LAD, mod-severe ostial diagonal disease with tiny diag jailed by stent - given small size, PCI not performed, mod nonobstructive mRCA disease, EF 55-65%.  Marland Kitchen GERD (gastroesophageal reflux disease)   . Hx of adenomatous polyp of colon 10/06/2015  . Hyperlipidemia   . Hypertension   . Type II diabetes mellitus ([place])     Past Surgical History:  Procedure Laterality Date  . CARDIAC CATHETERIZATION N/A 11/17/2015   Procedure: Left Heart Cath and Coronary Angiography;  Surgeon: Eddie Bush, MD;  Location: [facility];  Service: Cardiovascular;  Laterality: N/A;  . CARDIAC CATHETERIZATION N/A 11/17/2015   Procedure: Coronary Stent Intervention;  Surgeon: Eddie Bush, MD;  Location: [facility];  Service: Cardiovascular;  Laterality: N/A;  . CATARACT EXTRACTION W/ INTRAOCULAR LENS  IMPLANT, BILATERAL Bilateral 2005  . CORONARY ANGIOPLASTY    . SHOULDER ARTHROSCOPY W/ ROTATOR CUFF REPAIR Right 2000s X 1  . SHOULDER OPEN ROTATOR CUFF REPAIR Right 2000s X 1   "after 1st OR didn't work; had to put tissue in this time"    Current Medications: Outpatient Medications Prior to Visit  Medication Sig Dispense Refill  . amLODipine (NORVASC) 10 MG tablet Take 1 tablet (10 mg total) by mouth daily. [place]yx  tablet 1  . aspirin EC 81 MG tablet Take 1 tablet (81 mg total) by mouth daily. 90 tablet 3  . atorvastatin (LIPITOR) 20 MG tablet Take 1 tablet (20 mg total) by mouth every evening. 90 tablet 1  . Blood Glucose Monitoring Suppl (TRUE METRIX AIR GLUCOSE METER) W/DEVICE KIT 1 Device by Does not apply route daily. Check blood sugar twice daily. Dx. E11.9 1 kit 0  . carvedilol (COREG) 12.5 MG tablet Take 1 tablet (12.5 mg total) by mouth 2 (two) times daily with a meal. 180 tablet 2  . CINNAMON PO Take by  mouth See admin instructions. Takes "half a spoonful" once a day.    . metFORMIN (GLUCOPHAGE-XR) 750 MG 24 hr tablet Take 1 tablet (750 mg total) by mouth 2 (two) times daily. 180 tablet 1  . ticagrelor (BRILINTA) 90 MG TABS tablet Take 1 tablet (90 mg total) by mouth 2 (two) times daily. 180 tablet 3  . TRUE METRIX BLOOD GLUCOSE TEST test strip Check blood sugar twice daily. Dx. E11.9 100 each 12  . TRUEPLUS LANCETS 33G MISC Check blood sugar twice daily. Dx. E11.9 100 each 12  . Turmeric POWD Take by mouth See admin instructions. Takes "half a spoonful" once a day.    . nitroGLYCERIN (NITROSTAT) 0.4 MG SL tablet Place 1 tablet (0.4 mg total) under the tongue every 5 (five) minutes as needed. (Patient not taking: Reported on 12/05/2015) 25 tablet 3   No facility-administered medications prior to visit.      Allergies:   Ace inhibitors and Contrast media [iodinated diagnostic agents]   Social History   Social History  . Marital status: Married    Spouse name: N/A  . Number of children: N/A  . Years of education: N/A   Occupational History  . retired    Social History Main Topics  . Smoking status: Never Smoker  . Smokeless tobacco: Former Systems developer    Types: Chew    Quit date: 04/30/2010  . Alcohol use No  . Drug use: No  . Sexual activity: No   Other Topics Concern  . None   Social History Narrative   Walking daily   yoga           Family History:  The patient's family history includes Cancer (age of onset: 4) in his brother; Diabetes in his brother and mother; Heart disease in his brother and brother; Hyperlipidemia in his brother; Hypertension in his brother and mother; Stroke in his brother.     ROS:   Please see the history of present illness.    ROS All other systems reviewed and are negative.   PHYSICAL EXAM:   VS:  BP (!) 142/80   Pulse 63   Ht '5\' 5"'$  (1.651 m)   Wt 148 lb 6.4 oz (67.3 kg)   BMI 24.70 kg/m    GEN: Well nourished, well developed, in no acute  distress  HEENT: normal  Neck: no JVD, carotid bruits, or masses Cardiac: RRR; no murmurs, rubs, or gallops,no edema  Respiratory:  clear to auscultation bilaterally, normal work of breathing GI: soft, nontender, nondistended, + BS MS: no deformity or atrophy  Skin: warm and dry, no rash Neuro:  Alert and Oriented x 3, Strength and sensation are intact Psych: euthymic mood, full affect  Wt Readings from Last 3 Encounters:  12/05/15 148 lb 6.4 oz (67.3 kg)  11/30/15 149 lb 12.8 oz (67.9 kg)  11/18/15 142 lb 10.2 oz (64.7 kg)  Studies/Labs Reviewed:   EKG:  EKG is NOT ordered today.    Recent Labs: 10/25/2015: ALT 15 11/18/2015: BUN 6; Creatinine, Ser 0.71; Hemoglobin 12.1; Platelets 141; Potassium 4.0; Sodium 135   Lipid Panel    Component Value Date/Time   CHOL 97 10/25/2015 1127   TRIG 103.0 10/25/2015 1127   HDL 31.30 (L) 10/25/2015 1127   CHOLHDL 3 10/25/2015 1127   VLDL 20.6 10/25/2015 1127   LDLCALC 45 10/25/2015 1127    Additional studies/ records that were reviewed today include:  Cath 11/17/15 Conclusions: 1. Significant single vessel coronary artery disease with diffusely diseased LAD with long segment of severe stenosis up to 95% in the mid section. 2. Multiple small diagonal branches with moderate to severe ostial disease. One tiny diagonal that was jailed by the stent demonstrates TIMI 2 flow at the end of the procedure. Given its small size and lack of chest pain, intervention was not attempted on this vessel. 3. Moderate, nonobstructive disease involving the mid RCA. 4. Successful PCI to mid LAD with placement of an integrity resolute 2.5 x 30 mm drug-eluting stent (post dilated at high pressure with 2.75 mm [place] balloon) with 0% residual stenosis and TIMI-3 flow. 5. Normal LV systolic function. 6. Upper normal to mildly elevated left ventricular filling pressure (LVEDP 15-20 mmHg).  Recommendations: 1. Dual antiplatelet therapy for at least 6 months,  ideally longer. Patient was loaded with ticagrelor during the procedure but could be switched to clopidogrel at the time of discharge or outpatient follow-up. 2. Aggressive secondary prevention. We will increase atorvastatin to 20 mg daily with plan to escalate further in the future. 3. Follow-up as an outpatient in 2-3 weeks to ensure that the patient is doing well before his planned trip to [place] next month.     ASSESSMENT & PLAN:   CAD s/p DES to LAD: contineu ASA/Brilinta, statin and BB.   DMT2: HgA1c well controlled at 6.0  HTN: BP still not under control. Will increase amlodipine to '10mg'$  daily.   HLD: LDL excellent at 45 on atorvastatin '20mg'$  daily.    Medication Adjustments/Labs and Tests Ordered: Current medicines are reviewed at length with the patient today.  Concerns regarding medicines are outlined above.  Medication changes, Labs and Tests ordered today are listed in the Patient Instructions below. Patient Instructions  Medication Instructions:  Your physician has recommended you make the following change in your medication:  1-START taking Amilodipine 10 mg by mouth daily.  Labwork: NONE  Testing/Procedures: NONE  Follow-Up: Your physician wants you to follow-up in: 6 months with Dr. Saunders Mcdaniel. You will receive a reminder letter in the mail two months in advance. If you don't receive a letter, please call our office to schedule the follow-up appointment.   If you need a refill on your cardiac medications before your next appointment, please call your pharmacy.       Signed, Angelena Form, PA-C  12/05/2015 9:28 AM    [facility] [address], [place], [place]  [address] Phone: [phone]; Fax: [phone]

## 2015-12-05 ENCOUNTER — Ambulatory Visit (INDEPENDENT_AMBULATORY_CARE_PROVIDER_SITE_OTHER): Payer: Commercial Managed Care - HMO | Admitting: Physician Assistant

## 2015-12-05 ENCOUNTER — Encounter: Payer: Self-pay | Admitting: Physician Assistant

## 2015-12-05 VITALS — BP 142/80 | HR 63 | Ht 65.0 in | Wt 148.4 lb

## 2015-12-05 DIAGNOSIS — E118 Type 2 diabetes mellitus with unspecified complications: Secondary | ICD-10-CM

## 2015-12-05 DIAGNOSIS — I10 Essential (primary) hypertension: Secondary | ICD-10-CM | POA: Diagnosis not present

## 2015-12-05 DIAGNOSIS — Z9861 Coronary angioplasty status: Secondary | ICD-10-CM

## 2015-12-05 DIAGNOSIS — E785 Hyperlipidemia, unspecified: Secondary | ICD-10-CM

## 2015-12-05 DIAGNOSIS — I2510 Atherosclerotic heart disease of native coronary artery without angina pectoris: Secondary | ICD-10-CM

## 2015-12-05 NOTE — Patient Instructions (Addendum)
Medication Instructions:  Your physician has recommended you make the following change in your medication:  1-START taking Amilodipine 10 mg by mouth daily.  Labwork: NONE  Testing/Procedures: NONE  Follow-Up: Your physician wants you to follow-up in: 6 months with Dr. Saunders Revel. You will receive a reminder letter in the mail two months in advance. If you don't receive a letter, please call our office to schedule the follow-up appointment.   If you need a refill on your cardiac medications before your next appointment, please call your pharmacy.

## 2016-02-01 ENCOUNTER — Telehealth: Payer: Self-pay | Admitting: Family Medicine

## 2016-02-01 NOTE — Telephone Encounter (Signed)
08/19/14 PR PPPS, SUBSEQ VISIT [id number] lvm advising patient to scheduled medicare wellness appointment.

## 2016-03-19 ENCOUNTER — Encounter (HOSPITAL_COMMUNITY): Payer: Self-pay | Admitting: Family Medicine

## 2016-03-19 NOTE — Progress Notes (Signed)
Mailed patient letter with information about Cardiac Rehab program. MW °

## 2016-03-21 ENCOUNTER — Telehealth: Payer: Self-pay | Admitting: *Deleted

## 2016-03-21 NOTE — Telephone Encounter (Signed)
Spoke with pt re: switching to Plavix from Brilinta. Pt wanted me to hold off on sending a rx to the pharmacy until he calls back.  He wants to call and check on the price of the med and to see which pharmacy he needs to use.  I will await for pt to call back.

## 2016-03-21 NOTE — Telephone Encounter (Signed)
-----   Message from Eileen Stanford, [place] sent at 03/21/2016  9:07 AM EST -----   ----- Message ----- From: Nelva Bush, MD Sent: 03/21/2016   8:38 AM To: Eileen Stanford, PA-C  Sure. I think clopidogrel is fine. I would load him with 300 mg the first day and continue 75 mg daily thereafter. Let me know if any other issues come up. He should probably see one of [place] in the next month or two, since it has been a while since he was in clinic. Thanks for your help.  Gerald Stabs  ----- Message ----- From: Eileen Stanford, PA-C Sent: 03/21/2016   8:25 AM To: Nelva Bush, MD  I just got some paperwork from medicare denying coverage for [place]. I called patient and he says it is to expensive. Would you be okay with me switching him to plavix?

## 2016-03-21 NOTE — Progress Notes (Signed)
We are going to switch him to plavix with a 300mg  load. Can you bring him in to see me in the next couple weeks and we can discuss and switch him here. Thanks!

## 2016-03-29 ENCOUNTER — Encounter: Payer: Self-pay | Admitting: Family Medicine

## 2016-03-29 ENCOUNTER — Ambulatory Visit (INDEPENDENT_AMBULATORY_CARE_PROVIDER_SITE_OTHER): Payer: Medicare HMO | Admitting: Family Medicine

## 2016-03-29 VITALS — BP 112/58 | HR 62 | Temp 97.7°F | Resp 16 | Ht 64.0 in | Wt 148.8 lb

## 2016-03-29 DIAGNOSIS — E1165 Type 2 diabetes mellitus with hyperglycemia: Secondary | ICD-10-CM | POA: Diagnosis not present

## 2016-03-29 DIAGNOSIS — I10 Essential (primary) hypertension: Secondary | ICD-10-CM

## 2016-03-29 DIAGNOSIS — M5441 Lumbago with sciatica, right side: Secondary | ICD-10-CM

## 2016-03-29 DIAGNOSIS — I2510 Atherosclerotic heart disease of native coronary artery without angina pectoris: Secondary | ICD-10-CM

## 2016-03-29 DIAGNOSIS — Z0000 Encounter for general adult medical examination without abnormal findings: Secondary | ICD-10-CM | POA: Diagnosis not present

## 2016-03-29 DIAGNOSIS — E785 Hyperlipidemia, unspecified: Secondary | ICD-10-CM

## 2016-03-29 DIAGNOSIS — IMO0001 Reserved for inherently not codable concepts without codable children: Secondary | ICD-10-CM

## 2016-03-29 LAB — COMPREHENSIVE METABOLIC PANEL
ALBUMIN: 4.3 g/dL (ref 3.5–5.2)
ALK PHOS: 42 U/L (ref 39–117)
ALT: 15 U/L (ref 0–53)
AST: 17 U/L (ref 0–37)
BILIRUBIN TOTAL: 0.5 mg/dL (ref 0.2–1.2)
BUN: 12 mg/dL (ref 6–23)
CO2: 29 mEq/L (ref 19–32)
CREATININE: 0.79 mg/dL (ref 0.40–1.50)
Calcium: 9.6 mg/dL (ref 8.4–10.5)
Chloride: 102 mEq/L (ref 96–112)
GFR: 101.94 mL/min (ref >60.00)
GLUCOSE: 121 mg/dL — AB (ref 70–99)
POTASSIUM: 4.2 meq/L (ref 3.5–5.1)
SODIUM: 136 meq/L (ref 135–145)
TOTAL PROTEIN: 7.3 g/dL (ref 6.0–8.3)

## 2016-03-29 LAB — CBC
HCT: 34.1 % — ABNORMAL LOW (ref 39.0–52.0)
HEMOGLOBIN: 11.6 g/dL — AB (ref 13.0–17.0)
MCHC: 34 g/dL (ref 30.0–36.0)
MCV: 89.5 fl (ref 78.0–100.0)
Platelets: 233 10*3/uL (ref 150.0–400.0)
RBC: 3.81 Mil/uL — ABNORMAL LOW (ref 4.22–5.81)
RDW: 13.6 % (ref 11.5–15.5)
WBC: 9.5 10*3/uL (ref 4.0–10.5)

## 2016-03-29 LAB — LIPID PANEL
CHOLESTEROL: 103 mg/dL (ref 0–200)
HDL: 35.5 mg/dL — AB (ref >39.00)
LDL Cholesterol: 50 mg/dL (ref 0–99)
NONHDL: 67.29
Total CHOL/HDL Ratio: 3
Triglycerides: 85 mg/dL (ref 0.0–149.0)
VLDL: 17 mg/dL (ref 0.0–40.0)

## 2016-03-29 LAB — HEMOGLOBIN A1C: HEMOGLOBIN A1C: 6.3 % (ref 4.6–6.5)

## 2016-03-29 MED ORDER — AMLODIPINE BESYLATE 10 MG PO TABS: 10.0000 mg | ORAL_TABLET | Freq: Every day | ORAL | 1 refills | Status: DC

## 2016-03-29 MED ORDER — ATORVASTATIN CALCIUM 20 MG PO TABS: 20.0000 mg | ORAL_TABLET | Freq: Every evening | ORAL | 1 refills | Status: DC

## 2016-03-29 MED ORDER — TRIAMCINOLONE ACETONIDE 0.025 % EX OINT: 1.0000 "application " | TOPICAL_OINTMENT | Freq: Two times a day (BID) | CUTANEOUS | 0 refills | Status: DC

## 2016-03-29 MED ORDER — METFORMIN HCL ER 750 MG PO TB24: 750.0000 mg | ORAL_TABLET | Freq: Two times a day (BID) | ORAL | 1 refills | Status: DC

## 2016-03-29 MED ORDER — CARVEDILOL 12.5 MG PO TABS: 12.5000 mg | ORAL_TABLET | Freq: Two times a day (BID) | ORAL | 2 refills | Status: DC

## 2016-03-29 NOTE — Assessment & Plan Note (Signed)
Check labs con't meds 

## 2016-03-29 NOTE — Assessment & Plan Note (Signed)
Need MRI results from [place] Based on what pt said he will need neurosurgeon

## 2016-03-29 NOTE — Progress Notes (Signed)
Pre visit review using our clinic review tool, if applicable. No additional management support is needed unless otherwise documented below in the visit note. 

## 2016-03-29 NOTE — Progress Notes (Signed)
Subjective:    Patient ID: Eddie Mcdaniel, male    DOB: 1940-12-09, 76 y.o.   MRN: [id number]   I acted as a Education administrator for Dr. Royden Purl, LPN   Chief Complaint  Patient presents with  . Leg Pain    Leg Pain   The incident occurred more than 1 week ago. The pain is present in the left leg. The pain is at a severity of 10/10. The pain is moderate. The pain has been constant since onset. The treatment provided no relief.    Patient is in today for right hip since 3 months ago. Pt report he had an MRI done in [place] and has narrowing of the spine at the L4 and L5, also MRI showed swollen, and a pinched nerve. Pt report he received Physical Therapy in [place]. Patient report every winter he start to itch all over is body. He tried several different lotion without relief.  Past Medical History:  Diagnosis Date  . Contrast media allergy   . Coronary artery disease    a. USA/abnl nuc s/p  LHC 11/18/15 showed diffusely disease LAD s/p DES to LAD, mod-severe ostial diagonal disease with tiny diag jailed by stent - given small size, PCI not performed, mod nonobstructive mRCA disease, EF 55-65%.  Marland Kitchen GERD (gastroesophageal reflux disease)   . Hx of adenomatous polyp of colon 10/06/2015  . Hyperlipidemia   . Hypertension   . Type II diabetes mellitus ([place])     Past Surgical History:  Procedure Laterality Date  . CARDIAC CATHETERIZATION N/A 11/17/2015   Procedure: Left Heart Cath and Coronary Angiography;  Surgeon: Nelva Bush, MD;  Location: [facility];  Service: Cardiovascular;  Laterality: N/A;  . CARDIAC CATHETERIZATION N/A 11/17/2015   Procedure: Coronary Stent Intervention;  Surgeon: Nelva Bush, MD;  Location: [facility];  Service: Cardiovascular;  Laterality: N/A;  . CATARACT EXTRACTION W/ INTRAOCULAR LENS  IMPLANT, BILATERAL Bilateral 2005  . CORONARY ANGIOPLASTY    . SHOULDER ARTHROSCOPY W/ ROTATOR CUFF REPAIR Right 2000s X 1  . SHOULDER OPEN ROTATOR CUFF  REPAIR Right 2000s X 1   "after 1st OR didn't work; had to put tissue in this time"    Family History  Problem Relation Age of Onset  . Diabetes Mother   . Hypertension Mother   . Cancer Brother 71    brain, stomach  . Hyperlipidemia Brother   . Heart disease Brother     MI  . Heart disease Brother     MI  . Diabetes Brother   . Stroke Brother   . Hypertension Brother   . Colon cancer Neg Hx   . Esophageal cancer Neg Hx   . Rectal cancer Neg Hx   . Stomach cancer Neg Hx     Social History   Social History  . Marital status: Married    Spouse name: N/A  . Number of children: N/A  . Years of education: N/A   Occupational History  . retired    Social History Main Topics  . Smoking status: Never Smoker  . Smokeless tobacco: Former Systems developer    Types: Chew    Quit date: 04/30/2010  . Alcohol use No  . Drug use: No  . Sexual activity: No   Other Topics Concern  . Not on file   Social History Narrative   Walking daily   yoga          Outpatient Medications Prior to Visit  Medication Sig Dispense Refill  . aspirin EC 81 MG tablet Take 1 tablet (81 mg total) by mouth daily. 90 tablet 3  . Blood Glucose Monitoring Suppl (TRUE METRIX AIR GLUCOSE METER) W/DEVICE KIT 1 Device by Does not apply route daily. Check blood sugar twice daily. Dx. E11.9 1 kit 0  . CINNAMON PO Take by mouth See admin instructions. Takes "half a spoonful" once a day.    . nitroGLYCERIN (NITROSTAT) 0.4 MG SL tablet Place 0.4 mg under the tongue every 5 (five) minutes x 3 doses as needed for chest pain. Call 911 at 3rd dose    . ticagrelor (BRILINTA) 90 MG TABS tablet Take 1 tablet (90 mg total) by mouth 2 (two) times daily. 180 tablet 3  . TRUE METRIX BLOOD GLUCOSE TEST test strip Check blood sugar twice daily. Dx. E11.9 100 each 12  . TRUEPLUS LANCETS 33G MISC Check blood sugar twice daily. Dx. E11.9 100 each 12  . Turmeric POWD Take by mouth See admin instructions. Takes "half a spoonful" once a  day.    Marland Kitchen amLODipine (NORVASC) 10 MG tablet Take 1 tablet (10 mg total) by mouth daily. 90 tablet 1  . atorvastatin (LIPITOR) 20 MG tablet Take 1 tablet (20 mg total) by mouth every evening. 90 tablet 1  . carvedilol (COREG) 12.5 MG tablet Take 1 tablet (12.5 mg total) by mouth 2 (two) times daily with a meal. 180 tablet 2  . metFORMIN (GLUCOPHAGE-XR) 750 MG 24 hr tablet Take 1 tablet (750 mg total) by mouth 2 (two) times daily. 180 tablet 1   No facility-administered medications prior to visit.     Allergies  Allergen Reactions  . Ace Inhibitors Anaphylaxis    angioedema  . Contrast Media [Iodinated Diagnostic Agents] Itching    Review of Systems  Constitutional: Negative for fever.  HENT: Negative for congestion.   Eyes: Negative for blurred vision.  Respiratory: Negative for cough.   Cardiovascular: Negative for chest pain and palpitations.  Gastrointestinal: Negative for vomiting.  Musculoskeletal: Negative for back pain.  Skin: Negative for rash.  Neurological: Negative for loss of consciousness and headaches.       Objective:    Physical Exam  Constitutional: He is oriented to person, place, and time. He appears well-developed and well-nourished. No distress.  HENT:  Head: Normocephalic and atraumatic.  Eyes: Conjunctivae are normal.  Neck: Normal range of motion. No thyromegaly present.  Cardiovascular: Normal rate and regular rhythm.   Pulmonary/Chest: Effort normal and breath sounds normal. He has no wheezes.  Abdominal: Soft. Bowel sounds are normal. There is no tenderness.  Musculoskeletal: Normal range of motion. He exhibits no edema or deformity.  Neurological: He is alert and oriented to person, place, and time. He has normal strength. He displays normal reflexes.  Pain with weight bearing  Skin: Skin is warm and dry. He is not diaphoretic.  Psychiatric: He has a normal mood and affect.    BP (!) 112/58 (BP Location: Left Arm, Patient Position: Sitting,  Cuff Size: Normal)   Pulse 62   Temp 97.7 F (36.5 C) (Oral)   Resp 16   Ht '5\' 4"'  (1.626 m)   Wt 148 lb 12.8 oz (67.5 kg)   SpO2 98%   BMI 25.54 kg/m  Wt Readings from Last 3 Encounters:  03/29/16 148 lb 12.8 oz (67.5 kg)  12/05/15 148 lb 6.4 oz (67.3 kg)  11/30/15 149 lb 12.8 oz (67.9 kg)     Lab Results  Component Value Date   WBC 9.5 03/29/2016   HGB 11.6 (L) 03/29/2016   HCT 34.1 (L) 03/29/2016   PLT 233.0 03/29/2016   GLUCOSE 121 (H) 03/29/2016   CHOL 103 03/29/2016   TRIG 85.0 03/29/2016   HDL 35.50 (L) 03/29/2016   LDLCALC 50 03/29/2016   ALT 15 03/29/2016   AST 17 03/29/2016   NA 136 03/29/2016   K 4.2 03/29/2016   CL 102 03/29/2016   CREATININE 0.79 03/29/2016   BUN 12 03/29/2016   CO2 29 03/29/2016   PSA 7.26 (H) 08/19/2014   INR 1.1 11/11/2015   HGBA1C 6.3 03/29/2016   MICROALBUR 1.3 02/28/2015    No results found for: TSH Lab Results  Component Value Date   WBC 9.5 03/29/2016   HGB 11.6 (L) 03/29/2016   HCT 34.1 (L) 03/29/2016   MCV 89.5 03/29/2016   PLT 233.0 03/29/2016   Lab Results  Component Value Date   NA 136 03/29/2016   K 4.2 03/29/2016   CO2 29 03/29/2016   GLUCOSE 121 (H) 03/29/2016   BUN 12 03/29/2016   CREATININE 0.79 03/29/2016   BILITOT 0.5 03/29/2016   ALKPHOS 42 03/29/2016   AST 17 03/29/2016   ALT 15 03/29/2016   PROT 7.3 03/29/2016   ALBUMIN 4.3 03/29/2016   CALCIUM 9.6 03/29/2016   ANIONGAP 5 11/18/2015   GFR 101.94 03/29/2016   Lab Results  Component Value Date   CHOL 103 03/29/2016   Lab Results  Component Value Date   HDL 35.50 (L) 03/29/2016   Lab Results  Component Value Date   LDLCALC 50 03/29/2016   Lab Results  Component Value Date   TRIG 85.0 03/29/2016   Lab Results  Component Value Date   CHOLHDL 3 03/29/2016   Lab Results  Component Value Date   HGBA1C 6.3 03/29/2016       Assessment & Plan:   Problem List Items Addressed This Visit      Unprioritized   Acute right-sided low  back pain with right-sided sciatica - Primary    Need MRI results from [place] Based on what pt said he will need neurosurgeon      CAD in native artery    Per cardiology      Relevant Medications   amLODipine (NORVASC) 10 MG tablet   atorvastatin (LIPITOR) 20 MG tablet   carvedilol (COREG) 12.5 MG tablet   Essential hypertension    Stable con't meds      Relevant Medications   amLODipine (NORVASC) 10 MG tablet   atorvastatin (LIPITOR) 20 MG tablet   carvedilol (COREG) 12.5 MG tablet   Hyperlipidemia LDL goal <70    Check labs con't meds      Relevant Medications   amLODipine (NORVASC) 10 MG tablet   atorvastatin (LIPITOR) 20 MG tablet   carvedilol (COREG) 12.5 MG tablet   Uncontrolled type 2 diabetes mellitus without complication, without long-term current use of insulin (HCC)    Check labs con't meds       Relevant Medications   atorvastatin (LIPITOR) 20 MG tablet   metFORMIN (GLUCOPHAGE-XR) 750 MG 24 hr tablet    Other Visit Diagnoses    Preventative health care       Relevant Orders   Hemoglobin A1c (Completed)   Comprehensive metabolic panel (Completed)   CBC (Completed)   Lipid panel (Completed)      I am having Mr. Triggs start on triamcinolone. I am also having him maintain his CINNAMON PO,  TRUE METRIX BLOOD GLUCOSE TEST, TRUEPLUS LANCETS 33G, TRUE METRIX AIR GLUCOSE METER, Turmeric, aspirin EC, ticagrelor, nitroGLYCERIN, amLODipine, atorvastatin, carvedilol, and metFORMIN.  Meds ordered this encounter  Medications  . triamcinolone (KENALOG) 0.025 % ointment    Sig: Apply 1 application topically 2 (two) times daily.    Dispense:  30 g    Refill:  0  . amLODipine (NORVASC) 10 MG tablet    Sig: Take 1 tablet (10 mg total) by mouth daily.    Dispense:  90 tablet    Refill:  1  . atorvastatin (LIPITOR) 20 MG tablet    Sig: Take 1 tablet (20 mg total) by mouth every evening.    Dispense:  90 tablet    Refill:  1  . carvedilol (COREG) 12.5 MG tablet      Sig: Take 1 tablet (12.5 mg total) by mouth 2 (two) times daily with a meal.    Dispense:  180 tablet    Refill:  2  . metFORMIN (GLUCOPHAGE-XR) 750 MG 24 hr tablet    Sig: Take 1 tablet (750 mg total) by mouth 2 (two) times daily.    Dispense:  180 tablet    Refill:  1    CMA served as scribe during this visit. History, Physical and Plan performed by medical provider. Documentation and orders reviewed and attested to.  Ann Held, DOPatient ID: Carma Lair, male   DOB: [birth year], 76 y.o.   MRN: [id number]

## 2016-03-29 NOTE — Patient Instructions (Signed)
Hip Pain The hip is the joint between the upper legs and the lower pelvis. The bones, cartilage, tendons, and muscles of your hip joint support your body and allow you to move around. Hip pain can range from a minor ache to severe pain in one or both of your hips. The pain may be felt on the inside of the hip joint near the groin, or the outside near the buttocks and upper thigh. You may also have swelling or stiffness. Follow these instructions at home: Managing pain, stiffness, and swelling   If directed, apply ice to the injured area.  Put ice in a plastic bag.  Place a towel between your skin and the bag.  Leave the ice on for 20 minutes, 2-3 times a day  Sleep with a pillow between your legs on your most comfortable side.  Avoid any activities that cause pain. General instructions   Take over-the-counter and prescription medicines only as told by your health care provider.  Do any exercises as told by your health care provider.  Record the following:  How often you have hip pain.  The location of your pain.  What the pain feels like.  What makes the pain worse.  Keep all follow-up visits as told by your health care provider. This is important. Contact a health care provider if:  You cannot put weight on your leg.  Your pain or swelling continues or gets worse after one week.  It gets harder to walk.  You have a fever. Get help right away if:  You fall.  You have a sudden increase in pain and swelling in your hip.  Your hip is red or swollen or very tender to touch. Summary  Hip pain can range from a minor ache to severe pain in one or both of your hips.  The pain may be felt on the inside of the hip joint near the groin, or the outside near the buttocks and upper thigh.  Avoid any activities that cause pain.  Record how often you have hip pain, the location of the pain, what makes it worse and what it feels like. This information is not intended to  replace advice given to you by your health care provider. Make sure you discuss any questions you have with your health care provider. Document Released: 07/05/2009 Document Revised: 12/19/2015 Document Reviewed: 12/19/2015 Elsevier Interactive Patient Education  2017 [employer] Inc.  

## 2016-03-29 NOTE — Assessment & Plan Note (Signed)
Per cardiology 

## 2016-03-29 NOTE — Assessment & Plan Note (Signed)
Stable con't meds 

## 2016-03-30 ENCOUNTER — Other Ambulatory Visit: Payer: Self-pay | Admitting: Family Medicine

## 2016-03-30 ENCOUNTER — Encounter: Payer: Self-pay | Admitting: Physician Assistant

## 2016-03-30 DIAGNOSIS — E785 Hyperlipidemia, unspecified: Secondary | ICD-10-CM

## 2016-03-30 DIAGNOSIS — K625 Hemorrhage of anus and rectum: Secondary | ICD-10-CM

## 2016-03-30 DIAGNOSIS — M5126 Other intervertebral disc displacement, lumbar region: Secondary | ICD-10-CM

## 2016-03-30 DIAGNOSIS — D649 Anemia, unspecified: Secondary | ICD-10-CM

## 2016-03-30 DIAGNOSIS — E119 Type 2 diabetes mellitus without complications: Secondary | ICD-10-CM

## 2016-03-30 MED FILL — TRIAMCINOLONE 0.025% OINT: 0.025 | 30 days supply | Qty: 30 | Fill #0

## 2016-04-09 NOTE — Progress Notes (Signed)
Cardiology Office Note    Date:  04/10/2016   ID:  Eddie, Mcdaniel 07-09-1940, MRN [id number]  PCP:  Ann Held, DO  Cardiologist:  Dr. Saunders Revel  CC: follow up  History of Present Illness:  Eddie Mcdaniel is a 76 y.o. male with a history of  type 2 diabetes mellitus, hypertension, hyperlipidemia, GERD and CAD s/p DES to LAD (10/2015) who presents to clinic for follow up.   He was admitted 10/19-10/20/17 for planned cath due to recent angina pectoris and abnormal stress test. LHC 11/18/15 showed diffusely disease LAD s/p DES to LAD, mod-severe ostial diagonal disease with tiny diag jailed by stent - given small size, PCI not performed, mod nonobstructive mRCA disease, EF 55-65%. He had itching post-cath which was relieved by Benadryl, felt to represent a delayed contrast media allergy. He had no respiratory difficulty or apparent rash. The patient was instructed in the future to notify his doctor or team if he requires any further procedures with contrast dye as he will require pre-medication. He was started on Brilinta.   He recently called the office because the Brilinta was too expensive and his insurance company denied patient assistance. I discussed with Dr. Saunders Revel who was okay with switching him to plavix.  Today he presents to clinic for follow up. They were recently were in [place] for a few months. He did not have a great trip because he has had very bad hip pain. He was seen by two Drs in [place] who have prescribed PT and medication. He is not able to walk more than 2-3 minutes due to hip pain. No CP or SOB. No LE edema, orthopnea or PND. No dizziness or syncope. No blood in stool or urine. No palpitations. Recently saw PCP who noted mild anemia H/H 11.6/34.1. No bleeding that he knows of. He brought in a BP log today which shows BPs ranging from 118/62-151/71. He was going to switch from Brilinta to plavix due to cost but his family members in medicine have convinced him to  stay on Brilinta.     Past Medical History:  Diagnosis Date  . Contrast media allergy   . Coronary artery disease    a. [place]/abnl nuc s/p  LHC 11/18/15 showed diffusely disease LAD s/p DES to LAD, mod-severe ostial diagonal disease with tiny diag jailed by stent - given small size, PCI not performed, mod nonobstructive mRCA disease, EF 55-65%.  [place] GERD (gastroesophageal reflux disease)   . Hx of adenomatous polyp of colon 10/06/2015  . Hyperlipidemia   . Hypertension   . Type II diabetes mellitus ([place])     Past Surgical History:  Procedure Laterality Date  . CARDIAC CATHETERIZATION N/A 11/17/2015   Procedure: Left Heart Cath and Coronary Angiography;  Surgeon: Nelva Bush, MD;  Location: [facility];  Service: Cardiovascular;  Laterality: N/A;  . CARDIAC CATHETERIZATION N/A 11/17/2015   Procedure: Coronary Stent Intervention;  Surgeon: Nelva Bush, MD;  Location: [facility];  Service: Cardiovascular;  Laterality: N/A;  . CATARACT EXTRACTION W/ INTRAOCULAR LENS  IMPLANT, BILATERAL Bilateral 2005  . CORONARY ANGIOPLASTY    . SHOULDER ARTHROSCOPY W/ ROTATOR CUFF REPAIR Right 2000s X 1  . SHOULDER OPEN ROTATOR CUFF REPAIR Right 2000s X 1   "after 1st OR didn't work; had to put tissue in this time"    Current Medications: Outpatient Medications Prior to Visit  Medication Sig Dispense Refill  . amLODipine (NORVASC) 10 MG tablet Take  1 tablet (10 mg total) by mouth daily. 90 tablet 1  . aspirin EC 81 MG tablet Take 1 tablet (81 mg total) by mouth daily. 90 tablet 3  . atorvastatin (LIPITOR) 20 MG tablet Take 1 tablet (20 mg total) by mouth every evening. 90 tablet 1  . Blood Glucose Monitoring Suppl (TRUE METRIX AIR GLUCOSE METER) W/DEVICE KIT 1 Device by Does not apply route daily. Check blood sugar twice daily. Dx. E11.9 1 kit 0  . carvedilol (COREG) 12.5 MG tablet Take 1 tablet (12.5 mg total) by mouth 2 (two) times daily with a meal. 180 tablet 2  . CINNAMON PO  Take by mouth See admin instructions. Takes "half a spoonful" once a day.    . metFORMIN (GLUCOPHAGE-XR) 750 MG 24 hr tablet Take 1 tablet (750 mg total) by mouth 2 (two) times daily. 180 tablet 1  . ticagrelor (BRILINTA) 90 MG TABS tablet Take 1 tablet (90 mg total) by mouth 2 (two) times daily. 180 tablet 3  . triamcinolone (KENALOG) 0.025 % ointment Apply 1 application topically 2 (two) times daily. 30 g 0  . TRUE METRIX BLOOD GLUCOSE TEST test strip Check blood sugar twice daily. Dx. E11.9 100 each 12  . TRUEPLUS LANCETS 33G MISC Check blood sugar twice daily. Dx. E11.9 100 each 12  . Turmeric POWD Take by mouth See admin instructions. Takes "half a spoonful" once a day.    . nitroGLYCERIN (NITROSTAT) 0.4 MG SL tablet Place 0.4 mg under the tongue every 5 (five) minutes x 3 doses as needed for chest pain. Call 911 at 3rd dose     No facility-administered medications prior to visit.      Allergies:   Ace inhibitors and Contrast media [iodinated diagnostic agents]   Social History   Social History  . Marital status: Married    Spouse name: N/A  . Number of children: N/A  . Years of education: N/A   Occupational History  . retired    Social History Main Topics  . Smoking status: Never Smoker  . Smokeless tobacco: Former Systems developer    Types: Chew    Quit date: 04/30/2010  . Alcohol use No  . Drug use: No  . Sexual activity: No   Other Topics Concern  . None   Social History Narrative   Walking daily   yoga           Family History:  The patient's family history includes Cancer (age of onset: 67) in his brother; Diabetes in his brother and mother; Heart disease in his brother and brother; Hyperlipidemia in his brother; Hypertension in his brother and mother; Stroke in his brother.     ROS:   Please see the history of present illness.    ROS All other systems reviewed and are negative.   PHYSICAL EXAM:   VS:  BP 124/60   Pulse (!) 58   Ht '5\' 4"'  (1.626 m)   Wt 154 lb (69.9  kg)   SpO2 99%   BMI 26.43 kg/m    GEN: Well nourished, well developed, in no acute distress  HEENT: normal  Neck: no JVD, carotid bruits, or masses Cardiac:RRR; no murmurs, rubs, or gallops,no edema  Respiratory:  clear to auscultation bilaterally, normal work of breathing GI: soft, nontender, nondistended, + BS MS: no deformity or atrophy  Skin: warm and dry, no rash Neuro:  Alert and Oriented x 3, Strength and sensation are intact Psych: euthymic mood, full affect  Wt Readings from Last 3 Encounters:  04/10/16 154 lb (69.9 kg)  03/29/16 148 lb 12.8 oz (67.5 kg)  12/05/15 148 lb 6.4 oz (67.3 kg)      Studies/Labs Reviewed:   EKG:  EKG is NOT ordered today.   Recent Labs: 03/29/2016: ALT 15; BUN 12; Creatinine, Ser 0.79; Hemoglobin 11.6; Platelets 233.0; Potassium 4.2; Sodium 136   Lipid Panel    Component Value Date/Time   CHOL 103 03/29/2016 1025   TRIG 85.0 03/29/2016 1025   HDL 35.50 (L) 03/29/2016 1025   CHOLHDL 3 03/29/2016 1025   VLDL 17.0 03/29/2016 1025   LDLCALC 50 03/29/2016 1025    Additional studies/ records that were reviewed today include:  Cath 11/17/15 Conclusions: 1. Significant single vessel coronary artery disease with diffusely diseased LAD with long segment of severe stenosis up to 95% in the mid section. 2. Multiple small diagonal branches with moderate to severe ostial disease. One tiny diagonal that was jailed by the stent demonstrates TIMI 2 flow at the end of the procedure. Given its small size and lack of chest pain, intervention was not attempted on this vessel. 3. Moderate, nonobstructive disease involving the mid RCA. 4. Successful PCI to mid LAD with placement of an integrity resolute 2.5 x 30 mm drug-eluting stent (post dilated at high pressure with 2.75 mm [place] balloon) with 0% residual stenosis and TIMI-3 flow. 5. Normal LV systolic function. 6. Upper normal to mildly elevated left ventricular filling pressure (LVEDP 15-20  mmHg).  Recommendations: 1. Dual antiplatelet therapy for at least 6 months, ideally longer. Patient was loaded with ticagrelor during the procedure but could be switched to clopidogrel at the time of discharge or outpatient follow-up. 2. Aggressive secondary prevention. We will increase atorvastatin to 20 mg daily with plan to escalate further in the future. 3. Follow-up as an outpatient in 2-3 weeks to ensure that the patient is doing well before his planned trip to [place] next month.       ASSESSMENT & PLAN:   CAD s/p DES to LAD: continue ASA/Brilinta, statin and BB. Initially we were going to Brilinta to plavix due to cost, but he now prefers to stay on Brilinta after talking with some family members who are in medicine. Plan will be to stay on Brilinta until 10/2016 (1 year after stent placement) and then switch to plavix on continue on ASA only. Will defer to Dr. Saunders Revel.  DMT2: HgA1c 6.3. Continue current regimen  HTN: BP well controlled in office today. Reviewed home logs and BPs seem to be good in the outpatient setting as well. Continue current regimen  HLD: LDL 50. Continue statin.    Medication Adjustments/Labs and Tests Ordered: Current medicines are reviewed at length with the patient today.  Concerns regarding medicines are outlined above.  Medication changes, Labs and Tests ordered today are listed in the Patient Instructions below. Patient Instructions  Medication Instructions:  Your physician recommends that you continue on your current medications as directed. Please refer to the Current Medication list given to you today.  Labwork: None ordered  Testing/Procedures: None ordered  Follow-Up: Your physician wants you to follow-up in: [place] DR[address] END  You will receive a reminder letter in the mail two months in advance. If you don't receive a letter, please call our office to schedule the follow-up appointment.    Any Other Special Instructions Will Be  Listed Below (If Applicable).   If you need a refill on your cardiac medications  before your next appointment, please call your pharmacy.      Mable Fill, PA-C  04/10/2016 11:33 AM    [facility] [address], [place], [place]  [address] Phone: [phone]; Fax: [phone]

## 2016-04-10 ENCOUNTER — Encounter (INDEPENDENT_AMBULATORY_CARE_PROVIDER_SITE_OTHER): Payer: Self-pay

## 2016-04-10 ENCOUNTER — Ambulatory Visit (INDEPENDENT_AMBULATORY_CARE_PROVIDER_SITE_OTHER): Payer: Medicare HMO | Admitting: Physician Assistant

## 2016-04-10 ENCOUNTER — Encounter: Payer: Self-pay | Admitting: Physician Assistant

## 2016-04-10 ENCOUNTER — Other Ambulatory Visit: Payer: Self-pay | Admitting: *Deleted

## 2016-04-10 VITALS — BP 124/60 | HR 58 | Ht 64.0 in | Wt 154.0 lb

## 2016-04-10 DIAGNOSIS — Z9861 Coronary angioplasty status: Secondary | ICD-10-CM

## 2016-04-10 DIAGNOSIS — I2510 Atherosclerotic heart disease of native coronary artery without angina pectoris: Secondary | ICD-10-CM

## 2016-04-10 DIAGNOSIS — E118 Type 2 diabetes mellitus with unspecified complications: Secondary | ICD-10-CM

## 2016-04-10 DIAGNOSIS — E785 Hyperlipidemia, unspecified: Secondary | ICD-10-CM | POA: Diagnosis not present

## 2016-04-10 DIAGNOSIS — I10 Essential (primary) hypertension: Secondary | ICD-10-CM

## 2016-04-10 NOTE — Patient Instructions (Addendum)
Medication Instructions:  Your physician recommends that you continue on your current medications as directed. Please refer to the Current Medication list given to you today.  Labwork: None ordered  Testing/Procedures: None ordered  Follow-Up: Your physician wants you to follow-up in: [place] [address] END  You will receive a reminder letter in the mail two months in advance. If you don't receive a letter, please call our office to schedule the follow-up appointment.    Any Other Special Instructions Will Be Listed Below (If Applicable).   If you need a refill on your cardiac medications before your next appointment, please call your pharmacy.

## 2016-04-10 NOTE — Telephone Encounter (Signed)
Pt given 2 bottles of brilinta 90 mg, was denied PA

## 2016-04-23 ENCOUNTER — Other Ambulatory Visit: Payer: Self-pay | Admitting: Family Medicine

## 2016-04-23 DIAGNOSIS — Z6825 Body mass index (BMI) 25.0-25.9, adult: Secondary | ICD-10-CM | POA: Diagnosis not present

## 2016-04-23 DIAGNOSIS — E785 Hyperlipidemia, unspecified: Secondary | ICD-10-CM

## 2016-04-23 DIAGNOSIS — I10 Essential (primary) hypertension: Secondary | ICD-10-CM

## 2016-04-23 DIAGNOSIS — M48062 Spinal stenosis, lumbar region with neurogenic claudication: Secondary | ICD-10-CM | POA: Diagnosis not present

## 2016-04-23 DIAGNOSIS — E1165 Type 2 diabetes mellitus with hyperglycemia: Principal | ICD-10-CM

## 2016-04-23 DIAGNOSIS — IMO0001 Reserved for inherently not codable concepts without codable children: Secondary | ICD-10-CM

## 2016-04-23 DIAGNOSIS — M4316 Spondylolisthesis, lumbar region: Secondary | ICD-10-CM | POA: Diagnosis not present

## 2016-04-23 MED ORDER — AMLODIPINE BESYLATE 10 MG PO TABS: 10.0000 mg | ORAL_TABLET | Freq: Every day | ORAL | 1 refills | Status: DC

## 2016-04-23 MED ORDER — METFORMIN HCL ER 750 MG PO TB24: 750.0000 mg | ORAL_TABLET | Freq: Two times a day (BID) | ORAL | 1 refills | Status: DC

## 2016-04-23 MED ORDER — ATORVASTATIN CALCIUM 20 MG PO TABS
20.0000 mg | ORAL_TABLET | Freq: Every evening | ORAL | 1 refills | Status: DC
Start: 2016-04-23 — End: 2016-12-17

## 2016-04-23 MED ORDER — CARVEDILOL 12.5 MG PO TABS: 12.5000 mg | ORAL_TABLET | Freq: Two times a day (BID) | ORAL | 2 refills | Status: DC

## 2016-05-15 ENCOUNTER — Telehealth (HOSPITAL_COMMUNITY): Payer: Self-pay | Admitting: *Deleted

## 2016-05-15 NOTE — Telephone Encounter (Signed)
Opened in error

## 2016-06-04 DIAGNOSIS — H33322 Round hole, left eye: Secondary | ICD-10-CM | POA: Diagnosis not present

## 2016-06-04 DIAGNOSIS — H4312 Vitreous hemorrhage, left eye: Secondary | ICD-10-CM | POA: Diagnosis not present

## 2016-06-04 DIAGNOSIS — H35033 Hypertensive retinopathy, bilateral: Secondary | ICD-10-CM | POA: Diagnosis not present

## 2016-06-04 DIAGNOSIS — H43813 Vitreous degeneration, bilateral: Secondary | ICD-10-CM | POA: Diagnosis not present

## 2016-06-04 DIAGNOSIS — H524 Presbyopia: Secondary | ICD-10-CM | POA: Diagnosis not present

## 2016-06-05 ENCOUNTER — Ambulatory Visit (INDEPENDENT_AMBULATORY_CARE_PROVIDER_SITE_OTHER): Payer: Medicare HMO | Admitting: Family Medicine

## 2016-06-05 ENCOUNTER — Encounter: Payer: Self-pay | Admitting: Family Medicine

## 2016-06-05 VITALS — BP 126/60 | HR 63 | Temp 97.9°F | Resp 16 | Ht 64.0 in | Wt 150.2 lb

## 2016-06-05 DIAGNOSIS — R600 Localized edema: Secondary | ICD-10-CM | POA: Diagnosis not present

## 2016-06-05 MED ORDER — HYDROCHLOROTHIAZIDE 25 MG PO TABS: 25.0000 mg | ORAL_TABLET | Freq: Every day | ORAL | 3 refills | Status: DC

## 2016-06-05 NOTE — Patient Instructions (Signed)
Edema Edema is an abnormal buildup of fluids in your bodytissues. Edema is somewhatdependent on gravity to pull the fluid to the lowest place in your body. That makes the condition more common in the legs and thighs (lower extremities). Painless swelling of the feet and ankles is common and becomes more likely as you get older. It is also common in looser tissues, like around your eyes. When the affected area is squeezed, the fluid may move out of that spot and leave a dent for a few moments. This dent is called pitting. What are the causes? There are many possible causes of edema. Eating too much salt and being on your feet or sitting for a long time can cause edema in your legs and ankles. Hot weather may make edema worse. Common medical causes of edema include:  Heart failure.  Liver disease.  Kidney disease.  Weak blood vessels in your legs.  Cancer.  An injury.  Pregnancy.  Some medications.  Obesity.  What are the signs or symptoms? Edema is usually painless.Your skin may look swollen or shiny. How is this diagnosed? Your health care provider may be able to diagnose edema by asking about your medical history and doing a physical exam. You may need to have tests such as X-rays, an electrocardiogram, or blood tests to check for medical conditions that may cause edema. How is this treated? Edema treatment depends on the cause. If you have heart, liver, or kidney disease, you need the treatment appropriate for these conditions. General treatment may include:  Elevation of the affected body part above the level of your heart.  Compression of the affected body part. Pressure from elastic bandages or support stockings squeezes the tissues and forces fluid back into the blood vessels. This keeps fluid from entering the tissues.  Restriction of fluid and salt intake.  Use of a water pill (diuretic). These medications are appropriate only for some types of edema. They pull fluid  out of your body and make you urinate more often. This gets rid of fluid and reduces swelling, but diuretics can have side effects. Only use diuretics as directed by your health care provider.  Follow these instructions at home:  Keep the affected body part above the level of your heart when you are lying down.  Do not sit still or stand for prolonged periods.  Do not put anything directly under your knees when lying down.  Do not wear constricting clothing or garters on your upper legs.  Exercise your legs to work the fluid back into your blood vessels. This may help the swelling go down.  Wear elastic bandages or support stockings to reduce ankle swelling as directed by your health care provider.  Eat a low-salt diet to reduce fluid if your health care provider recommends it.  Only take medicines as directed by your health care provider. Contact a health care provider if:  Your edema is not responding to treatment.  You have heart, liver, or kidney disease and notice symptoms of edema.  You have edema in your legs that does not improve after elevating them.  You have sudden and unexplained weight gain. Get help right away if:  You develop shortness of breath or chest pain.  You cannot breathe when you lie down.  You develop pain, redness, or warmth in the swollen areas.  You have heart, liver, or kidney disease and suddenly get edema.  You have a fever and your symptoms suddenly get worse. This information is   not intended to replace advice given to you by your health care provider. Make sure you discuss any questions you have with your health care provider. Document Released: 01/15/2005 Document Revised: 06/23/2015 Document Reviewed: 11/07/2012 Elsevier Interactive Patient Education  2017 [employer]  

## 2016-06-05 NOTE — Progress Notes (Signed)
Patient ID: Eddie Mcdaniel, male   DOB: [birth year], 76 y.o.   MRN: [id number]    Subjective:  I acted as a Education administrator for Dr. Carollee Herter.  Eddie Mcdaniel, Eddie Mcdaniel   Patient ID: Eddie Mcdaniel, male    DOB: Jul 27, 1940, 75 y.o.   MRN: [id number]  Chief Complaint  Patient presents with  . Foot Swelling    both feet     HPI  Patient is in today both feet are swollen.  They have been swollen for 2 months.  Denies any pain. Has cardiologist in March and they said feet were normal.   It has gotten worse since then.  No calf pain ,  No cp , no sob.  + restless legs.     Patient Care Team: Carollee Herter, Alferd Apa, DO as PCP - General Rana Snare, MD as Consulting Physician (Urology) Gatha Mayer, MD as Consulting Physician (Gastroenterology)   Past Medical History:  Diagnosis Date  . Contrast media allergy   . Coronary artery disease    a. USA/abnl nuc s/p  LHC 11/18/15 showed diffusely disease LAD s/p DES to LAD, mod-severe ostial diagonal disease with tiny diag jailed by stent - given small size, PCI not performed, mod nonobstructive mRCA disease, EF 55-65%.  Marland Kitchen GERD (gastroesophageal reflux disease)   . Hx of adenomatous polyp of colon 10/06/2015  . Hyperlipidemia   . Hypertension   . Type II diabetes mellitus ([place])     Past Surgical History:  Procedure Laterality Date  . CARDIAC CATHETERIZATION N/A 11/17/2015   Procedure: Left Heart Cath and Coronary Angiography;  Surgeon: Nelva Bush, MD;  Location: [facility];  Service: Cardiovascular;  Laterality: N/A;  . CARDIAC CATHETERIZATION N/A 11/17/2015   Procedure: Coronary Stent Intervention;  Surgeon: Nelva Bush, MD;  Location: [facility];  Service: Cardiovascular;  Laterality: N/A;  . CATARACT EXTRACTION W/ INTRAOCULAR LENS  IMPLANT, BILATERAL Bilateral 2005  . CORONARY ANGIOPLASTY    . SHOULDER ARTHROSCOPY W/ ROTATOR CUFF REPAIR Right 2000s X 1  . SHOULDER OPEN ROTATOR CUFF REPAIR Right 2000s X 1   "after 1st OR  didn't work; had to put tissue in this time"    Family History  Problem Relation Age of Onset  . Diabetes Mother   . Hypertension Mother   . Cancer Brother 61    brain, stomach  . Hyperlipidemia Brother   . Heart disease Brother     MI  . Heart disease Brother     MI  . Diabetes Brother   . Stroke Brother   . Hypertension Brother   . Colon cancer Neg Hx   . Esophageal cancer Neg Hx   . Rectal cancer Neg Hx   . Stomach cancer Neg Hx     Social History   Social History  . Marital status: Married    Spouse name: N/A  . Number of children: N/A  . Years of education: N/A   Occupational History  . retired    Social History Main Topics  . Smoking status: Never Smoker  . Smokeless tobacco: Former Systems developer    Types: Chew    Quit date: 04/30/2010  . Alcohol use No  . Drug use: No  . Sexual activity: No   Other Topics Concern  . Not on file   Social History Narrative   Walking daily   yoga          Outpatient Medications Prior to Visit  Medication Sig Dispense Refill  .  amLODipine (NORVASC) 10 MG tablet Take 1 tablet (10 mg total) by mouth daily. 90 tablet 1  . aspirin EC 81 MG tablet Take 1 tablet (81 mg total) by mouth daily. 90 tablet 3  . atorvastatin (LIPITOR) 20 MG tablet Take 1 tablet (20 mg total) by mouth every evening. 90 tablet 1  . Blood Glucose Monitoring Suppl (TRUE METRIX AIR GLUCOSE METER) W/DEVICE KIT 1 Device by Does not apply route daily. Check blood sugar twice daily. Dx. E11.9 1 kit 0  . carvedilol (COREG) 12.5 MG tablet Take 1 tablet (12.5 mg total) by mouth 2 (two) times daily with a meal. 180 tablet 2  . CINNAMON PO Take by mouth See admin instructions. Takes "half a spoonful" once a day.    Marland Kitchen GARLIC PO Take 1 capsule by mouth daily.    . metFORMIN (GLUCOPHAGE-XR) 750 MG 24 hr tablet Take 1 tablet (750 mg total) by mouth 2 (two) times daily. 180 tablet 1  . nitroGLYCERIN (NITROSTAT) 0.4 MG SL tablet Place 0.4 mg under the tongue every 5 (five)  minutes x 3 doses as needed for chest pain. Call 911 at 3rd dose    . ticagrelor (BRILINTA) 90 MG TABS tablet Take 1 tablet (90 mg total) by mouth 2 (two) times daily. 180 tablet 3  . triamcinolone (KENALOG) 0.025 % ointment Apply 1 application topically 2 (two) times daily. 30 g 0  . TRUE METRIX BLOOD GLUCOSE TEST test strip Check blood sugar twice daily. Dx. E11.9 100 each 12  . TRUEPLUS LANCETS 33G MISC Check blood sugar twice daily. Dx. E11.9 100 each 12  . Turmeric POWD Take by mouth See admin instructions. Takes "half a spoonful" once a day.     No facility-administered medications prior to visit.     Allergies  Allergen Reactions  . Ace Inhibitors Anaphylaxis    angioedema  . Contrast Media [Iodinated Diagnostic Agents] Itching    Review of Systems  Constitutional: Negative for fever and malaise/fatigue.  HENT: Negative for congestion.   Eyes: Negative for blurred vision.  Respiratory: Negative for cough and shortness of breath.   Cardiovascular: Negative for chest pain, palpitations and leg swelling.       Both feet swollen   Gastrointestinal: Negative for vomiting.  Musculoskeletal: Negative for back pain.  Skin: Negative for rash.  Neurological: Negative for loss of consciousness and headaches.       Objective:    Physical Exam  Constitutional: He appears well-developed and well-nourished. No distress.  HENT:  Head: Normocephalic and atraumatic.  Eyes: Conjunctivae are normal.  Neck: Normal range of motion. No thyromegaly present.  Cardiovascular: Normal rate and regular rhythm.   Pulmonary/Chest: Effort normal. He has no wheezes.  Abdominal: Soft. Bowel sounds are normal. There is no tenderness.  Musculoskeletal: Normal range of motion. He exhibits edema. He exhibits no deformity.       Right ankle: He exhibits swelling.       Left ankle: He exhibits swelling.  Neurological: He is alert.  Skin: Skin is warm and dry. He is not diaphoretic.  Psychiatric: He  has a normal mood and affect.  Nursing note and vitals reviewed.   BP 126/60 (BP Location: Right Arm, Cuff Size: Normal)   Pulse 63   Temp 97.9 F (36.6 C) (Oral)   Resp 16   Ht '5\' 4"'$  (1.626 m)   Wt 150 lb 3.2 oz (68.1 kg)   SpO2 98%   BMI 25.78 kg/m  Wt Readings  from Last 3 Encounters:  06/05/16 150 lb 3.2 oz (68.1 kg)  04/10/16 154 lb (69.9 kg)  03/29/16 148 lb 12.8 oz (67.5 kg)   BP Readings from Last 3 Encounters:  06/05/16 126/60  04/10/16 124/60  03/29/16 (!) 112/58     Immunization History  Administered Date(s) Administered  . Influenza Split 10/30/2010, 12/21/2011  . Influenza Whole 11/18/2009  . Influenza, High Dose Seasonal PF 02/28/2015, 10/25/2015  . Influenza,inj,Quad PF,36+ Mos 03/25/2013, 12/17/2013  . Pneumococcal Conjugate-13 09/08/2013  . Pneumococcal Polysaccharide-23 10/24/2006, 10/27/2015    Health Maintenance  Topic Date Due  . Janet Berlin  05/[birth year]/1961  . FOOT EXAM  09/09/2014  . URINE MICROALBUMIN  02/28/2016  . OPHTHALMOLOGY EXAM  10/24/2016 (Originally 08/06/2015)  . INFLUENZA VACCINE  08/29/2016  . HEMOGLOBIN A1C  09/29/2016  . COLONOSCOPY  09/30/2018  . PNA vac Low Risk Adult  Completed    Lab Results  Component Value Date   WBC 9.5 03/29/2016   HGB 11.6 (L) 03/29/2016   HCT 34.1 (L) 03/29/2016   PLT 233.0 03/29/2016   GLUCOSE 121 (H) 03/29/2016   CHOL 103 03/29/2016   TRIG 85.0 03/29/2016   HDL 35.50 (L) 03/29/2016   LDLCALC 50 03/29/2016   ALT 15 03/29/2016   AST 17 03/29/2016   NA 136 03/29/2016   K 4.2 03/29/2016   CL 102 03/29/2016   CREATININE 0.79 03/29/2016   BUN 12 03/29/2016   CO2 29 03/29/2016   PSA 7.26 (H) 08/19/2014   INR 1.1 11/11/2015   HGBA1C 6.3 03/29/2016   MICROALBUR 1.3 02/28/2015    No results found for: TSH Lab Results  Component Value Date   WBC 9.5 03/29/2016   HGB 11.6 (L) 03/29/2016   HCT 34.1 (L) 03/29/2016   MCV 89.5 03/29/2016   PLT 233.0 03/29/2016   Lab Results  Component Value  Date   NA 136 03/29/2016   K 4.2 03/29/2016   CO2 29 03/29/2016   GLUCOSE 121 (H) 03/29/2016   BUN 12 03/29/2016   CREATININE 0.79 03/29/2016   BILITOT 0.5 03/29/2016   ALKPHOS 42 03/29/2016   AST 17 03/29/2016   ALT 15 03/29/2016   PROT 7.3 03/29/2016   ALBUMIN 4.3 03/29/2016   CALCIUM 9.6 03/29/2016   ANIONGAP 5 11/18/2015   GFR 101.94 03/29/2016   Lab Results  Component Value Date   CHOL 103 03/29/2016   Lab Results  Component Value Date   HDL 35.50 (L) 03/29/2016   Lab Results  Component Value Date   LDLCALC 50 03/29/2016   Lab Results  Component Value Date   TRIG 85.0 03/29/2016   Lab Results  Component Value Date   CHOLHDL 3 03/29/2016   Lab Results  Component Value Date   HGBA1C 6.3 03/29/2016         Assessment & Plan:   Problem List Items Addressed This Visit      Unprioritized   Lower extremity edema - Primary    hctz  May be from norvasc Elevated legs  Compression socks f/u 2-3 weeks        Relevant Medications   hydrochlorothiazide (HYDRODIURIL) 25 MG tablet      I am having Mr. Thieme start on hydrochlorothiazide. I am also having him maintain his CINNAMON PO, TRUE METRIX BLOOD GLUCOSE TEST, TRUEPLUS LANCETS 33G, TRUE METRIX AIR GLUCOSE METER, Turmeric, aspirin EC, ticagrelor, nitroGLYCERIN, triamcinolone, GARLIC PO, metFORMIN, carvedilol, atorvastatin, and amLODipine.  Meds ordered this encounter  Medications  . hydrochlorothiazide (HYDRODIURIL)  25 MG tablet    Sig: Take 1 tablet (25 mg total) by mouth daily.    Dispense:  90 tablet    Refill:  3    CMA served as scribe during this visit. History, Physical and Plan performed by medical provider. Documentation and orders reviewed and attested to.  Ann Held, DO

## 2016-06-05 NOTE — Assessment & Plan Note (Signed)
hctz  May be from norvasc Elevated legs  Compression socks f/u 2-3 weeks

## 2016-06-05 NOTE — Progress Notes (Signed)
Pre visit review using our clinic review tool, if applicable. No additional management support is needed unless otherwise documented below in the visit note. 

## 2016-06-06 ENCOUNTER — Telehealth: Payer: Self-pay | Admitting: Family Medicine

## 2016-06-06 NOTE — Telephone Encounter (Signed)
Pt brought forms for Carollee Herter to fill out. Medication assistance application [employer]. Pt has forms and his income taxes attached. Please let pt know when forms complete. Pt [phone]. Put forms and attached taxes in front office bin. cb

## 2016-06-07 ENCOUNTER — Telehealth: Payer: Self-pay | Admitting: *Deleted

## 2016-06-07 NOTE — Telephone Encounter (Signed)
Received Patient Assistance paperwork for Brilinta, forwarded to provider/SLS 05/10

## 2016-06-13 ENCOUNTER — Telehealth: Payer: Self-pay | Admitting: Physician Assistant

## 2016-06-13 DIAGNOSIS — Z79899 Other long term (current) drug therapy: Secondary | ICD-10-CM

## 2016-06-13 NOTE — Telephone Encounter (Signed)
New message     Pt c/o swelling: STAT is pt has developed SOB within 24 hours  1. How long have you been experiencing swelling? Since march  2. Where is the swelling located? Leg   3.  Are you currently taking a "fluid pill"? yes  4.  Are you currently SOB? no   hasnt done any traveling lately

## 2016-06-13 NOTE — Telephone Encounter (Signed)
Patient's son calling back and states that he works in a cardiology office in [place]. He states that the swelling has been in his father's lower extremities since March. He states that his father denies any chest pain, SOB, leg pain, weight gain, or any other symptoms. The son thinks the patient's feet swelling is related to the amlodipine and wants to know if he can be switched to another medication.  Son states to call him back with recommendations and he can relay the information to his father.

## 2016-06-13 NOTE — Telephone Encounter (Signed)
Follow Up:; ° ° °Returning your call. °

## 2016-06-13 NOTE — Telephone Encounter (Signed)
Patient calling and states that he has been having swelling in his lower extremities since March. Patient denies any chest pain, SOB, leg pain, weight gain, or any other symptoms. Patient states that he does not take any extra salt in his diet. Patient states that his PCP recently prescribed HCTZ 25mg  QD on 06/05/16 and there has been no improvement in his swelling.   Patient states that his son has some questions and wanted me to call and speak to his him at [phone]. Attempted to call the son, but there was no answer. Left message for son to call back.

## 2016-06-14 MED ORDER — LOSARTAN POTASSIUM 25 MG PO TABS: 25.0000 mg | ORAL_TABLET | Freq: Every day | ORAL | 0 refills | Status: DC

## 2016-06-14 NOTE — Telephone Encounter (Signed)
I was unaware that the patient was having LE edema. We can stop amlodipine 10mg  daily and start Losartan 25 mg daily. Lets get him a HTN clinic appointment in 1-2 weeks with a BMET for follow up. This may need to be increased if BP uncontrolled. This is a better medication for him anyway given his diabetes to protect his kidneys

## 2016-06-14 NOTE — Telephone Encounter (Signed)
Son returned my call.  He has been made aware that we will d/c Amlodipine to see if that is causing pts swelling in the legs. We will start Losartan 25 mqd and have pt come in for BP check and BMET in 1 week, with possible med titration.  Son advised that he will have to have the pt call tomorrow to schedule the BP visit.

## 2016-06-15 ENCOUNTER — Telehealth: Payer: Self-pay | Admitting: Internal Medicine

## 2016-06-15 NOTE — Telephone Encounter (Signed)
New Message  Pt voiced wanting someone to give him a call pt did not want to go into details.

## 2016-06-15 NOTE — Telephone Encounter (Signed)
Pt calling in to report that he having to leave in 2 days to go out of state.  He will be gone till first of July.  Pt would like to wait to change medications until he returns.  Reports BP this morning 110/64, right foot is normal, left foot only slightly swollen. Pt will call when he returns in July to make changes (see 5/16 telephone note for instructions)  Will route to [place], RN for her FYI.

## 2016-06-18 NOTE — Telephone Encounter (Signed)
See phone note dated 06/13/16, will forward to [place], [place].

## 2016-06-18 NOTE — Telephone Encounter (Signed)
That is fine. Thank you for the update.

## 2016-07-05 ENCOUNTER — Telehealth: Payer: Self-pay | Admitting: Family Medicine

## 2016-07-05 NOTE — Telephone Encounter (Signed)
Received Patient Assistance paperwork for Brilinta, forwarded to provider/SLS 06/07/16. PT Called today to check if this paperwork was completed and sent for pt to have fin asst for this medication. Please verify paperwork processed and contact pt.

## 2016-07-06 NOTE — Telephone Encounter (Signed)
Forms completed.  Scanned and emailed to patient.

## 2016-07-30 ENCOUNTER — Encounter: Payer: Self-pay | Admitting: Internal Medicine

## 2016-07-31 ENCOUNTER — Telehealth: Payer: Self-pay | Admitting: Internal Medicine

## 2016-07-31 NOTE — Telephone Encounter (Signed)
Follow up       Pt is in dentist chair to get root cannel.  See previous note

## 2016-07-31 NOTE — Telephone Encounter (Signed)
Call transferred to this nurse.  Dentist nurse Jessica Priest states Pt is in dental chair needing urgent root canal.  Dentist will not proceed without clearance d/t Pt on Brilinta.  Notified nurse that message has been sent to Pt's doctor.  Notified nurse that this nurse will attempt to contact his nurse, but there may be a delay.  Dental nurse states can Pt schedule appt for Thursday.  Again, notified nurse will attempt to get clearance, but there may be a delay.  Dental nurse requested my name and #.   Phone conversation ended.   Spoke with pharmacist.  Per pharmacist, Pattricia Boss does not need to be held prior to a root canal.  Message sent to Dr. Saunders Revel.  Await response.

## 2016-07-31 NOTE — Telephone Encounter (Signed)
Response received from Dr. Saunders Revel.  Dr. Virgel Gess clearance yesterday.  Reprinted letter.  Refaxed letter.  Call placed to dental nurse.  Verified refaxed.  Per nurse, she just received it.  No further action needed.

## 2016-07-31 NOTE — Telephone Encounter (Signed)
New Message     1. What dental office are you calling from? [facility]   2. What is your office phone and fax number?  [facility] fax [phone]  3. What type of procedure is the patient having performed? Root canal therapy   4. What date is procedure scheduled?  Not schedule yet    5. What is your question (ex. Antibiotics prior to procedure, holding medication-we need to know how long dentist wants pt to hold med)? Pt is on Brillinta , how long does pt need be off this medication and does patient need to be premed?

## 2016-08-06 ENCOUNTER — Telehealth: Payer: Self-pay | Admitting: Family Medicine

## 2016-08-06 NOTE — Telephone Encounter (Signed)
This patient arrived in the office today 08/06/16 stating someone had called him to pickup his medication from patient assistance.  There is no documentation that anyone had called him.  Could you followup with him

## 2016-08-06 NOTE — Telephone Encounter (Signed)
Caller name: ABDULLA POOLEY Relation to pt: self  Call back number: [phone] [facility]   Reason for call: Pt came in office stating that his rx for Brilinta 90 mg was here, but Pt states there was paperwork done for the rx since May but pt does not know where his rx for Brilinta was sent too since pt was out of town ([place]). Pt would like to know where is his rx sent to and if not pt does need rx asap. Please advise.

## 2016-08-07 ENCOUNTER — Telehealth: Payer: Self-pay | Admitting: Physician Assistant

## 2016-08-07 NOTE — Telephone Encounter (Signed)
Pt's medication is not here in office.  Called [employer] to follow up on patient's application.  Was unable to speak to a representative.  No representatives were available.  Was asked to leave a call back number for call back.  Rep stated that she would put in a message so that nurse may hear back from assistance program by 5 pm today.  Awaiting call back.    Called patient to make him aware and advised that nurse would call him back with an update.  Pt stated he has a least 6-7 days of medication left.

## 2016-08-07 NOTE — Telephone Encounter (Signed)
Called patient and made him aware.  He stated understanding.  No additional questions or concerns voiced at this time.

## 2016-08-07 NOTE — Telephone Encounter (Signed)
Received call back from [facility].  Rep stated that they received patient's application on July 09, 2016.  Application was reviewed.  Then on July 11, 2016 a letter was sent to the patient requesting additional information.  Per the rep they needed proof of out of pocket expenses for patient's medications from this calendar.  She stated that with him being married, he could also send in his wife's out pocket expenses if they have Medicare Part D.  The total expense should be at least 3% of patient's income (which would be $866.49).    Called patient and made him aware.  Pt stated that because all his other medications are generic, he does not have to pay out of pocket for medications.  He said Brilinta would cost him $221 dollars for a 90 day supply and he says that's too expensive for him.  Pt stated he would prefer to stay on Brilinta and not be switched to a generic because it works best for his heart.  Pt states he will be out of medication in 7 days and does not know what do.    Please advise.

## 2016-08-07 NOTE — Telephone Encounter (Signed)
We can switch him to plavix 75mg  daily if he cannot afford Brilinta and cannot get assistance.

## 2016-08-07 NOTE — Telephone Encounter (Signed)
We were helping with paperwork but cardiology is prescribing it-- he needs to discuss it with cardiology

## 2016-08-07 NOTE — Telephone Encounter (Signed)
Pt c/o medication issue:  1. Name of Medication: amlodipine besylate and  hydrochlorophiazide   2. How are you currently taking this medication (dosage and times per day)? no  3. Are you having a reaction (difficulty breathing--STAT)? no  4. What is your medication issue? Needs more instrutions

## 2016-08-07 NOTE — Telephone Encounter (Addendum)
Please see telephone note dated 08/06/16.

## 2016-08-07 NOTE — Telephone Encounter (Signed)
Message routed to Cardiology.

## 2016-08-08 ENCOUNTER — Other Ambulatory Visit: Payer: Self-pay

## 2016-08-08 NOTE — Telephone Encounter (Signed)
Patient had questions about his medications. Patient was to stop taking amlodipine, start losartan 25 mg daily, and have lab work in one week after starting medication. Patient has not started his losartan yet, so he will start today. Scheduled patient to have lab work on Wednesday of next week. Patient verbalized understanding.

## 2016-08-09 ENCOUNTER — Telehealth: Payer: Self-pay | Admitting: *Deleted

## 2016-08-09 ENCOUNTER — Other Ambulatory Visit: Payer: Self-pay | Admitting: *Deleted

## 2016-08-09 MED ORDER — TICAGRELOR 90 MG PO TABS: 90.0000 mg | ORAL_TABLET | Freq: Two times a day (BID) | ORAL | 0 refills | Status: DC

## 2016-08-09 NOTE — Telephone Encounter (Signed)
Patient presented to the office requesting a refill on brilinta be sent to [facility]. He has refills with [facility], but he is now using [employer]. He also requested samples to get him through until he receives his medication. Samples provided and rx sent as requested. I spoke with Angelena Form, PA as I saw the phone note from 08/07/16 stating that if he could not afford the medication he could be switched to clopidogrel. She stated that if he wants to stay on the brilinta that was okay. I will only send one ninety day supply as per office notes, he will remain on this medication until 10/2016.

## 2016-08-15 ENCOUNTER — Other Ambulatory Visit: Payer: Medicare HMO | Admitting: *Deleted

## 2016-08-15 ENCOUNTER — Encounter (INDEPENDENT_AMBULATORY_CARE_PROVIDER_SITE_OTHER): Payer: Self-pay

## 2016-08-15 DIAGNOSIS — Z79899 Other long term (current) drug therapy: Secondary | ICD-10-CM | POA: Diagnosis not present

## 2016-08-15 LAB — BASIC METABOLIC PANEL
BUN/Creatinine Ratio: 11 (ref 10–24)
BUN: 10 mg/dL (ref 8–27)
CALCIUM: 9.5 mg/dL (ref 8.6–10.2)
CO2: 22 mmol/L (ref 20–29)
Chloride: 95 mmol/L — ABNORMAL LOW (ref 96–106)
Creatinine, Ser: 0.92 mg/dL (ref 0.76–1.27)
GFR calc Af Amer: 94 mL/min/{1.73_m2} (ref >59)
GFR calc non Af Amer: 82 mL/min/{1.73_m2} (ref >59)
GLUCOSE: 199 mg/dL — AB (ref 65–99)
POTASSIUM: 4.1 mmol/L (ref 3.5–5.2)
Sodium: 134 mmol/L (ref 134–144)

## 2016-09-26 ENCOUNTER — Telehealth: Payer: Self-pay | Admitting: Family Medicine

## 2016-09-26 DIAGNOSIS — Z87898 Personal history of other specified conditions: Secondary | ICD-10-CM

## 2016-09-26 NOTE — Telephone Encounter (Signed)
PSA ordered

## 2016-09-26 NOTE — Telephone Encounter (Signed)
°  Relation to pt: self  Call back number:[phone] Pharmacy:  Reason for call:  Patient has a scheduled lab appointment only for 10/03/16, patient requesting PSA orders as well, please advise

## 2016-10-03 ENCOUNTER — Other Ambulatory Visit (INDEPENDENT_AMBULATORY_CARE_PROVIDER_SITE_OTHER): Payer: Medicare HMO

## 2016-10-03 DIAGNOSIS — E119 Type 2 diabetes mellitus without complications: Secondary | ICD-10-CM | POA: Diagnosis not present

## 2016-10-03 DIAGNOSIS — D649 Anemia, unspecified: Secondary | ICD-10-CM | POA: Diagnosis not present

## 2016-10-03 DIAGNOSIS — E785 Hyperlipidemia, unspecified: Secondary | ICD-10-CM | POA: Diagnosis not present

## 2016-10-03 DIAGNOSIS — Z87898 Personal history of other specified conditions: Secondary | ICD-10-CM

## 2016-10-03 LAB — CBC WITH DIFFERENTIAL/PLATELET
Basophils Absolute: 0 10*3/uL (ref 0.0–0.1)
Basophils Relative: 0.5 % (ref 0.0–3.0)
EOS PCT: 4.3 % (ref 0.0–5.0)
Eosinophils Absolute: 0.3 10*3/uL (ref 0.0–0.7)
HEMATOCRIT: 31.7 % — AB (ref 39.0–52.0)
HEMOGLOBIN: 10.1 g/dL — AB (ref 13.0–17.0)
LYMPHS ABS: 1.7 10*3/uL (ref 0.7–4.0)
LYMPHS PCT: 22.4 % (ref 12.0–46.0)
MCHC: 32 g/dL (ref 30.0–36.0)
MCV: 79 fl (ref 78.0–100.0)
MONOS PCT: 7.7 % (ref 3.0–12.0)
Monocytes Absolute: 0.6 10*3/uL (ref 0.1–1.0)
NEUTROS PCT: 65.1 % (ref 43.0–77.0)
Neutro Abs: 5 10*3/uL (ref 1.4–7.7)
Platelets: 193 10*3/uL (ref 150.0–400.0)
RBC: 4.01 Mil/uL — AB (ref 4.22–5.81)
RDW: 15.1 % (ref 11.5–15.5)
WBC: 7.7 10*3/uL (ref 4.0–10.5)

## 2016-10-03 LAB — LIPID PANEL
CHOLESTEROL: 101 mg/dL (ref 0–200)
HDL: 31.7 mg/dL — ABNORMAL LOW (ref >39.00)
LDL CALC: 47 mg/dL (ref 0–99)
NonHDL: 69.71
TRIGLYCERIDES: 112 mg/dL (ref 0.0–149.0)
Total CHOL/HDL Ratio: 3
VLDL: 22.4 mg/dL (ref 0.0–40.0)

## 2016-10-03 LAB — COMPREHENSIVE METABOLIC PANEL
ALBUMIN: 4.1 g/dL (ref 3.5–5.2)
ALT: 14 U/L (ref 0–53)
AST: 15 U/L (ref 0–37)
Alkaline Phosphatase: 40 U/L (ref 39–117)
BUN: 9 mg/dL (ref 6–23)
CHLORIDE: 101 meq/L (ref 96–112)
CO2: 28 mEq/L (ref 19–32)
Calcium: 9.8 mg/dL (ref 8.4–10.5)
Creatinine, Ser: 0.8 mg/dL (ref 0.40–1.50)
GFR: 100.33 mL/min (ref >60.00)
Glucose, Bld: 136 mg/dL — ABNORMAL HIGH (ref 70–99)
POTASSIUM: 4.9 meq/L (ref 3.5–5.1)
SODIUM: 137 meq/L (ref 135–145)
Total Bilirubin: 0.5 mg/dL (ref 0.2–1.2)
Total Protein: 6.6 g/dL (ref 6.0–8.3)

## 2016-10-03 LAB — HEMOGLOBIN A1C: HEMOGLOBIN A1C: 7.4 % — AB (ref 4.6–6.5)

## 2016-10-03 LAB — PSA: PSA: 4.81 ng/mL — ABNORMAL HIGH (ref 0.10–4.00)

## 2016-10-04 ENCOUNTER — Telehealth: Payer: Self-pay | Admitting: Family Medicine

## 2016-10-04 NOTE — Telephone Encounter (Signed)
Pt had labs completed yesterday, pt would like to have his results mailed to him at him home address that on file.

## 2016-10-04 NOTE — Telephone Encounter (Signed)
Pt would like labs mailed to him anything you would like to document on them

## 2016-10-05 NOTE — Telephone Encounter (Signed)
See labs 

## 2016-10-05 NOTE — Telephone Encounter (Signed)
Pt was made aware of results per nurse. Labs printed and placed in outgoing mail. Nothing further is needed

## 2016-10-07 NOTE — Progress Notes (Addendum)
Follow-up Outpatient Visit Date: 10/08/2016  Primary Care Provider: Ann Held, DO [address] [place] [address]  Chief Complaint: Leg pain  HPI:  Mr. Eissler is a 76 y.o. year-old male with history of coronary artery disease status post PCI to mid LAD in 10/2015, type 2 diabetes mellitus, hypertension, hyperlipidemia, and GERD, who presents for follow-up of coronary artery disease. He was last seen by Nell Range, PA, on 04/11/15, at which time he was doing well with the exception of hip pain that limited his mobility. Today, Mr. Madlock reports that he is still plagued by right hip and left knee pain. He has consulted with a neurologist (or neurosurgeon) and was informed of spinal stenosis. He is deferring intervention at this time but may require injections at some point. He is also scheduled to see an orthopedist for evaluation of his left knee pain in the near future. His back and joint problems have limited his mobility, though he still tries to walk about a mile a day. He has not had any chest pain, shortness of breath, palpitations, or lightheadedness. He remains on dual antiplatelet therapy with aspirin and ticagrelor without bleeding. However, he was recently noted to have mild anemia and is currently completing stool cards to exclude occult rectal bleeding.  Mr. Pepperman notes occasional swelling of the left leg, which was most pronounced during and after his visit to [place] last winter. He was started on hydrochlorothiazide with some improvement. He does not have any significant edema today. However, he is concerned about elevated blood sugars with the addition of HCTZ. His wife also notes that Mr. Brammer has been snoring and may have apneic spells at night. He typically feels well rested, though he wakes up several times during the night to use the bathroom. Mr. Kaczmarek has never undergone a sleep study, though this was recommended by his son (physician in [place]).  -------------------------------------------------------------------------------------------------- Past Medical History:  Diagnosis Date  . Contrast media allergy   . Coronary artery disease    a. USA/abnl nuc s/p  LHC 11/18/15 showed diffusely disease LAD s/p DES to LAD, mod-severe ostial diagonal disease with tiny diag jailed by stent - given small size, PCI not performed, mod nonobstructive mRCA disease, EF 55-65%.  Marland Kitchen GERD (gastroesophageal reflux disease)   . Hx of adenomatous polyp of colon 10/06/2015  . Hyperlipidemia   . Hypertension   . Type II diabetes mellitus ([place])    Past Surgical History:  Procedure Laterality Date  . CARDIAC CATHETERIZATION N/A 11/17/2015   Procedure: Left Heart Cath and Coronary Angiography;  Surgeon: Nelva Bush, MD;  Location: [facility];  Service: Cardiovascular;  Laterality: N/A;  . CARDIAC CATHETERIZATION N/A 11/17/2015   Procedure: Coronary Stent Intervention;  Surgeon: Nelva Bush, MD;  Location: [facility];  Service: Cardiovascular;  Laterality: N/A;  . CATARACT EXTRACTION W/ INTRAOCULAR LENS  IMPLANT, BILATERAL Bilateral 2005  . CORONARY ANGIOPLASTY    . SHOULDER ARTHROSCOPY W/ ROTATOR CUFF REPAIR Right 2000s X 1  . SHOULDER OPEN ROTATOR CUFF REPAIR Right 2000s X 1   "after 1st OR didn't work; had to put tissue in this time"    Current Meds  Medication Sig  . aspirin EC 81 MG tablet Take 1 tablet (81 mg total) by mouth daily.  Marland Kitchen atorvastatin (LIPITOR) 20 MG tablet Take 1 tablet (20 mg total) by mouth every evening.  . Blood Glucose Monitoring Suppl (TRUE METRIX AIR  GLUCOSE METER) W/DEVICE KIT 1 Device by Does not apply route daily. Check blood sugar twice daily. Dx. E11.9  . carvedilol (COREG) 12.5 MG tablet Take 1 tablet (12.5 mg total) by mouth 2 (two) times daily with a meal.  . CINNAMON PO Take by mouth See admin instructions. Takes "half a spoonful" once a day.  Marland Kitchen GARLIC PO Take 1 capsule by mouth daily.    . hydrochlorothiazide (HYDRODIURIL) 25 MG tablet Take 1 tablet (25 mg total) by mouth daily.  Marland Kitchen losartan (COZAAR) 25 MG tablet Take 1 tablet (25 mg total) by mouth daily.  . metFORMIN (GLUCOPHAGE-XR) 750 MG 24 hr tablet Take 1 tablet (750 mg total) by mouth 2 (two) times daily.  . nitroGLYCERIN (NITROSTAT) 0.4 MG SL tablet Place 0.4 mg under the tongue every 5 (five) minutes x 3 doses as needed for chest pain. Call 911 at 3rd dose  . ticagrelor (BRILINTA) 90 MG TABS tablet Take 1 tablet (90 mg total) by mouth 2 (two) times daily.  Marland Kitchen triamcinolone (KENALOG) 0.025 % ointment Apply 1 application topically 2 (two) times daily.  . TRUE METRIX BLOOD GLUCOSE TEST test strip Check blood sugar twice daily. Dx. E11.9  . TRUEPLUS LANCETS 33G MISC Check blood sugar twice daily. Dx. E11.9  . Turmeric POWD Take by mouth See admin instructions. Takes "half a spoonful" once a day.    Allergies: Ace inhibitors and Contrast media [iodinated diagnostic agents]  Social History   Social History  . Marital status: Married    Spouse name: N/A  . Number of children: N/A  . Years of education: N/A   Occupational History  . retired    Social History Main Topics  . Smoking status: Never Smoker  . Smokeless tobacco: Former Neurosurgeon    Types: Chew    Quit date: 04/30/2010  . Alcohol use No  . Drug use: No  . Sexual activity: No   Other Topics Concern  . Not on file   Social History Narrative   Walking daily   yoga          Family History  Problem Relation Age of Onset  . Diabetes Mother   . Hypertension Mother   . Cancer Brother 65       brain, stomach  . Hyperlipidemia Brother   . Heart disease Brother        MI  . Heart disease Brother        MI  . Diabetes Brother   . Stroke Brother   . Hypertension Brother   . Colon cancer Neg Hx   . Esophageal cancer Neg Hx   . Rectal cancer Neg Hx   . Stomach cancer Neg Hx     Review of Systems: Patient continues to have left-sided jaw pain  require additional dental procedures in the near future. Otherwise, a 12-system review of systems was performed and was negative except as noted in the HPI.  --------------------------------------------------------------------------------------------------  Physical Exam: BP 124/70   Pulse 60   Ht 5\' 4"  (1.626 m)   Wt 151 lb (68.5 kg)   BMI 25.92 kg/m   General:  Well-developed, well-nourished man, seated comfortably in the exam room. He is accompanied by his wife. HEENT: No conjunctival pallor or scleral icterus. Moist mucous membranes.  OP clear. Neck: Supple without lymphadenopathy, thyromegaly, JVD, or HJR. No carotid bruit. Lungs: Normal work of breathing. Clear to auscultation bilaterally without wheezes or crackles. Heart: Regular rate and rhythm without murmurs, rubs, or gallops.  Non-displaced PMI. Abd: Bowel sounds present. Soft, NT/ND without hepatosplenomegaly Ext: Trace left and absent right lower extremity edema. Radial, PT, and DP pulses are 2+ bilaterally. Skin: Warm and dry without rash.  EKG: Normal sinus rhythm without significant abnormalities.  Lab Results  Component Value Date   WBC 7.7 10/03/2016   HGB 10.1 (L) 10/03/2016   HCT 31.7 (L) 10/03/2016   MCV 79.0 10/03/2016   PLT 193.0 10/03/2016    Lab Results  Component Value Date   NA 137 10/03/2016   K 4.9 10/03/2016   CL 101 10/03/2016   CO2 28 10/03/2016   BUN 9 10/03/2016   CREATININE 0.80 10/03/2016   GLUCOSE 136 (H) 10/03/2016   ALT 14 10/03/2016    Lab Results  Component Value Date   CHOL 101 10/03/2016   HDL 31.70 (L) 10/03/2016   LDLCALC 47 10/03/2016   TRIG 112.0 10/03/2016   CHOLHDL 3 10/03/2016    --------------------------------------------------------------------------------------------------  ASSESSMENT AND PLAN: Coronary artery disease without angina Mr. Peplinski  feels well without recurrent chest pain or shortness of breath, though his activity has been limited by back and  leg pain. We have agreed to complete 12 months of dual antiplatelet therapy, which will Jakeob Tullis in mid-October. Thereafter, we will continue with low-dose aspirin alone, given his mild anemia that is currently being worked up by his PCP.  Hypertension Blood pressure is normal today. Mr. Innocent is concerned about potential hyperglycemia from initiation of HCTZ. We have agreed to discontinue HCTZ and continue current doses of carvedilol and losartan. I advised him to monitor his blood pressure and leg swelling at home and contact us if either worsen.  Hyperlipidemia Most recent LDL last week was quite good at 47. We will continue with atorvastatin 20 mg daily.  Snoring Mr. Lanigan has a long history of snoring with questionable apneic episodes. Given his history of hypertension and heart disease, it is reasonable to evaluate for sleep apnea. We will refer him for a sleep study.  Follow-up: Return to clinic in 3 months.  Nelva Bush, MD 10/08/2016 11:59 AM

## 2016-10-08 ENCOUNTER — Encounter: Payer: Self-pay | Admitting: Internal Medicine

## 2016-10-08 ENCOUNTER — Ambulatory Visit (INDEPENDENT_AMBULATORY_CARE_PROVIDER_SITE_OTHER): Payer: Medicare HMO | Admitting: Internal Medicine

## 2016-10-08 VITALS — BP 124/70 | HR 60 | Ht 64.0 in | Wt 151.0 lb

## 2016-10-08 DIAGNOSIS — E785 Hyperlipidemia, unspecified: Secondary | ICD-10-CM

## 2016-10-08 DIAGNOSIS — I10 Essential (primary) hypertension: Secondary | ICD-10-CM | POA: Diagnosis not present

## 2016-10-08 DIAGNOSIS — I2510 Atherosclerotic heart disease of native coronary artery without angina pectoris: Secondary | ICD-10-CM

## 2016-10-08 DIAGNOSIS — R0683 Snoring: Secondary | ICD-10-CM

## 2016-10-08 NOTE — Patient Instructions (Signed)
Medication Instructions:  You can stop taking Brilinta when you run out of your current supply. Continue to take aspirin 81 mg daily.  Stop HCTZ (hydrochlorothiazide). Let us know if you get swelling in your feet or ankles.  Labwork: none  Testing/Procedures: Your physician has recommended that you have a sleep study. This test records several body functions during sleep, including: brain activity, eye movement, oxygen and carbon dioxide blood levels, heart rate and rhythm, breathing rate and rhythm, the flow of air through your mouth and nose, snoring, body muscle movements, and chest and belly movement.    Follow-Up: Your physician recommends that you schedule a follow-up appointment in: 3 months with Dr End.   Any Other Special Instructions Will Be Listed Below (If Applicable).     If you need a refill on your cardiac medications before your next appointment, please call your pharmacy.

## 2016-10-09 ENCOUNTER — Other Ambulatory Visit: Payer: Self-pay | Admitting: Physician Assistant

## 2016-10-09 ENCOUNTER — Telehealth: Payer: Self-pay | Admitting: *Deleted

## 2016-10-09 ENCOUNTER — Other Ambulatory Visit: Payer: Self-pay | Admitting: Family Medicine

## 2016-10-09 DIAGNOSIS — E1165 Type 2 diabetes mellitus with hyperglycemia: Principal | ICD-10-CM

## 2016-10-09 DIAGNOSIS — IMO0001 Reserved for inherently not codable concepts without codable children: Secondary | ICD-10-CM

## 2016-10-09 NOTE — Telephone Encounter (Signed)
Informed patient of upcoming sleep study and patient understanding was verbalized. Patient understands his sleep study is scheduled for Saturday November 10 2016. Patient understands his sleep study will be done at [facility] sleep lab. Patient understands he will receive a sleep packet in a week or so. Patient understands to call if he does not receive the sleep packet in a timely manner. Patient agrees with treatment and thanked me for call.

## 2016-10-09 NOTE — Telephone Encounter (Signed)
Please advise on refill request  Last filled 04/23/16 Qty:180 Rf: 1 Last OV 06/05/16

## 2016-10-09 NOTE — Telephone Encounter (Signed)
-----   Message from Katrine Coho, RN sent at 10/08/2016 12:38 PM EDT ----- Regarding: sleep study Sleep study-Dr End

## 2016-10-10 ENCOUNTER — Other Ambulatory Visit (INDEPENDENT_AMBULATORY_CARE_PROVIDER_SITE_OTHER): Payer: Medicare HMO

## 2016-10-10 DIAGNOSIS — K625 Hemorrhage of anus and rectum: Secondary | ICD-10-CM

## 2016-10-10 LAB — FECAL OCCULT BLOOD, IMMUNOCHEMICAL: FECAL OCCULT BLD: NEGATIVE

## 2016-10-15 ENCOUNTER — Other Ambulatory Visit: Payer: Self-pay | Admitting: Family Medicine

## 2016-10-15 DIAGNOSIS — E785 Hyperlipidemia, unspecified: Secondary | ICD-10-CM

## 2016-10-15 DIAGNOSIS — E119 Type 2 diabetes mellitus without complications: Secondary | ICD-10-CM

## 2016-10-16 DIAGNOSIS — M1712 Unilateral primary osteoarthritis, left knee: Secondary | ICD-10-CM | POA: Diagnosis not present

## 2016-10-23 ENCOUNTER — Telehealth: Payer: Self-pay | Admitting: *Deleted

## 2016-10-23 NOTE — Telephone Encounter (Signed)
-----   Message from Almyra Free sent at 10/23/2016  1:28 PM EDT ----- Regarding: FW: APPROVED? This is approved. Amy ----- Message ----- From: Almyra Free Sent: 10/22/2016   3:26 PM To: Freada Bergeron, CMA, Lillia Pauls Subject: RE: APPROVED?                                  Yes, this is approved. Just got it. ----- Message ----- From: Lillia Pauls Sent: 10/22/2016   1:37 PM To: Amy Memory Dance, CMA Subject: FW: APPROVED?                                  Amy is doing sleep studies for October.    I don't think she has any back that are approved yet  ----- Message ----- From: Freada Bergeron, CMA Sent: 10/22/2016   1:29 PM To: Lillia Pauls Subject: APPROVED?                                      Patient calling to see if his insurance approved his sleep study scheduled for 10/13.

## 2016-10-23 NOTE — Telephone Encounter (Signed)
Patient notified on mobile phone [phone].

## 2016-10-25 ENCOUNTER — Telehealth: Payer: Self-pay | Admitting: Family Medicine

## 2016-10-25 DIAGNOSIS — R69 Illness, unspecified: Secondary | ICD-10-CM | POA: Diagnosis not present

## 2016-10-25 MED ORDER — ONETOUCH LANCETS MISC: 11 refills | Status: AC

## 2016-10-25 MED ORDER — GLUCOSE BLOOD VI STRP: ORAL_STRIP | 12 refills | Status: DC

## 2016-10-25 MED ORDER — BLOOD GLUCOSE MONITOR KIT: PACK | 0 refills | Status: AC

## 2016-10-25 NOTE — Telephone Encounter (Signed)
°  Relation to FB:XUXY  Call back number: Pharmacy: [id number] - [place], [place] [phone] (Phone) [phone] (Fax)     Reason for call:  Patient requesting 2 month supply one touch meter and one touch test strips, please advise

## 2016-10-25 NOTE — Telephone Encounter (Signed)
I faxed glucometer supplies to pharm/thx dmf

## 2016-11-10 ENCOUNTER — Ambulatory Visit (HOSPITAL_BASED_OUTPATIENT_CLINIC_OR_DEPARTMENT_OTHER): Payer: Medicare HMO | Attending: Internal Medicine | Admitting: Cardiology

## 2016-11-10 VITALS — Ht 65.0 in | Wt 149.0 lb

## 2016-11-10 DIAGNOSIS — G4733 Obstructive sleep apnea (adult) (pediatric): Secondary | ICD-10-CM | POA: Diagnosis not present

## 2016-11-10 DIAGNOSIS — R0683 Snoring: Secondary | ICD-10-CM | POA: Insufficient documentation

## 2016-11-10 DIAGNOSIS — I10 Essential (primary) hypertension: Secondary | ICD-10-CM | POA: Insufficient documentation

## 2016-11-13 DIAGNOSIS — M5441 Lumbago with sciatica, right side: Secondary | ICD-10-CM | POA: Diagnosis not present

## 2016-11-13 DIAGNOSIS — M25561 Pain in right knee: Secondary | ICD-10-CM | POA: Diagnosis not present

## 2016-11-13 DIAGNOSIS — M545 Low back pain: Secondary | ICD-10-CM | POA: Diagnosis not present

## 2016-11-13 DIAGNOSIS — M25562 Pain in left knee: Secondary | ICD-10-CM | POA: Diagnosis not present

## 2016-11-15 NOTE — Procedures (Signed)
   Patient Name: Eddie Mcdaniel, Eddie Mcdaniel Date: 11/10/2016 Gender: Male D.O.B: 25-Jun-1940 Age (years): 70 Referring Provider: Nelva Bush MD Height (inches): 65 Interpreting Physician: Fransico Him MD, ABSM Weight (lbs): 149 RPSGT: Haroldine Laws BMI: 25 MRN: [id number] Neck Size: 14.00  CLINICAL INFORMATION Sleep Study Type: NPSG  Indication for sleep study: Hypertension, Snoring  Epworth Sleepiness Score: 0  SLEEP STUDY TECHNIQUE As per the AASM Manual for the Scoring of Sleep and Associated Events v2.3 (April 2016) with a hypopnea requiring 4% desaturations.  The channels recorded and monitored were frontal, central and occipital EEG, electrooculogram (EOG), submentalis EMG (chin), nasal and oral airflow, thoracic and abdominal wall motion, anterior tibialis EMG, snore microphone, electrocardiogram, and pulse oximetry.  MEDICATIONS Medications self-administered by patient taken the night of the study : N/A  SLEEP ARCHITECTURE The study was initiated at 10:26:22 PM and ended at 5:02:20 AM.  Sleep onset time was 7.7 minutes and the sleep efficiency was 56.8%. The total sleep time was 225.0 minutes.  Stage REM latency was 385.5 minutes.  The patient spent 18.22% of the night in stage N1 sleep, 55.78% in stage N2 sleep, 25.11% in stage N3 and 0.89% in REM.  Alpha intrusion was absent.  Supine sleep was 26.20%.  RESPIRATORY PARAMETERS The overall apnea/hypopnea index (AHI) was 13.6 per hour. There were 12 total apneas, including 12 obstructive, 0 central and 0 mixed apneas. There were 39 hypopneas and 4 RERAs.  The AHI during Stage REM sleep was 30.0 per hour.  AHI while supine was 26.5 per hour.  The mean oxygen saturation was 97.30%. The minimum SpO2 during sleep was 72.00%.  soft snoring was noted during this study.  CARDIAC DATA The 2 lead EKG demonstrated sinus rhythm. The mean heart rate was 62.18 beats per minute. Other EKG findings include:  None.  LEG MOVEMENT DATA The total PLMS were 714 with a resulting PLMS index of 190.40. Associated arousal with leg movement index was 20.8 .  IMPRESSIONS - Mild obstructive sleep apnea occurred during this study (AHI = 13.6/h). - No significant central sleep apnea occurred during this study (CAI = 0.0/h). - Moderate oxygen desaturation was noted during this study (Min O2 = 72.00%). - The patient snored with soft snoring volume. - No cardiac abnormalities were noted during this study. - Severe periodic limb movements of sleep occurred during the study. Associated arousals were significant.  DIAGNOSIS - Obstructive Sleep Apnea (327.23 [G47.33 ICD-10])  RECOMMENDATIONS - Therapeutic CPAP titration to determine optimal pressure required to alleviate sleep disordered breathing. - Positional therapy avoiding supine position during sleep. - Avoid alcohol, sedatives and other CNS depressants that may worsen sleep apnea and disrupt normal sleep architecture. - Sleep hygiene should be reviewed to assess factors that may improve sleep quality. - Weight management and regular exercise should be initiated or continued if appropriate.  [place], American Board of Sleep Medicine  ELECTRONICALLY SIGNED ON:  11/15/2016, 1:46 PM [place] PH: [phone]   FX: [phone] [place]

## 2016-11-19 DIAGNOSIS — M48061 Spinal stenosis, lumbar region without neurogenic claudication: Secondary | ICD-10-CM | POA: Diagnosis not present

## 2016-11-23 ENCOUNTER — Telehealth: Payer: Self-pay | Admitting: *Deleted

## 2016-11-23 DIAGNOSIS — R0683 Snoring: Secondary | ICD-10-CM

## 2016-11-23 DIAGNOSIS — I10 Essential (primary) hypertension: Secondary | ICD-10-CM

## 2016-11-23 NOTE — Telephone Encounter (Signed)
-----   Message from Eddie Margarita, MD sent at 11/15/2016  1:50 PM EDT ----- Please let patient know that they have sleep apnea and recommend CPAP titration. Please set up titration in the sleep lab.

## 2016-11-23 NOTE — Telephone Encounter (Signed)
Informed patient of sleep study results and patient understanding was verbalized. Patient understands Dr Radford Pax recommends a CPAP titration but the patient wants to wait and get a copy of his sleep study before scheduling his CPAP TITRATION. Patient will come in on Monday 10 29/18 to get a copy of his study.

## 2016-11-27 NOTE — Telephone Encounter (Signed)
Informed patient of CPAP titration and verbalized understanding was indicated. Patient understands his CPAP titration is scheduled for Tuesday January 08 2017. Patient understands his Titration will be done at [facility] sleep lab. Patient understands he will receive a sleep packet in a week or so. Patient understands to call if he does not receive the sleep packet in a timely manner. Patient agrees with treatment and thanked me for call.

## 2016-11-30 DIAGNOSIS — R69 Illness, unspecified: Secondary | ICD-10-CM | POA: Diagnosis not present

## 2016-12-03 DIAGNOSIS — R69 Illness, unspecified: Secondary | ICD-10-CM | POA: Diagnosis not present

## 2016-12-11 DIAGNOSIS — R972 Elevated prostate specific antigen [PSA]: Secondary | ICD-10-CM | POA: Diagnosis not present

## 2016-12-12 DIAGNOSIS — M48061 Spinal stenosis, lumbar region without neurogenic claudication: Secondary | ICD-10-CM | POA: Diagnosis not present

## 2016-12-14 ENCOUNTER — Encounter: Payer: Self-pay | Admitting: *Deleted

## 2016-12-17 ENCOUNTER — Other Ambulatory Visit: Payer: Self-pay | Admitting: Family Medicine

## 2016-12-17 DIAGNOSIS — I10 Essential (primary) hypertension: Secondary | ICD-10-CM

## 2016-12-17 DIAGNOSIS — E785 Hyperlipidemia, unspecified: Secondary | ICD-10-CM

## 2016-12-24 DIAGNOSIS — N401 Enlarged prostate with lower urinary tract symptoms: Secondary | ICD-10-CM | POA: Diagnosis not present

## 2016-12-24 DIAGNOSIS — R351 Nocturia: Secondary | ICD-10-CM | POA: Diagnosis not present

## 2016-12-24 DIAGNOSIS — R972 Elevated prostate specific antigen [PSA]: Secondary | ICD-10-CM | POA: Diagnosis not present

## 2017-01-08 ENCOUNTER — Encounter (HOSPITAL_BASED_OUTPATIENT_CLINIC_OR_DEPARTMENT_OTHER): Payer: Medicare HMO

## 2017-01-10 ENCOUNTER — Ambulatory Visit: Payer: Medicare HMO | Admitting: Internal Medicine

## 2017-01-11 ENCOUNTER — Ambulatory Visit (HOSPITAL_BASED_OUTPATIENT_CLINIC_OR_DEPARTMENT_OTHER): Payer: Medicare HMO | Attending: Cardiology | Admitting: Cardiology

## 2017-01-11 VITALS — Ht 64.0 in | Wt 154.0 lb

## 2017-01-11 DIAGNOSIS — I10 Essential (primary) hypertension: Secondary | ICD-10-CM

## 2017-01-11 DIAGNOSIS — R0683 Snoring: Secondary | ICD-10-CM | POA: Diagnosis not present

## 2017-01-11 DIAGNOSIS — G4733 Obstructive sleep apnea (adult) (pediatric): Secondary | ICD-10-CM | POA: Diagnosis not present

## 2017-01-14 ENCOUNTER — Other Ambulatory Visit (INDEPENDENT_AMBULATORY_CARE_PROVIDER_SITE_OTHER): Payer: Medicare HMO

## 2017-01-14 DIAGNOSIS — E119 Type 2 diabetes mellitus without complications: Secondary | ICD-10-CM | POA: Diagnosis not present

## 2017-01-14 DIAGNOSIS — E785 Hyperlipidemia, unspecified: Secondary | ICD-10-CM

## 2017-01-14 LAB — COMPREHENSIVE METABOLIC PANEL
ALT: 15 U/L (ref 0–53)
AST: 16 U/L (ref 0–37)
Albumin: 4.2 g/dL (ref 3.5–5.2)
Alkaline Phosphatase: 37 U/L — ABNORMAL LOW (ref 39–117)
BILIRUBIN TOTAL: 0.5 mg/dL (ref 0.2–1.2)
BUN: 9 mg/dL (ref 6–23)
CALCIUM: 9.5 mg/dL (ref 8.4–10.5)
CHLORIDE: 104 meq/L (ref 96–112)
CO2: 30 meq/L (ref 19–32)
Creatinine, Ser: 0.78 mg/dL (ref 0.40–1.50)
GFR: 103.23 mL/min (ref >60.00)
GLUCOSE: 110 mg/dL — AB (ref 70–99)
POTASSIUM: 4.9 meq/L (ref 3.5–5.1)
Sodium: 141 mEq/L (ref 135–145)
Total Protein: 7.1 g/dL (ref 6.0–8.3)

## 2017-01-14 LAB — HEMOGLOBIN A1C: HEMOGLOBIN A1C: 6.7 % — AB (ref 4.6–6.5)

## 2017-01-14 LAB — LIPID PANEL
Cholesterol: 94 mg/dL (ref 0–200)
HDL: 37.7 mg/dL — AB (ref >39.00)
LDL Cholesterol: 43 mg/dL (ref 0–99)
NONHDL: 56.55
TRIGLYCERIDES: 67 mg/dL (ref 0.0–149.0)
Total CHOL/HDL Ratio: 3
VLDL: 13.4 mg/dL (ref 0.0–40.0)

## 2017-01-16 ENCOUNTER — Other Ambulatory Visit: Payer: Self-pay

## 2017-01-16 DIAGNOSIS — E1165 Type 2 diabetes mellitus with hyperglycemia: Principal | ICD-10-CM

## 2017-01-16 DIAGNOSIS — IMO0001 Reserved for inherently not codable concepts without codable children: Secondary | ICD-10-CM

## 2017-01-16 NOTE — Procedures (Signed)
   NAME: Eddie Mcdaniel DATE OF BIRTH:  17-Feb-1940 MEDICAL RECORD NUMBER [id number]  LOCATION: [facility]  PHYSICIAN: Chistine Dematteo  DATE OF STUDY: 01/11/2017  SLEEP STUDY TYPE: Positive Airway Pressure Titration               REFERRING PHYSICIAN: Sueanne Margarita, MD  INDICATION FOR STUDY: OSA   HEIGHT: 5\' 4"  (162.6 cm)  WEIGHT: 154 lb (69.9 kg)    Body mass index is 26.43 kg/m.  NECK SIZE: 14 in.  CLINICAL INFORMATION The patient is referred for a CPAP titration to treat sleep apnea.  SLEEP STUDY TECHNIQUE As per the AASM Manual for the Scoring of Sleep and Associated Events v2.3 (April 2016) with a hypopnea requiring 4% desaturations.  The channels recorded and monitored were frontal, central and occipital EEG, electrooculogram (EOG), submentalis EMG (chin), nasal and oral airflow, thoracic and abdominal wall motion, anterior tibialis EMG, snore microphone, electrocardiogram, and pulse oximetry. Continuous positive airway pressure (CPAP) was initiated at the beginning of the study and titrated to treat sleep-disordered breathing.  MEDICATIONS Medications self-administered by patient taken the night of the study : N/A  TECHNICIAN COMMENTS Comments added by technician: Patient was restless all through the night. Patient had difficulty initiating sleep. Comments added by scorer: N/A  RESPIRATORY PARAMETERS Optimal PAP Pressure (cm): 13  AHI at Optimal Pressure (/hr):1.3 Overall Minimal O2 (%):92.00 Supine % at Optimal Pressure (%): 0 Minimal O2 at Optimal Pressure (%): 92.0   SLEEP ARCHITECTURE The study was initiated at 10:05:28 PM and ended at 4:22:28 AM.  Sleep onset time was 24.3 minutes and the sleep efficiency was 64.0%. The total sleep time was 241.2 minutes.  The patient spent 1.87% of the night in stage N1 sleep, 65.80% in stage N2 sleep, 0.00% in stage N3 and 32.33% in REM.Stage REM latency was 236.0 minutes  Wake after sleep onset was  111.5. Alpha intrusion was absent. Supine sleep was 3.62%.  CARDIAC DATA The 2 lead EKG demonstrated sinus rhythm. The mean heart rate was 63.95 beats per minute. Other EKG findings include: None.  LEG MOVEMENT DATA The total Periodic Limb Movements of Sleep (PLMS) were 631. The PLMS index was 156.95. A PLMS index of <15 is considered normal in adults.  IMPRESSIONS - The optimal PAP pressure was 13 cm of water. - Central sleep apnea was not noted during this titration (CAI = 0.2/h). - Significant oxygen desaturations were not observed during this titration (min O2 = 92.00%). - The patient snored with loud snoring volume during this titration study. - No cardiac abnormalities were observed during this study. - Severe periodic limb movements were observed during this study. Arousals associated with PLMs were rare.  DIAGNOSIS - Obstructive Sleep Apnea (327.23 [G47.33 ICD-10])  RECOMMENDATIONS - Trial of CPAP therapy on 13 cm H2O with a Small size Philips Respironics Full Face Mask Amara View mask and heated humidification. - Avoid alcohol, sedatives and other CNS depressants that may worsen sleep apnea and disrupt normal sleep architecture. - Sleep hygiene should be reviewed to assess factors that may improve sleep quality. - Weight management and regular exercise should be initiated or continued. - Return to Sleep Center for re-evaluation after 10 weeks of therapy  [place], [place] of [facility]  ELECTRONICALLY SIGNED ON:  01/16/2017, 10:00 PM [place] PH: [phone]   FX: [phone] ACCREDITED BY THE AMERICAN ACADEMY OF SLEEP MEDICINE

## 2017-01-18 ENCOUNTER — Encounter: Payer: Self-pay | Admitting: Family Medicine

## 2017-01-18 ENCOUNTER — Ambulatory Visit (INDEPENDENT_AMBULATORY_CARE_PROVIDER_SITE_OTHER): Payer: Medicare HMO | Admitting: Family Medicine

## 2017-01-18 VITALS — BP 130/62 | HR 78 | Temp 97.7°F | Ht 64.0 in | Wt 149.0 lb

## 2017-01-18 DIAGNOSIS — IMO0001 Reserved for inherently not codable concepts without codable children: Secondary | ICD-10-CM

## 2017-01-18 DIAGNOSIS — E785 Hyperlipidemia, unspecified: Secondary | ICD-10-CM

## 2017-01-18 DIAGNOSIS — L299 Pruritus, unspecified: Secondary | ICD-10-CM

## 2017-01-18 DIAGNOSIS — I10 Essential (primary) hypertension: Secondary | ICD-10-CM | POA: Diagnosis not present

## 2017-01-18 DIAGNOSIS — E1165 Type 2 diabetes mellitus with hyperglycemia: Secondary | ICD-10-CM | POA: Diagnosis not present

## 2017-01-18 DIAGNOSIS — Z0000 Encounter for general adult medical examination without abnormal findings: Secondary | ICD-10-CM | POA: Diagnosis not present

## 2017-01-18 DIAGNOSIS — R972 Elevated prostate specific antigen [PSA]: Secondary | ICD-10-CM

## 2017-01-18 NOTE — Assessment & Plan Note (Signed)
Tolerating statin, encouraged heart healthy diet, avoid trans fats, minimize simple carbs and saturated fats. Increase exercise as tolerated 

## 2017-01-18 NOTE — Assessment & Plan Note (Signed)
Labs reviewed a1c 6.7 con't meds Recheck 6 months

## 2017-01-18 NOTE — Progress Notes (Signed)
Subjective:  I acted as a Education administrator for [employer], [place]   Patient ID: Eddie Mcdaniel, male    DOB: [birth year], 76 y.o.   MRN: [id number]  Chief Complaint  Patient presents with  . Annual Exam    HPI  Patient is in today for annual exam   Patient Care Team: End, Harrell Gave, MD as PCP - General (Cardiology) Rana Snare, MD as Consulting Physician (Urology) Gatha Mayer, MD as Consulting Physician (Gastroenterology) [facility], [place]   Past Medical History:  Diagnosis Date  . Contrast media allergy   . Coronary artery disease    a. USA/abnl nuc s/p  LHC 11/18/15 showed diffusely disease LAD s/p DES to LAD, mod-severe ostial diagonal disease with tiny diag jailed by stent - given small size, PCI not performed, mod nonobstructive mRCA disease, EF 55-65%.  Marland Kitchen GERD (gastroesophageal reflux disease)   . Hx of adenomatous polyp of colon 10/06/2015  . Hyperlipidemia   . Hypertension   . Type II diabetes mellitus ([place])     Past Surgical History:  Procedure Laterality Date  . CARDIAC CATHETERIZATION N/A 11/17/2015   Procedure: Left Heart Cath and Coronary Angiography;  Surgeon: Nelva Bush, MD;  Location: [facility];  Service: Cardiovascular;  Laterality: N/A;  . CARDIAC CATHETERIZATION N/A 11/17/2015   Procedure: Coronary Stent Intervention;  Surgeon: Nelva Bush, MD;  Location: [facility];  Service: Cardiovascular;  Laterality: N/A;  . CATARACT EXTRACTION W/ INTRAOCULAR LENS  IMPLANT, BILATERAL Bilateral 2005  . CORONARY ANGIOPLASTY    . SHOULDER ARTHROSCOPY W/ ROTATOR CUFF REPAIR Right 2000s X 1  . SHOULDER OPEN ROTATOR CUFF REPAIR Right 2000s X 1   "after 1st OR didn't work; had to put tissue in this time"    Family History  Problem Relation Age of Onset  . Diabetes Mother   . Hypertension Mother   . Cancer Brother 16       brain, stomach  . Hyperlipidemia Brother   . Heart disease Brother        MI  . Heart  disease Brother        MI  . Diabetes Brother   . Stroke Brother   . Hypertension Brother   . Colon cancer Neg Hx   . Esophageal cancer Neg Hx   . Rectal cancer Neg Hx   . Stomach cancer Neg Hx     Social History   Socioeconomic History  . Marital status: Married    Spouse name: Not on file  . Number of children: Not on file  . Years of education: Not on file  . Highest education level: Not on file  Social Needs  . Financial resource strain: Not on file  . Food insecurity - worry: Not on file  . Food insecurity - inability: Not on file  . Transportation needs - medical: Not on file  . Transportation needs - non-medical: Not on file  Occupational History  . Occupation: retired  Tobacco Use  . Smoking status: Never Smoker  . Smokeless tobacco: Former Systems developer    Types: Chew  Substance and Sexual Activity  . Alcohol use: No  . Drug use: No  . Sexual activity: No    Partners: Female  Other Topics Concern  . Not on file  Social History Narrative   Walking daily   yoga       Outpatient Medications Prior to Visit  Medication Sig Dispense Refill  . aspirin EC 81  MG tablet Take 1 tablet (81 mg total) by mouth daily. 90 tablet 3  . atorvastatin (LIPITOR) 20 MG tablet TAKE 1 TABLET EVERY EVENING 90 tablet 1  . blood glucose meter kit and supplies KIT One Touch Meter and Strips supplies for BID testing Dx: E11.9 1 each 0  . Blood Glucose Monitoring Suppl (TRUE METRIX AIR GLUCOSE METER) W/DEVICE KIT 1 Device by Does not apply route daily. Check blood sugar twice daily. Dx. E11.9 1 kit 0  . carvedilol (COREG) 12.5 MG tablet TAKE 1 TABLET TWICE A DAY  WITH A MEAL 180 tablet 2  . CINNAMON PO Take by mouth See admin instructions. Takes "half a spoonful" once a day.    Marland Kitchen GARLIC PO Take 1 capsule by mouth daily.    Marland Kitchen glucose blood (ONE TOUCH ULTRA TEST) test strip Use as instructed 100 each 12  . losartan (COZAAR) 25 MG tablet Take 1 tablet (25 mg total) by mouth daily. 90 tablet 3  .  metFORMIN (GLUCOPHAGE-XR) 750 MG 24 hr tablet TAKE 1 TABLET TWICE A DAY 180 tablet 1  . nitroGLYCERIN (NITROSTAT) 0.4 MG SL tablet Place 0.4 mg under the tongue every 5 (five) minutes x 3 doses as needed for chest pain. Call 911 at 3rd dose    . ONE TOUCH LANCETS MISC UAD BID for Dx: E11.9 100 each 11  . triamcinolone (KENALOG) 0.025 % ointment Apply 1 application topically 2 (two) times daily. 30 g 0  . Turmeric POWD Take by mouth See admin instructions. Takes "half a spoonful" once a day.     No facility-administered medications prior to visit.     Allergies  Allergen Reactions  . Ace Inhibitors Anaphylaxis    angioedema  . Contrast Media [Iodinated Diagnostic Agents] Itching    Review of Systems  Constitutional: Negative for chills, fever and malaise/fatigue.  HENT: Negative for congestion and hearing loss.   Eyes: Negative for blurred vision and discharge.  Respiratory: Negative for cough, sputum production and shortness of breath.   Cardiovascular: Negative for chest pain, palpitations and leg swelling.  Gastrointestinal: Negative for abdominal pain, blood in stool, constipation, diarrhea, heartburn, nausea and vomiting.  Genitourinary: Negative for dysuria, frequency, hematuria and urgency.  Musculoskeletal: Negative for back pain, falls and myalgias.  Skin: Negative for rash.  Neurological: Negative for dizziness, sensory change, loss of consciousness, weakness and headaches.  Endo/Heme/Allergies: Negative for environmental allergies. Does not bruise/bleed easily.  Psychiatric/Behavioral: Negative for depression and suicidal ideas. The patient is not nervous/anxious and does not have insomnia.        Objective:    Physical Exam  Constitutional: He is oriented to person, place, and time. He appears well-developed and well-nourished. No distress.  HENT:  Head: Normocephalic and atraumatic.  Right Ear: External ear normal.  Left Ear: External ear normal.  Nose: Nose  normal.  Mouth/Throat: Oropharynx is clear and moist. No oropharyngeal exudate.  Eyes: Conjunctivae and EOM are normal. Pupils are equal, round, and reactive to light. Right eye exhibits no discharge. Left eye exhibits no discharge.  Neck: Normal range of motion. Neck supple. No JVD present. No thyromegaly present.  Cardiovascular: Normal rate, regular rhythm and intact distal pulses. Exam reveals no gallop and no friction rub.  No murmur heard. Pulmonary/Chest: Effort normal and breath sounds normal. No respiratory distress. He has no wheezes. He has no rales. He exhibits no tenderness.  Abdominal: Soft. Bowel sounds are normal. He exhibits no distension and no mass. There is  no tenderness. There is no rebound and no guarding.  Genitourinary:  Genitourinary Comments: Per urology  Musculoskeletal: Normal range of motion. He exhibits no edema or tenderness.  Lymphadenopathy:    He has no cervical adenopathy.  Neurological: He is alert and oriented to person, place, and time. He displays normal reflexes. He exhibits normal muscle tone.  Skin: Skin is warm and dry. No rash noted. He is not diaphoretic. No erythema. No pallor.  Psychiatric: He has a normal mood and affect. His behavior is normal. Judgment and thought content normal.  Nursing note and vitals reviewed.   BP 130/62   Pulse 78   Temp 97.7 F (36.5 C) (Oral)   Ht '5\' 4"'$  (1.626 m)   Wt 149 lb (67.6 kg)   SpO2 97%   BMI 25.58 kg/m  Wt Readings from Last 3 Encounters:  01/18/17 149 lb (67.6 kg)  01/11/17 154 lb (69.9 kg)  11/10/16 149 lb (67.6 kg)   BP Readings from Last 3 Encounters:  01/18/17 130/62  10/08/16 124/70  06/05/16 126/60     Immunization History  Administered Date(s) Administered  . Influenza Split 10/30/2010, 12/21/2011  . Influenza Whole 11/18/2009  . Influenza, High Dose Seasonal PF 02/28/2015, 10/25/2015  . Influenza,inj,Quad PF,6+ Mos 03/25/2013, 12/17/2013  . Influenza-Unspecified 11/29/2016  .  Pneumococcal Conjugate-13 09/08/2013  . Pneumococcal Polysaccharide-23 10/24/2006, 10/27/2015    Health Maintenance  Topic Date Due  . Samul Dada  05/14/1959  . FOOT EXAM  09/09/2014  . OPHTHALMOLOGY EXAM  08/06/2015  . INFLUENZA VACCINE  08/29/2016  . HEMOGLOBIN A1C  07/15/2017  . COLONOSCOPY  09/30/2018  . PNA vac Low Risk Adult  Completed    Lab Results  Component Value Date   WBC 7.7 10/03/2016   HGB 10.1 (L) 10/03/2016   HCT 31.7 (L) 10/03/2016   PLT 193.0 10/03/2016   GLUCOSE 110 (H) 01/14/2017   CHOL 94 01/14/2017   TRIG 67.0 01/14/2017   HDL 37.70 (L) 01/14/2017   LDLCALC 43 01/14/2017   ALT 15 01/14/2017   AST 16 01/14/2017   NA 141 01/14/2017   K 4.9 01/14/2017   CL 104 01/14/2017   CREATININE 0.78 01/14/2017   BUN 9 01/14/2017   CO2 30 01/14/2017   PSA 4.81 (H) 10/03/2016   INR 1.1 11/11/2015   HGBA1C 6.7 (H) 01/14/2017   MICROALBUR 1.3 02/28/2015    No results found for: TSH Lab Results  Component Value Date   WBC 7.7 10/03/2016   HGB 10.1 (L) 10/03/2016   HCT 31.7 (L) 10/03/2016   MCV 79.0 10/03/2016   PLT 193.0 10/03/2016   Lab Results  Component Value Date   NA 141 01/14/2017   K 4.9 01/14/2017   CO2 30 01/14/2017   GLUCOSE 110 (H) 01/14/2017   BUN 9 01/14/2017   CREATININE 0.78 01/14/2017   BILITOT 0.5 01/14/2017   ALKPHOS 37 (L) 01/14/2017   AST 16 01/14/2017   ALT 15 01/14/2017   PROT 7.1 01/14/2017   ALBUMIN 4.2 01/14/2017   CALCIUM 9.5 01/14/2017   ANIONGAP 5 11/18/2015   GFR 103.23 01/14/2017   Lab Results  Component Value Date   CHOL 94 01/14/2017   Lab Results  Component Value Date   HDL 37.70 (L) 01/14/2017   Lab Results  Component Value Date   LDLCALC 43 01/14/2017   Lab Results  Component Value Date   TRIG 67.0 01/14/2017   Lab Results  Component Value Date   CHOLHDL 3 01/14/2017  Lab Results  Component Value Date   HGBA1C 6.7 (H) 01/14/2017         Assessment & Plan:   Problem List Items  Addressed This Visit      Unprioritized   ELEVATED PROSTATE SPECIFIC ANTIGEN    Per urology      Essential hypertension    Well controlled, no changes to meds. Encouraged heart healthy diet such as the DASH diet and exercise as tolerated.       Hyperlipidemia LDL goal <70    Tolerating statin, encouraged heart healthy diet, avoid trans fats, minimize simple carbs and saturated fats. Increase exercise as tolerated      Preventative health care - Primary    Labs reviewed See AVS ghm utd       Uncontrolled type 2 diabetes mellitus without complication, without long-term current use of insulin ([place])    Labs reviewed a1c 6.7 con't meds Recheck 6 months       Other Visit Diagnoses    Pruritic dermatitis       Relevant Orders   Ambulatory referral to Dermatology      I am having Carma Lair "Jayant" maintain his CINNAMON PO, TRUE METRIX AIR GLUCOSE METER, Turmeric, aspirin EC, nitroGLYCERIN, triamcinolone, GARLIC PO, metFORMIN, losartan, ONE TOUCH LANCETS, blood glucose meter kit and supplies, glucose blood, atorvastatin, and carvedilol.  No orders of the defined types were placed in this encounter.   CMA served as Education administrator during this visit. History, Physical and Plan performed by medical provider. Documentation and orders reviewed and attested to.  Ann Held, DO

## 2017-01-18 NOTE — Assessment & Plan Note (Signed)
Labs reviewed See AVS ghm utd

## 2017-01-18 NOTE — Patient Instructions (Signed)
Preventive Care 74 Years and Older, Male Preventive care refers to lifestyle choices and visits with your health care provider that can promote health and wellness. What does preventive care include?  A yearly physical exam. This is also called an annual well check.  Dental exams once or twice a year.  Routine eye exams. Ask your health care provider how often you should have your eyes checked.  Personal lifestyle choices, including: ? Daily care of your teeth and gums. ? Regular physical activity. ? Eating a healthy diet. ? Avoiding tobacco and drug use. ? Limiting alcohol use. ? Practicing safe sex. ? Taking low doses of aspirin every day. ? Taking vitamin and mineral supplements as recommended by your health care provider. What happens during an annual well check? The services and screenings done by your health care provider during your annual well check will depend on your age, overall health, lifestyle risk factors, and family history of disease. Counseling Your health care provider may ask you questions about your:  Alcohol use.  Tobacco use.  Drug use.  Emotional well-being.  Home and relationship well-being.  Sexual activity.  Eating habits.  History of falls.  Memory and ability to understand (cognition).  Work and work environment.  Screening You may have the following tests or measurements:  Height, weight, and BMI.  Blood pressure.  Lipid and cholesterol levels. These may be checked every 5 years, or more frequently if you are over 50 years old.  Skin check.  Lung cancer screening. You may have this screening every year starting at age 55 if you have a 30-pack-year history of smoking and currently smoke or have quit within the past 15 years.  Fecal occult blood test (FOBT) of the stool. You may have this test every year starting at age 50.  Flexible sigmoidoscopy or colonoscopy. You may have a sigmoidoscopy every 5 years or a colonoscopy every 10  years starting at age 50.  Prostate cancer screening. Recommendations will vary depending on your family history and other risks.  Hepatitis C blood test.  Hepatitis B blood test.  Sexually transmitted disease (STD) testing.  Diabetes screening. This is done by checking your blood sugar (glucose) after you have not eaten for a while (fasting). You may have this done every 1-3 years.  Abdominal aortic aneurysm (AAA) screening. You may need this if you are a current or former smoker.  Osteoporosis. You may be screened starting at age 70 if you are at high risk.  Talk with your health care provider about your test results, treatment options, and if necessary, the need for more tests. Vaccines Your health care provider may recommend certain vaccines, such as:  Influenza vaccine. This is recommended every year.  Tetanus, diphtheria, and acellular pertussis (Tdap, Td) vaccine. You may need a Td booster every 10 years.  Varicella vaccine. You may need this if you have not been vaccinated.  Zoster vaccine. You may need this after age 60.  Measles, mumps, and rubella (MMR) vaccine. You may need at least one dose of MMR if you were born in 1957 or later. You may also need a second dose.  Pneumococcal 13-valent conjugate (PCV13) vaccine. One dose is recommended after age 65.  Pneumococcal polysaccharide (PPSV23) vaccine. One dose is recommended after age 65.  Meningococcal vaccine. You may need this if you have certain conditions.  Hepatitis A vaccine. You may need this if you have certain conditions or if you travel or work in places where you   may be exposed to hepatitis A.  Hepatitis B vaccine. You may need this if you have certain conditions or if you travel or work in places where you may be exposed to hepatitis B.  Haemophilus influenzae type b (Hib) vaccine. You may need this if you have certain risk factors.  Talk to your health care provider about which screenings and vaccines  you need and how often you need them. This information is not intended to replace advice given to you by your health care provider. Make sure you discuss any questions you have with your health care provider. Document Released: 02/11/2015 Document Revised: 10/05/2015 Document Reviewed: 11/16/2014 Elsevier Interactive Patient Education  2018 Elsevier Inc.  

## 2017-01-18 NOTE — Assessment & Plan Note (Signed)
Well controlled, no changes to meds. Encouraged heart healthy diet such as the DASH diet and exercise as tolerated.  °

## 2017-01-18 NOTE — Assessment & Plan Note (Signed)
Per u rology 

## 2017-01-23 ENCOUNTER — Telehealth: Payer: Self-pay | Admitting: *Deleted

## 2017-01-23 NOTE — Telephone Encounter (Signed)
Informed patient of CPAP titration results and verbalized understanding was indicated. Patient understands he had a successful CPAP titration and Dr Radford Pax has ordered him a CPAP. Patient understands he will be contacted by [facility] to set up his cpap. He understands to call if [facility] does not contact him with new setup in a timely manner. He understands he will be called once confirmation has been received from [facility] that he has received his new machine to schedule 10 week follow up appointment.  Patient has declined to receive the CPAP machine.  Order not sent to [facility]  Dr End notified.  Patient would like to have a copy of his diagnosis.   He was grateful for the call and thanked me.

## 2017-01-23 NOTE — Telephone Encounter (Signed)
Thank you for the update.  Shann Lewellyn, MD [facility] Pager: [phone]  

## 2017-01-23 NOTE — Telephone Encounter (Signed)
-----   Message from Sueanne Margarita, MD sent at 01/16/2017 10:06 PM EST ----- Please let patient know that they had a successful PAP titration and let DME know that orders are in EPIC.  Please set up 10 week OV with me.

## 2017-02-04 DIAGNOSIS — M48061 Spinal stenosis, lumbar region without neurogenic claudication: Secondary | ICD-10-CM | POA: Diagnosis not present

## 2017-02-07 ENCOUNTER — Telehealth (HOSPITAL_COMMUNITY): Payer: Self-pay | Admitting: *Deleted

## 2017-02-07 NOTE — Telephone Encounter (Signed)
Error

## 2017-02-14 DIAGNOSIS — M48061 Spinal stenosis, lumbar region without neurogenic claudication: Secondary | ICD-10-CM | POA: Diagnosis not present

## 2017-02-25 DIAGNOSIS — D1801 Hemangioma of skin and subcutaneous tissue: Secondary | ICD-10-CM | POA: Diagnosis not present

## 2017-02-25 DIAGNOSIS — L308 Other specified dermatitis: Secondary | ICD-10-CM | POA: Diagnosis not present

## 2017-03-01 ENCOUNTER — Ambulatory Visit: Payer: Medicare HMO | Admitting: Internal Medicine

## 2017-03-01 ENCOUNTER — Encounter: Payer: Self-pay | Admitting: Internal Medicine

## 2017-03-01 VITALS — BP 126/78 | HR 61 | Ht 64.0 in | Wt 148.6 lb

## 2017-03-01 DIAGNOSIS — G4733 Obstructive sleep apnea (adult) (pediatric): Secondary | ICD-10-CM | POA: Insufficient documentation

## 2017-03-01 DIAGNOSIS — E785 Hyperlipidemia, unspecified: Secondary | ICD-10-CM | POA: Diagnosis not present

## 2017-03-01 DIAGNOSIS — I10 Essential (primary) hypertension: Secondary | ICD-10-CM | POA: Diagnosis not present

## 2017-03-01 DIAGNOSIS — I2510 Atherosclerotic heart disease of native coronary artery without angina pectoris: Secondary | ICD-10-CM

## 2017-03-01 NOTE — Progress Notes (Signed)
Follow-up Outpatient Visit Date: 03/01/2017  Primary Care Provider: Nelva Bush, MD [address] [place] [address]  Chief Complaint: Follow-up coronary artery disease  HPI:  Eddie Mcdaniel is a 77 y.o. year-old male with history of coronary artery disease status post PCI to mid LAD in 10/2015, type 2 diabetes mellitus, hypertension, hyperlipidemia, and GERD, who presents for follow-up of coronary artery disease and sleep apnea. I last saw Eddie Mcdaniel in 09/2016, at which time his main complaint was right hip and left knee pain. He also noted intermittent edema after traveling to [place], though this had improved at our visit. He was also noted to be mildly anemic with stool sample positive for occult blood. Due to snoring and apneic episodes noted by his wife, Eddie Mcdaniel underwent sleep study that confirmed mild OSA.  Today, Eddie Mcdaniel is doing well.  Other than some mild right hip and low back pain, he is without complaints.  He is walking 2 miles a day without any difficulties.  He denies chest pain, shortness of breath, palpitations, lightheadedness, and edema.  He is sleeping well at night and is not interested in wearing CPAP.  He has not had any further rectal bleeding.  --------------------------------------------------------------------------------------------------  Past Medical History:  Diagnosis Date  . Contrast media allergy   . Coronary artery disease    a. USA/abnl nuc s/p  LHC 11/18/15 showed diffusely disease LAD s/p DES to LAD, mod-severe ostial diagonal disease with tiny diag jailed by stent - given small size, PCI not performed, mod nonobstructive mRCA disease, EF 55-65%.  Marland Kitchen GERD (gastroesophageal reflux disease)   . Hx of adenomatous polyp of colon 10/06/2015  . Hyperlipidemia   . Hypertension   . Type II diabetes mellitus ([place])    Past Surgical History:  Procedure Laterality Date  . CARDIAC CATHETERIZATION N/A 11/17/2015   Procedure: Left Heart Cath and  Coronary Angiography;  Surgeon: Nelva Bush, MD;  Location: [facility];  Service: Cardiovascular;  Laterality: N/A;  . CARDIAC CATHETERIZATION N/A 11/17/2015   Procedure: Coronary Stent Intervention;  Surgeon: Nelva Bush, MD;  Location: [facility];  Service: Cardiovascular;  Laterality: N/A;  . CATARACT EXTRACTION W/ INTRAOCULAR LENS  IMPLANT, BILATERAL Bilateral 2005  . CORONARY ANGIOPLASTY    . SHOULDER ARTHROSCOPY W/ ROTATOR CUFF REPAIR Right 2000s X 1  . SHOULDER OPEN ROTATOR CUFF REPAIR Right 2000s X 1   "after 1st OR didn't work; had to put tissue in this time"    Current Meds  Medication Sig  . aspirin EC 81 MG tablet Take 1 tablet (81 mg total) by mouth daily.  Marland Kitchen atorvastatin (LIPITOR) 20 MG tablet TAKE 1 TABLET EVERY EVENING  . blood glucose meter kit and supplies KIT One Touch Meter and Strips supplies for BID testing Dx: E11.9  . Blood Glucose Monitoring Suppl (TRUE METRIX AIR GLUCOSE METER) W/DEVICE KIT 1 Device by Does not apply route daily. Check blood sugar twice daily. Dx. E11.9  . carvedilol (COREG) 12.5 MG tablet TAKE 1 TABLET TWICE A DAY  WITH A MEAL  . CINNAMON PO Take by mouth See admin instructions. Takes "half a spoonful" once a day.  Marland Kitchen GARLIC PO Take 1 capsule by mouth daily.  Marland Kitchen glucose blood (ONE TOUCH ULTRA TEST) test strip Use as instructed  . losartan (COZAAR) 25 MG tablet Take 1 tablet (25 mg total) by mouth daily.  . metFORMIN (GLUCOPHAGE-XR) 750 MG 24 hr tablet TAKE 1 TABLET TWICE  A DAY  . nitroGLYCERIN (NITROSTAT) 0.4 MG SL tablet Place 0.4 mg under the tongue every 5 (five) minutes x 3 doses as needed for chest pain. Call 911 at 3rd dose  . ONE TOUCH LANCETS MISC UAD BID for Dx: E11.9  . Turmeric POWD Take by mouth See admin instructions. Takes "half a spoonful" once a day.  . [DISCONTINUED] triamcinolone cream (KENALOG) 0.1 % Apply 1 application topically daily as needed.    Allergies: Ace inhibitors and Contrast media [iodinated  diagnostic agents]  Social History   Socioeconomic History  . Marital status: Married    Spouse name: Not on file  . Number of children: Not on file  . Years of education: Not on file  . Highest education level: Not on file  Social Needs  . Financial resource strain: Not on file  . Food insecurity - worry: Not on file  . Food insecurity - inability: Not on file  . Transportation needs - medical: Not on file  . Transportation needs - non-medical: Not on file  Occupational History  . Occupation: retired  Tobacco Use  . Smoking status: Never Smoker  . Smokeless tobacco: Former Systems developer    Types: Chew  Substance and Sexual Activity  . Alcohol use: No  . Drug use: No  . Sexual activity: No    Partners: Female  Other Topics Concern  . Not on file  Social History Narrative   Walking daily   yoga       Family History  Problem Relation Age of Onset  . Diabetes Mother   . Hypertension Mother   . Cancer Brother 20       brain, stomach  . Hyperlipidemia Brother   . Heart disease Brother        MI  . Heart disease Brother        MI  . Diabetes Brother   . Stroke Brother   . Hypertension Brother   . Colon cancer Neg Hx   . Esophageal cancer Neg Hx   . Rectal cancer Neg Hx   . Stomach cancer Neg Hx     Review of Systems: A 12-system review of systems was performed and was negative except as noted in the HPI.  --------------------------------------------------------------------------------------------------  Physical Exam: BP 126/78   Pulse 61   Ht '5\' 4"'$  (1.626 m)   Wt 148 lb 9.6 oz (67.4 kg)   SpO2 98%   BMI 25.51 kg/m   General: Well-developed, well-nourished man, seated comfortably in the exam room.  He is accompanied by his wife. HEENT: No conjunctival pallor or scleral icterus. Moist mucous membranes.  OP clear. Neck: Supple without lymphadenopathy, thyromegaly, JVD, or HJR. Lungs: Normal work of breathing. Clear to auscultation bilaterally without wheezes or  crackles. Heart: Regular rate and rhythm without murmurs, rubs, or gallops. Non-displaced PMI. Abd: Bowel sounds present. Soft, NT/ND without hepatosplenomegaly Ext: No lower extremity edema. Radial, PT, and DP pulses are 2+ bilaterally. Skin: Warm and dry without rash.  Lab Results  Component Value Date   WBC 7.7 10/03/2016   HGB 10.1 (L) 10/03/2016   HCT 31.7 (L) 10/03/2016   MCV 79.0 10/03/2016   PLT 193.0 10/03/2016    Lab Results  Component Value Date   NA 141 01/14/2017   K 4.9 01/14/2017   CL 104 01/14/2017   CO2 30 01/14/2017   BUN 9 01/14/2017   CREATININE 0.78 01/14/2017   GLUCOSE 110 (H) 01/14/2017   ALT 15 01/14/2017  Lab Results  Component Value Date   CHOL 94 01/14/2017   HDL 37.70 (L) 01/14/2017   LDLCALC 43 01/14/2017   TRIG 67.0 01/14/2017   CHOLHDL 3 01/14/2017    --------------------------------------------------------------------------------------------------  ASSESSMENT AND PLAN: Coronary artery disease without angina Eddie Mcdaniel continues to do well following PCI last year to the LAD.  He has not had recurrence of chest pain or other symptoms to suggest worsening coronary insufficiency.  We will continue his current regimen for secondary prevention, including aspirin, atorvastatin, and carvedilol.  Hyperlipidemia Cholesterol is well controlled with LDL of 43 in 12/2016.  Continue atorvastatin 20 mg daily.  Hypertension Blood pressure is well controlled today.  Continue carvedilol and losartan.  Given her recent recalls on multiple ARB's, I have asked Eddie Mcdaniel to touch base with his pharmacy to ensure that his losartan is not under recall.  Obstructive sleep apnea Mild OSA diagnosed by sleep study.  Eddie Mcdaniel  underwent CPAP titration but is not interested in obtaining a machine.  We discussed the potential long-term consequences of untreated sleep apnea but he still asked to defer treatment.  Follow-up: Return to clinic in 6  months.  Nelva Bush, MD 03/01/2017 7:43 PM

## 2017-03-01 NOTE — Patient Instructions (Addendum)
Medication Instructions:  Your physician recommends that you continue on your current medications as directed. Please refer to the Current Medication list given to you today.  -- If you need a refill on your cardiac medications before your next appointment, please call your pharmacy. --  Labwork: None ordered  Testing/Procedures: None ordered  Follow-Up: Your physician wants you to follow-up in: 6 MONTHS with Dr. Saunders Revel.    You will receive a reminder letter in the mail two months in advance. If you don't receive a letter, please call our office to schedule the follow-up appointment.  Thank you for choosing [facility]!!    Any Other Special Instructions Will Be Listed Below (If Applicable).

## 2017-03-21 ENCOUNTER — Other Ambulatory Visit: Payer: Self-pay | Admitting: Family Medicine

## 2017-03-21 DIAGNOSIS — E1165 Type 2 diabetes mellitus with hyperglycemia: Principal | ICD-10-CM

## 2017-03-21 DIAGNOSIS — IMO0001 Reserved for inherently not codable concepts without codable children: Secondary | ICD-10-CM

## 2017-05-07 ENCOUNTER — Encounter: Payer: Self-pay | Admitting: Family Medicine

## 2017-05-07 ENCOUNTER — Ambulatory Visit (INDEPENDENT_AMBULATORY_CARE_PROVIDER_SITE_OTHER): Payer: Medicare HMO | Admitting: Family Medicine

## 2017-05-07 VITALS — BP 126/70 | HR 61 | Temp 97.9°F | Resp 16 | Ht 64.0 in | Wt 148.2 lb

## 2017-05-07 DIAGNOSIS — E785 Hyperlipidemia, unspecified: Secondary | ICD-10-CM

## 2017-05-07 DIAGNOSIS — I10 Essential (primary) hypertension: Secondary | ICD-10-CM

## 2017-05-07 DIAGNOSIS — E1165 Type 2 diabetes mellitus with hyperglycemia: Secondary | ICD-10-CM

## 2017-05-07 DIAGNOSIS — IMO0001 Reserved for inherently not codable concepts without codable children: Secondary | ICD-10-CM

## 2017-05-07 DIAGNOSIS — E1169 Type 2 diabetes mellitus with other specified complication: Secondary | ICD-10-CM

## 2017-05-07 LAB — LIPID PANEL
CHOL/HDL RATIO: 3
Cholesterol: 91 mg/dL (ref 0–200)
HDL: 33.9 mg/dL — AB (ref >39.00)
LDL Cholesterol: 42 mg/dL (ref 0–99)
NONHDL: 57.22
Triglycerides: 77 mg/dL (ref 0.0–149.0)
VLDL: 15.4 mg/dL (ref 0.0–40.0)

## 2017-05-07 LAB — COMPREHENSIVE METABOLIC PANEL
ALK PHOS: 35 U/L — AB (ref 39–117)
ALT: 16 U/L (ref 0–53)
AST: 18 U/L (ref 0–37)
Albumin: 4.1 g/dL (ref 3.5–5.2)
BILIRUBIN TOTAL: 0.6 mg/dL (ref 0.2–1.2)
BUN: 8 mg/dL (ref 6–23)
CO2: 29 meq/L (ref 19–32)
CREATININE: 0.73 mg/dL (ref 0.40–1.50)
Calcium: 9.4 mg/dL (ref 8.4–10.5)
Chloride: 102 mEq/L (ref 96–112)
GFR: 111.33 mL/min (ref >60.00)
GLUCOSE: 106 mg/dL — AB (ref 70–99)
Potassium: 5.1 mEq/L (ref 3.5–5.1)
Sodium: 135 mEq/L (ref 135–145)
TOTAL PROTEIN: 6.8 g/dL (ref 6.0–8.3)

## 2017-05-07 LAB — MICROALBUMIN / CREATININE URINE RATIO
CREATININE, U: 60.4 mg/dL
MICROALB/CREAT RATIO: 1.2 mg/g (ref 0.0–30.0)

## 2017-05-07 LAB — HEMOGLOBIN A1C: Hgb A1c MFr Bld: 6.5 % (ref 4.6–6.5)

## 2017-05-07 NOTE — Assessment & Plan Note (Signed)
Tolerating statin, encouraged heart healthy diet, avoid trans fats, minimize simple carbs and saturated fats. Increase exercise as tolerated 

## 2017-05-07 NOTE — Progress Notes (Signed)
Patient ID: Eddie Mcdaniel, male   DOB: December 04, 1940, 77 y.o.   MRN: [id number]    Subjective:  I acted as a Education administrator for Dr. Carollee Herter.  Eddie Mcdaniel, [place]   Patient ID: Eddie Mcdaniel, male    DOB: 1940-02-11, 77 y.o.   MRN: [id number]  Chief Complaint  Patient presents with  . Hypertension  . Hyperlipidemia  . Diabetes    HPI  Patient is in today for follow up blood pressure, cholesterol, and diabetes.  HPI HYPERTENSION   Blood pressure range-not checking   Chest pain- no      Dyspnea- no Lightheadedness- no   Edema- no  Other side effects - no   Medication compliance: good Low salt diet- yes    DIABETES    Blood Sugar ranges-120-124  Polyuria- no New Visual problems- no  Hypoglycemic symptoms- no  Other side effects-no Medication compliance - good Last eye exam- due Foot exam- today   HYPERLIPIDEMIA  Medication compliance- good RUQ pain- no  Muscle aches- no Other side effects-no    Patient Care Team: Ann Held, DO as PCP - General (Family Medicine) Rana Snare, MD as Consulting Physician (Urology) Gatha Mayer, MD as Consulting Physician (Gastroenterology) [facility]   Past Medical History:  Diagnosis Date  . Contrast media allergy   . Coronary artery disease    a. USA/abnl nuc s/p  LHC 11/18/15 showed diffusely disease LAD s/p DES to LAD, mod-severe ostial diagonal disease with tiny diag jailed by stent - given small size, PCI not performed, mod nonobstructive mRCA disease, EF 55-65%.  Marland Kitchen GERD (gastroesophageal reflux disease)   . Hx of adenomatous polyp of colon 10/06/2015  . Hyperlipidemia   . Hypertension   . Type II diabetes mellitus ([place])     Past Surgical History:  Procedure Laterality Date  . CARDIAC CATHETERIZATION N/A 11/17/2015   Procedure: Left Heart Cath and Coronary Angiography;  Surgeon: Nelva Bush, MD;  Location: [facility];  Service: Cardiovascular;  Laterality: N/A;  .  CARDIAC CATHETERIZATION N/A 11/17/2015   Procedure: Coronary Stent Intervention;  Surgeon: Nelva Bush, MD;  Location: [facility];  Service: Cardiovascular;  Laterality: N/A;  . CATARACT EXTRACTION W/ INTRAOCULAR LENS  IMPLANT, BILATERAL Bilateral 2005  . CORONARY ANGIOPLASTY    . SHOULDER ARTHROSCOPY W/ ROTATOR CUFF REPAIR Right 2000s X 1  . SHOULDER OPEN ROTATOR CUFF REPAIR Right 2000s X 1   "after 1st OR didn't work; had to put tissue in this time"    Family History  Problem Relation Age of Onset  . Diabetes Mother   . Hypertension Mother   . Cancer Brother 22       brain, stomach  . Hyperlipidemia Brother   . Heart disease Brother        MI  . Heart disease Brother        MI  . Diabetes Brother   . Stroke Brother   . Hypertension Brother   . Colon cancer Neg Hx   . Esophageal cancer Neg Hx   . Rectal cancer Neg Hx   . Stomach cancer Neg Hx     Social History   Socioeconomic History  . Marital status: Married    Spouse name: Not on file  . Number of children: Not on file  . Years of education: Not on file  . Highest education level: Not on file  Occupational History  . Occupation: retired  Scientific laboratory technician  .  Financial resource strain: Not on file  . Food insecurity:    Worry: Not on file    Inability: Not on file  . Transportation needs:    Medical: Not on file    Non-medical: Not on file  Tobacco Use  . Smoking status: Never Smoker  . Smokeless tobacco: Former Systems developer    Types: Chew  Substance and Sexual Activity  . Alcohol use: No  . Drug use: No  . Sexual activity: Never    Partners: Female  Lifestyle  . Physical activity:    Days per week: Not on file    Minutes per session: Not on file  . Stress: Not on file  Relationships  . Social connections:    Talks on phone: Not on file    Gets together: Not on file    Attends religious service: Not on file    Active member of club or organization: Not on file    Attends meetings of clubs or  organizations: Not on file    Relationship status: Not on file  . Intimate partner violence:    Fear of current or ex partner: Not on file    Emotionally abused: Not on file    Physically abused: Not on file    Forced sexual activity: Not on file  Other Topics Concern  . Not on file  Social History Narrative   Walking daily   yoga       Outpatient Medications Prior to Visit  Medication Sig Dispense Refill  . aspirin EC 81 MG tablet Take 1 tablet (81 mg total) by mouth daily. 90 tablet 3  . atorvastatin (LIPITOR) 20 MG tablet TAKE 1 TABLET EVERY EVENING 90 tablet 1  . blood glucose meter kit and supplies KIT One Touch Meter and Strips supplies for BID testing Dx: E11.9 1 each 0  . carvedilol (COREG) 12.5 MG tablet TAKE 1 TABLET TWICE A DAY  WITH A MEAL 180 tablet 2  . CINNAMON PO Take by mouth See admin instructions. Takes "half a spoonful" once a day.    Marland Kitchen GARLIC PO Take 1 capsule by mouth daily.    Marland Kitchen glucose blood (ONE TOUCH ULTRA TEST) test strip Use as instructed 100 each 12  . losartan (COZAAR) 25 MG tablet Take 1 tablet (25 mg total) by mouth daily. 90 tablet 3  . metFORMIN (GLUCOPHAGE-XR) 750 MG 24 hr tablet TAKE 1 TABLET TWICE A DAY 180 tablet 1  . ONE TOUCH LANCETS MISC UAD BID for Dx: E11.9 100 each 11  . Turmeric POWD Take by mouth See admin instructions. Takes "half a spoonful" once a day.    . Blood Glucose Monitoring Suppl (TRUE METRIX AIR GLUCOSE METER) W/DEVICE KIT 1 Device by Does not apply route daily. Check blood sugar twice daily. Dx. E11.9 1 kit 0  . nitroGLYCERIN (NITROSTAT) 0.4 MG SL tablet Place 0.4 mg under the tongue every 5 (five) minutes x 3 doses as needed for chest pain. Call 911 at 3rd dose     No facility-administered medications prior to visit.     Allergies  Allergen Reactions  . Ace Inhibitors Anaphylaxis    angioedema  . Contrast Media [Iodinated Diagnostic Agents] Itching    Review of Systems  Constitutional: Negative for fever and  malaise/fatigue.  HENT: Negative for congestion.   Eyes: Negative for blurred vision.  Respiratory: Negative for cough and shortness of breath.   Cardiovascular: Negative for chest pain, palpitations and leg swelling.  Gastrointestinal: Negative  for vomiting.  Musculoskeletal: Negative for back pain.  Skin: Negative for rash.  Neurological: Negative for loss of consciousness and headaches.       Objective:    Physical Exam  Constitutional: He is oriented to person, place, and time. Vital signs are normal. He appears well-developed and well-nourished. He is sleeping.  HENT:  Head: Normocephalic and atraumatic.  Mouth/Throat: Oropharynx is clear and moist.  Eyes: Pupils are equal, round, and reactive to light. EOM are normal.  Neck: Normal range of motion. Neck supple. No thyromegaly present.  Cardiovascular: Normal rate and regular rhythm.  No murmur heard. Pulmonary/Chest: Effort normal and breath sounds normal. No respiratory distress. He has no wheezes. He has no rales. He exhibits no tenderness.  Musculoskeletal: He exhibits no edema or tenderness.  Neurological: He is alert and oriented to person, place, and time.  Skin: Skin is warm and dry.  Psychiatric: He has a normal mood and affect. His behavior is normal. Judgment and thought content normal.   Diabetic Foot Exam - Simple   Simple Foot Form Diabetic Foot exam was performed with the following findings:  Yes 05/07/2017 10:00 AM  Visual Inspection No deformities, no ulcerations, no other skin breakdown bilaterally:  Yes Sensation Testing Intact to touch and monofilament testing bilaterally:  Yes Pulse Check Posterior Tibialis and Dorsalis pulse intact bilaterally:  Yes Comments     BP 126/70 (BP Location: Left Arm, Cuff Size: Normal)   Pulse 61   Temp 97.9 F (36.6 C) (Oral)   Resp 16   Ht _0  (1.626 m)   Wt 148 lb 3.2 oz (67.2 kg)   SpO2 98%   BMI 25.44 kg/m  Wt Readings from Last 3 Encounters:  05/07/17  148 lb 3.2 oz (67.2 kg)  03/01/17 148 lb 9.6 oz (67.4 kg)  01/18/17 149 lb (67.6 kg)   BP Readings from Last 3 Encounters:  05/07/17 126/70  03/01/17 126/78  01/18/17 130/62     Immunization History  Administered Date(s) Administered  . Influenza Split 10/30/2010, 12/21/2011  . Influenza Whole 11/18/2009  . Influenza, High Dose Seasonal PF 02/28/2015, 10/25/2015  . Influenza,inj,Quad PF,6+ Mos 03/25/2013, 12/17/2013  . Influenza-Unspecified 11/29/2016  . Pneumococcal Conjugate-13 09/08/2013  . Pneumococcal Polysaccharide-23 10/24/2006, 10/27/2015    Health Maintenance  Topic Date Due  . Samul Dada  [birth year]/1961  . OPHTHALMOLOGY EXAM  08/06/2015  . HEMOGLOBIN A1C  07/15/2017  . INFLUENZA VACCINE  08/29/2017  . FOOT EXAM  05/08/2018  . COLONOSCOPY  09/30/2018  . PNA vac Low Risk Adult  Completed    Lab Results  Component Value Date   WBC 7.7 10/03/2016   HGB 10.1 (L) 10/03/2016   HCT 31.7 (L) 10/03/2016   PLT 193.0 10/03/2016   GLUCOSE 106 (H) 05/07/2017   CHOL 91 05/07/2017   TRIG 77.0 05/07/2017   HDL 33.90 (L) 05/07/2017   LDLCALC 42 05/07/2017   ALT 16 05/07/2017   AST 18 05/07/2017   NA 135 05/07/2017   K 5.1 05/07/2017   CL 102 05/07/2017   CREATININE 0.73 05/07/2017   BUN 8 05/07/2017   CO2 29 05/07/2017   PSA 4.81 (H) 10/03/2016   INR 1.1 11/11/2015   HGBA1C 6.5 05/07/2017   MICROALBUR <0.7 05/07/2017    No results found for: TSH Lab Results  Component Value Date   WBC 7.7 10/03/2016   HGB 10.1 (L) 10/03/2016   HCT 31.7 (L) 10/03/2016   MCV 79.0 10/03/2016   PLT 193.0 10/03/2016  Lab Results  Component Value Date   NA 135 05/07/2017   K 5.1 05/07/2017   CO2 29 05/07/2017   GLUCOSE 106 (H) 05/07/2017   BUN 8 05/07/2017   CREATININE 0.73 05/07/2017   BILITOT 0.6 05/07/2017   ALKPHOS 35 (L) 05/07/2017   AST 18 05/07/2017   ALT 16 05/07/2017   PROT 6.8 05/07/2017   ALBUMIN 4.1 05/07/2017   CALCIUM 9.4 05/07/2017   ANIONGAP 5  11/18/2015   GFR 111.33 05/07/2017   Lab Results  Component Value Date   CHOL 91 05/07/2017   Lab Results  Component Value Date   HDL 33.90 (L) 05/07/2017   Lab Results  Component Value Date   LDLCALC 42 05/07/2017   Lab Results  Component Value Date   TRIG 77.0 05/07/2017   Lab Results  Component Value Date   CHOLHDL 3 05/07/2017   Lab Results  Component Value Date   HGBA1C 6.5 05/07/2017         Assessment & Plan:   Problem List Items Addressed This Visit      Unprioritized   Essential hypertension    Well controlled, no changes to meds. Encouraged heart healthy diet such as the DASH diet and exercise as tolerated.       Relevant Orders   Hemoglobin A1c (Completed)   Comprehensive metabolic panel (Completed)   Lipid panel (Completed)   Microalbumin / creatinine urine ratio (Completed)   Hyperlipidemia LDL goal <70    Tolerating statin, encouraged heart healthy diet, avoid trans fats, minimize simple carbs and saturated fats. Increase exercise as tolerated      Uncontrolled type 2 diabetes mellitus without complication, without long-term current use of insulin (H[facility])    hgba1c to be checked, minimize simple carbs. Increase exercise as tolerated. Continue current meds        Other Visit Diagnoses    Uncontrolled type 2 diabetes mellitus with hyperglycemia ([place])    -  Primary   Relevant Orders   Hemoglobin A1c (Completed)   Comprehensive metabolic panel (Completed)   Lipid panel (Completed)   Microalbumin / creatinine urine ratio (Completed)   Hyperlipidemia associated with type 2 diabetes mellitus ([place])       Relevant Orders   Hemoglobin A1c (Completed)   Comprehensive metabolic panel (Completed)   Lipid panel (Completed)      I have discontinued Carma Lair "Jayant"'s TRUE METRIX AIR GLUCOSE METER and nitroGLYCERIN. I am also having him maintain his CINNAMON PO, Turmeric, aspirin EC, GARLIC PO, losartan, ONE TOUCH LANCETS, blood glucose  meter kit and supplies, glucose blood, atorvastatin, carvedilol, and metFORMIN.  No orders of the defined types were placed in this encounter.   CMA served as Education administrator during this visit. History, Physical and Plan performed by medical provider. Documentation and orders reviewed and attested to.  Ann Held, DO

## 2017-05-07 NOTE — Patient Instructions (Signed)

## 2017-05-07 NOTE — Assessment & Plan Note (Signed)
Well controlled, no changes to meds. Encouraged heart healthy diet such as the DASH diet and exercise as tolerated.  °

## 2017-05-07 NOTE — Assessment & Plan Note (Signed)
hgba1c to be checked, minimize simple carbs. Increase exercise as tolerated. Continue current meds  

## 2017-05-10 ENCOUNTER — Encounter: Payer: Self-pay | Admitting: *Deleted

## 2017-06-10 ENCOUNTER — Telehealth: Payer: Self-pay | Admitting: *Deleted

## 2017-06-10 NOTE — Telephone Encounter (Signed)
Received request for Medical Records from [facility]; forwarded to [place] for email/scan/SLS 05/13

## 2017-07-04 DIAGNOSIS — E1169 Type 2 diabetes mellitus with other specified complication: Secondary | ICD-10-CM | POA: Diagnosis not present

## 2017-07-04 DIAGNOSIS — I10 Essential (primary) hypertension: Secondary | ICD-10-CM | POA: Diagnosis not present

## 2017-07-04 DIAGNOSIS — E7800 Pure hypercholesterolemia, unspecified: Secondary | ICD-10-CM | POA: Diagnosis not present

## 2017-07-04 DIAGNOSIS — I2510 Atherosclerotic heart disease of native coronary artery without angina pectoris: Secondary | ICD-10-CM | POA: Diagnosis not present

## 2017-07-16 DIAGNOSIS — E119 Type 2 diabetes mellitus without complications: Secondary | ICD-10-CM | POA: Diagnosis not present

## 2017-07-16 DIAGNOSIS — Z961 Presence of intraocular lens: Secondary | ICD-10-CM | POA: Diagnosis not present

## 2017-07-24 DIAGNOSIS — R69 Illness, unspecified: Secondary | ICD-10-CM | POA: Diagnosis not present

## 2017-09-12 DIAGNOSIS — I10 Essential (primary) hypertension: Secondary | ICD-10-CM | POA: Diagnosis not present

## 2017-10-23 DIAGNOSIS — R69 Illness, unspecified: Secondary | ICD-10-CM | POA: Diagnosis not present

## 2017-10-24 DIAGNOSIS — R69 Illness, unspecified: Secondary | ICD-10-CM | POA: Diagnosis not present

## 2017-11-01 ENCOUNTER — Encounter: Payer: Self-pay | Admitting: *Deleted

## 2017-11-08 DIAGNOSIS — J208 Acute bronchitis due to other specified organisms: Secondary | ICD-10-CM | POA: Diagnosis not present

## 2017-11-08 DIAGNOSIS — J029 Acute pharyngitis, unspecified: Secondary | ICD-10-CM | POA: Diagnosis not present

## 2017-11-20 ENCOUNTER — Other Ambulatory Visit: Payer: Self-pay | Admitting: Family Medicine

## 2017-11-20 DIAGNOSIS — R69 Illness, unspecified: Secondary | ICD-10-CM | POA: Diagnosis not present

## 2017-12-10 DIAGNOSIS — I10 Essential (primary) hypertension: Secondary | ICD-10-CM | POA: Diagnosis not present

## 2017-12-10 DIAGNOSIS — E7800 Pure hypercholesterolemia, unspecified: Secondary | ICD-10-CM | POA: Diagnosis not present

## 2017-12-10 DIAGNOSIS — Z7984 Long term (current) use of oral hypoglycemic drugs: Secondary | ICD-10-CM | POA: Diagnosis not present

## 2017-12-10 DIAGNOSIS — E1169 Type 2 diabetes mellitus with other specified complication: Secondary | ICD-10-CM | POA: Diagnosis not present

## 2018-01-06 DIAGNOSIS — M5416 Radiculopathy, lumbar region: Secondary | ICD-10-CM | POA: Diagnosis not present

## 2018-01-06 DIAGNOSIS — M48062 Spinal stenosis, lumbar region with neurogenic claudication: Secondary | ICD-10-CM | POA: Diagnosis not present

## 2018-01-06 DIAGNOSIS — M4316 Spondylolisthesis, lumbar region: Secondary | ICD-10-CM | POA: Diagnosis not present

## 2018-01-06 DIAGNOSIS — M545 Low back pain: Secondary | ICD-10-CM | POA: Diagnosis not present

## 2018-01-07 ENCOUNTER — Telehealth: Payer: Self-pay | Admitting: Internal Medicine

## 2018-01-07 NOTE — Telephone Encounter (Signed)
The patient last saw Dr. Saunders Revel on 03/01/17 and was supposed to come back for a 6 month follow up in August 2019.  Will forward a message to scheduling to please contact the patient to come in for a follow up appointment with Dr. Saunders Revel APP to discuss her clearance.

## 2018-01-07 NOTE — Telephone Encounter (Signed)
Scheduled 12/31 with Thurmond Butts in [place]

## 2018-01-07 NOTE — Telephone Encounter (Signed)
° °  [facility] Pre-operative Risk Assessment    Request for surgical clearance:  1. What type of surgery is being performed? L4-5 Maximum access posterior lumbar interbody fusion   2. When is this surgery scheduled? TBD   3. What type of clearance is required (medical clearance vs. Pharmacy clearance to hold med vs. Both)?   4. Are there any medications that need to be held prior to surgery and how long?    5. Practice name and name of physician performing surgery? [facility], Dr. Erline Levine  6. What is your office phone number     7.   What is your office fax number [phone]  8.   Anesthesia type (None, local, MAC, general) ? None listed   Eddie Mcdaniel 01/07/2018, 4:11 PM  _________________________________________________________________   (provider comments below)

## 2018-01-13 DIAGNOSIS — R972 Elevated prostate specific antigen [PSA]: Secondary | ICD-10-CM | POA: Diagnosis not present

## 2018-01-16 DIAGNOSIS — R972 Elevated prostate specific antigen [PSA]: Secondary | ICD-10-CM | POA: Diagnosis not present

## 2018-01-16 DIAGNOSIS — N401 Enlarged prostate with lower urinary tract symptoms: Secondary | ICD-10-CM | POA: Diagnosis not present

## 2018-01-16 DIAGNOSIS — R351 Nocturia: Secondary | ICD-10-CM | POA: Diagnosis not present

## 2018-01-21 NOTE — Progress Notes (Signed)
Cardiology Office Note Date:  01/28/2018  Patient ID:  Eddie Mcdaniel, Eddie Mcdaniel 1940/06/10, MRN [id number] PCP:  Ann Held, DO  Cardiologist:  Dr. Saunders Revel, MD    Chief Complaint: Pre-operative cardiac evaluation   History of Present Illness: Eddie Mcdaniel is a 77 y.o. male with history of CAD s/p PCI to the LAD in 10/2015, DM2, HTN, HLD, sleep apnea not interested in CPAP, and GERD who presents for pre-operative cardiac evaluation.   Patient was seen in 09/2015 for symptoms concerning for unstable angina.  Echocardiogram at that time showed an EF of 60 to 65%, mild LVH, no regional wall motion abnormalities, grade 1 diastolic dysfunction, trivial aortic regurgitation, mildly elevated PASP.  He underwent Myoview stress testing in 10/2015 that was high risk with prior apical and heart and moderate to severe apical ischemia with an EF of 54% with mild apical hypokinesis.  In this setting, he underwent LHC in 10/2015 showed a diffusely diseased LAD with a long segment of severe stenosis up to 95% in the mid segment, which was successfully treated with PCI/DES. LHC otherwise showed moderate to severe ostial diagonal disease with a tiny diagonal jailed by the stent. Given small vessel size, PCI was not performed at this lesion. He has moderate nonobstructive disease involving the mid RCA. EF was 55-65%. He was previously noted to be mildly anemic with stool sample being positive for occult blood. He was last seen in the office in 03/2017 and was doing well.  Labs: 04/2017 - LFT normal, SCr 0.73, K+ 5.1, LDL 42, A1c 6.5 09/2016 - HGB 10.1  Patient is planning to undergo L4-5 fusion, with date to be determined under the care of Dr. Sherley Bounds.  Office phone number is [phone] with an office fax number of [phone].  He comes in accompanied by his wife today and is doing well from a cardiac standpoint.  He has not had any symptoms concerning for angina, shortness of breath,  palpitations, dizziness, presyncope, or syncope.  He indicates he and his wife walk in their neighborhood for at least 45 minutes/day, 7 days/week.  In this setting, he has noted some low back pain with radiculopathy down the bilateral lower extremities that typically improves with 5 to 7 minutes of rest allowing him to continue on his walk.  He has been seen by neurosurgery for this with plans for L4-5 fusion as outlined above.  He is able to achieve at least 18.95 METs per the [facility] Activity Status Index.  His blood pressure typically runs in the 532-992 systolic range.  He indicates his blood pressure is a little bit elevated today given he typically does not drive [address] and had to come out to the [place] area today for his appointment.  He prefers to have his care locally in [place].  In this setting, he was stressed out.  He is compliant with all medications including aspirin, Lipitor, Coreg, amlodipine, metformin, and turmeric.  He would prefer to transition his cardiology care back to [facility] given he prefers not to drive out to the [place] area.  He otherwise does not have any issues or concerns today.  Past Medical History:  Diagnosis Date  . Contrast media allergy   . Coronary artery disease    a. USA/abnl nuc s/p  LHC 11/18/15 showed diffusely disease LAD s/p DES to LAD, mod-severe ostial diagonal disease with tiny diag jailed by stent - given small size, PCI not performed, mod nonobstructive mRCA disease, EF 55-65%.  Marland Kitchen  GERD (gastroesophageal reflux disease)   . Hx of adenomatous polyp of colon 10/06/2015  . Hyperlipidemia   . Hypertension   . Type II diabetes mellitus ([place])     Past Surgical History:  Procedure Laterality Date  . CARDIAC CATHETERIZATION N/A 11/17/2015   Procedure: Left Heart Cath and Coronary Angiography;  Surgeon: Nelva Bush, MD;  Location: [facility];  Service: Cardiovascular;  Laterality: N/A;  . CARDIAC CATHETERIZATION N/A 11/17/2015    Procedure: Coronary Stent Intervention;  Surgeon: Nelva Bush, MD;  Location: [facility];  Service: Cardiovascular;  Laterality: N/A;  . CATARACT EXTRACTION W/ INTRAOCULAR LENS  IMPLANT, BILATERAL Bilateral 2005  . CORONARY ANGIOPLASTY    . SHOULDER ARTHROSCOPY W/ ROTATOR CUFF REPAIR Right 2000s X 1  . SHOULDER OPEN ROTATOR CUFF REPAIR Right 2000s X 1   "after 1st OR didn't work; had to put tissue in this time"    Current Meds  Medication Sig  . amLODipine (NORVASC) 5 MG tablet Take 5 mg by mouth daily.  Marland Kitchen aspirin EC 81 MG tablet Take 1 tablet (81 mg total) by mouth daily.  Marland Kitchen atorvastatin (LIPITOR) 20 MG tablet TAKE 1 TABLET EVERY EVENING  . blood glucose meter kit and supplies KIT One Touch Meter and Strips supplies for BID testing Dx: E11.9  . carvedilol (COREG) 12.5 MG tablet TAKE 1 TABLET TWICE A DAY  WITH A MEAL  . CINNAMON PO Take by mouth See admin instructions. Takes "half a spoonful" once a day.  Marland Kitchen GARLIC PO Take 1 capsule by mouth daily.  Marland Kitchen glucose blood (ONETOUCH VERIO) test strip Use as instructed once a day.  Dx: E11.65.  Need ov/lab  . metFORMIN (GLUCOPHAGE-XR) 750 MG 24 hr tablet TAKE 1 TABLET TWICE A DAY  . ONE TOUCH LANCETS MISC UAD BID for Dx: E11.9  . Turmeric POWD Take by mouth See admin instructions. Takes "half a spoonful" once a day.    Allergies:   Ace inhibitors and Contrast media [iodinated diagnostic agents]   Social History:  The patient  reports that he has never smoked. He quit smokeless tobacco use about 7 years ago.  His smokeless tobacco use included chew. He reports that he does not drink alcohol or use drugs.   Family History:  The patient's family history includes Cancer (age of onset: 78) in his brother; Diabetes in his brother and mother; Heart disease in his brother and brother; Hyperlipidemia in his brother; Hypertension in his brother and mother; Stroke in his brother.  ROS:   Review of Systems  Constitutional: Negative for chills,  diaphoresis, fever, malaise/fatigue and weight loss.  HENT: Negative for congestion.   Eyes: Negative for discharge and redness.  Respiratory: Negative for cough, hemoptysis, sputum production, shortness of breath and wheezing.   Cardiovascular: Negative for chest pain, palpitations, orthopnea, claudication, leg swelling and PND.  Gastrointestinal: Negative for abdominal pain, blood in stool, heartburn, melena, nausea and vomiting.  Genitourinary: Negative for hematuria.  Musculoskeletal: Positive for back pain. Negative for falls and myalgias.  Skin: Negative for rash.  Neurological: Negative for dizziness, tingling, tremors, sensory change, speech change, focal weakness, loss of consciousness and weakness.  Endo/Heme/Allergies: Does not bruise/bleed easily.  Psychiatric/Behavioral: Negative for substance abuse. The patient is not nervous/anxious.   All other systems reviewed and are negative.    PHYSICAL EXAM:  VS:  BP (!) 150/62 (BP Location: Left Arm, Patient Position: Sitting, Cuff Size: Normal)   Pulse (!) 58   Ht  $'5\' 5"'g$  (1.651 m)   Wt 149 lb 8 oz (67.8 kg)   BMI 24.88 kg/m  BMI: Body mass index is 24.88 kg/m.  Physical Exam  Constitutional: He is oriented to person, place, and time. He appears well-developed and well-nourished.  HENT:  Head: Normocephalic and atraumatic.  Eyes: Right eye exhibits no discharge. Left eye exhibits no discharge.  Neck: Normal range of motion. No JVD present.  Cardiovascular: Normal rate, regular rhythm, S1 normal, S2 normal and normal heart sounds. Exam reveals no distant heart sounds, no friction rub, no midsystolic click and no opening snap.  No murmur heard. Pulses:      Posterior tibial pulses are 2+ on the right side and 2+ on the left side.  Pulmonary/Chest: Effort normal and breath sounds normal. No respiratory distress. He has no decreased breath sounds. He has no wheezes. He has no rales. He exhibits no tenderness.  Abdominal: Soft. He  exhibits no distension. There is no abdominal tenderness.  Musculoskeletal:        General: No edema.  Neurological: He is alert and oriented to person, place, and time.  Skin: Skin is warm and dry. No cyanosis. Nails show no clubbing.  Psychiatric: He has a normal mood and affect. His speech is normal and behavior is normal. Judgment and thought content normal.     EKG:  Was ordered and interpreted by me today. Shows sinus bradycardia, 58 bpm, normal axis, no acute ST-T changes  Recent Labs: 05/07/2017: ALT 16; BUN 8; Creatinine, Ser 0.73; Potassium 5.1; Sodium 135  05/07/2017: Cholesterol 91; HDL 33.90; LDL Cholesterol 42; Total CHOL/HDL Ratio 3; Triglycerides 77.0; VLDL 15.4   CrCl cannot be calculated (Patient's most recent lab result is older than the maximum 21 days allowed.).   Wt Readings from Last 3 Encounters:  01/28/18 149 lb 8 oz (67.8 kg)  05/07/17 148 lb 3.2 oz (67.2 kg)  03/01/17 148 lb 9.6 oz (67.4 kg)     Other studies reviewed: Additional studies/records reviewed today include: summarized above  ASSESSMENT AND PLAN:  1. Pre-operative cardiac evaluation: Patient is planning to undergo L4-5 lumbar fusion with date to be determined.  He is well compensated and without any symptoms concerning for angina.  Per Modified Lee Criteria he would be low risk for noncardiac surgery.  No further cardiac testing is needed at this time.  Please fax this office note to the patient's neurosurgeon Dr. Ronnald Ramp at [phone].  2. CAD involving the native coronary arteries without angina: He is doing well without any symptoms concerning for angina..  Remains on aspirin, Lipitor, and Coreg.  Continue aggressive secondary prevention.  No plans for further ischemic evaluation at this time.  3. HTN: Blood pressure is suboptimally controlled today at 150/62.  However, the patient indicates this is in the setting of increased stress as he could not find our office and typically does not drive  [place].  Blood pressure typically runs 283-662 systolic.  He will keep an eye on his blood pressure and if he notes consistently elevated readings he will contact our office for titration of antihypertensive therapy.  Otherwise, he will remain on current doses of carvedilol and amlodipine.  4. HLD: LDL from 04/2017 of 42 with normal liver function at that time.  Remains on Lipitor.  5. Sleep apnea: Not interested in CPAP.  Disposition: Patient was previously followed by Dr. Saunders Revel in [place] at our [facility] office.  With Dr. Darnelle Bos relocation to the [place] office the  patient prefers to transition his cardiology care back to [facility].  I will notify Dr. Saunders Revel of the patient's wishes and we will go ahead and get the patient scheduled with 1 of our cardiologists to be established at our [facility] office within the next 6 months.  Current medicines are reviewed at length with the patient today.  The patient did not have any concerns regarding medicines.  Signed, Christell Faith, PA-C 01/28/2018 11:10 AM     [facility] - [address], [place] [address] [phone]

## 2018-01-28 ENCOUNTER — Encounter: Payer: Self-pay | Admitting: Physician Assistant

## 2018-01-28 ENCOUNTER — Ambulatory Visit: Payer: Medicare HMO | Admitting: Physician Assistant

## 2018-01-28 VITALS — BP 150/62 | HR 58 | Ht 65.0 in | Wt 149.5 lb

## 2018-01-28 DIAGNOSIS — E785 Hyperlipidemia, unspecified: Secondary | ICD-10-CM | POA: Diagnosis not present

## 2018-01-28 DIAGNOSIS — I2510 Atherosclerotic heart disease of native coronary artery without angina pectoris: Secondary | ICD-10-CM | POA: Diagnosis not present

## 2018-01-28 DIAGNOSIS — G4733 Obstructive sleep apnea (adult) (pediatric): Secondary | ICD-10-CM | POA: Diagnosis not present

## 2018-01-28 DIAGNOSIS — Z01810 Encounter for preprocedural cardiovascular examination: Secondary | ICD-10-CM | POA: Diagnosis not present

## 2018-01-28 DIAGNOSIS — I10 Essential (primary) hypertension: Secondary | ICD-10-CM | POA: Diagnosis not present

## 2018-01-28 NOTE — Patient Instructions (Signed)
Medication Instructions:  No changes If you need a refill on your cardiac medications before your next appointment, please call your pharmacy.   Lab work: None ordered  Testing/Procedures: None ordered  Follow-Up: At [facility], you and your health needs are our priority.  As part of our continuing mission to provide you with exceptional heart care, we have created designated Provider Care Teams.  These Care Teams include your primary Cardiologist (physician) and Advanced Practice Providers (APPs -  Physician Assistants and Nurse Practitioners) who all work together to provide you with the care you need, when you need it.  Follow up in 6 months with a provider at the [address] office.

## 2018-03-26 DIAGNOSIS — M545 Low back pain: Secondary | ICD-10-CM | POA: Diagnosis not present

## 2018-03-26 DIAGNOSIS — M4316 Spondylolisthesis, lumbar region: Secondary | ICD-10-CM | POA: Diagnosis not present

## 2018-03-31 ENCOUNTER — Encounter: Payer: Self-pay | Admitting: Internal Medicine

## 2018-03-31 DIAGNOSIS — M4316 Spondylolisthesis, lumbar region: Secondary | ICD-10-CM | POA: Diagnosis not present

## 2018-03-31 DIAGNOSIS — M48062 Spinal stenosis, lumbar region with neurogenic claudication: Secondary | ICD-10-CM | POA: Diagnosis not present

## 2018-03-31 DIAGNOSIS — M5416 Radiculopathy, lumbar region: Secondary | ICD-10-CM | POA: Diagnosis not present

## 2018-03-31 DIAGNOSIS — M545 Low back pain: Secondary | ICD-10-CM | POA: Diagnosis not present

## 2018-03-31 NOTE — Telephone Encounter (Signed)
This encounter was created in error - please disregard.

## 2018-03-31 NOTE — Telephone Encounter (Signed)
Please call to discuss if clearance was sent to Dr. Vertell Limber.

## 2018-04-01 NOTE — Telephone Encounter (Signed)
Spoke with the patient. Adv pt that the note of clearance would have been faxed back in Dec 2019. I have re-faxed Christell Faith, PA 01/28/18 office note to Dr.Sterns' office via [place]. Patient voiced appreciation for the assistance.

## 2018-04-03 ENCOUNTER — Other Ambulatory Visit: Payer: Self-pay | Admitting: Neurosurgery

## 2018-05-08 ENCOUNTER — Inpatient Hospital Stay: Admit: 2018-05-08 | Payer: Medicare HMO | Admitting: Neurosurgery

## 2018-05-08 SURGERY — FOR MAXIMUM ACCESS (MAS) POSTERIOR LUMBAR INTERBODY FUSION (PLIF) 1 LEVEL
Anesthesia: General | Site: Back

## 2018-06-03 DIAGNOSIS — I2510 Atherosclerotic heart disease of native coronary artery without angina pectoris: Secondary | ICD-10-CM | POA: Diagnosis not present

## 2018-06-03 DIAGNOSIS — I10 Essential (primary) hypertension: Secondary | ICD-10-CM | POA: Diagnosis not present

## 2018-06-03 DIAGNOSIS — Z1389 Encounter for screening for other disorder: Secondary | ICD-10-CM | POA: Diagnosis not present

## 2018-06-03 DIAGNOSIS — E1169 Type 2 diabetes mellitus with other specified complication: Secondary | ICD-10-CM | POA: Diagnosis not present

## 2018-06-03 DIAGNOSIS — E7800 Pure hypercholesterolemia, unspecified: Secondary | ICD-10-CM | POA: Diagnosis not present

## 2018-06-03 DIAGNOSIS — K219 Gastro-esophageal reflux disease without esophagitis: Secondary | ICD-10-CM | POA: Diagnosis not present

## 2018-06-03 DIAGNOSIS — Z7984 Long term (current) use of oral hypoglycemic drugs: Secondary | ICD-10-CM | POA: Diagnosis not present

## 2018-06-03 DIAGNOSIS — Z0000 Encounter for general adult medical examination without abnormal findings: Secondary | ICD-10-CM | POA: Diagnosis not present

## 2018-06-17 DIAGNOSIS — R710 Precipitous drop in hematocrit: Secondary | ICD-10-CM | POA: Diagnosis not present

## 2018-06-17 DIAGNOSIS — Z1211 Encounter for screening for malignant neoplasm of colon: Secondary | ICD-10-CM | POA: Diagnosis not present

## 2018-07-17 DIAGNOSIS — Z961 Presence of intraocular lens: Secondary | ICD-10-CM | POA: Diagnosis not present

## 2018-07-17 DIAGNOSIS — H31093 Other chorioretinal scars, bilateral: Secondary | ICD-10-CM | POA: Diagnosis not present

## 2018-07-17 DIAGNOSIS — E119 Type 2 diabetes mellitus without complications: Secondary | ICD-10-CM | POA: Diagnosis not present

## 2018-07-17 DIAGNOSIS — H1045 Other chronic allergic conjunctivitis: Secondary | ICD-10-CM | POA: Diagnosis not present

## 2018-10-15 ENCOUNTER — Emergency Department (HOSPITAL_COMMUNITY): Payer: Medicare HMO

## 2018-10-15 ENCOUNTER — Encounter (HOSPITAL_COMMUNITY): Payer: Self-pay | Admitting: Internal Medicine

## 2018-10-15 ENCOUNTER — Other Ambulatory Visit: Payer: Self-pay

## 2018-10-15 ENCOUNTER — Inpatient Hospital Stay (HOSPITAL_COMMUNITY)
Admission: EM | Admit: 2018-10-15 | Discharge: 2018-11-04 | DRG: 177 | Disposition: A | Discharge status: Home Health | Payer: Medicare HMO | Attending: Internal Medicine | Admitting: Internal Medicine

## 2018-10-15 DIAGNOSIS — Z91041 Radiographic dye allergy status: Secondary | ICD-10-CM | POA: Diagnosis not present

## 2018-10-15 DIAGNOSIS — Z7982 Long term (current) use of aspirin: Secondary | ICD-10-CM | POA: Diagnosis not present

## 2018-10-15 DIAGNOSIS — I10 Essential (primary) hypertension: Secondary | ICD-10-CM | POA: Diagnosis present

## 2018-10-15 DIAGNOSIS — E871 Hypo-osmolality and hyponatremia: Secondary | ICD-10-CM | POA: Diagnosis not present

## 2018-10-15 DIAGNOSIS — Z23 Encounter for immunization: Secondary | ICD-10-CM | POA: Diagnosis not present

## 2018-10-15 DIAGNOSIS — K219 Gastro-esophageal reflux disease without esophagitis: Secondary | ICD-10-CM | POA: Diagnosis present

## 2018-10-15 DIAGNOSIS — Z79899 Other long term (current) drug therapy: Secondary | ICD-10-CM

## 2018-10-15 DIAGNOSIS — IMO0001 Reserved for inherently not codable concepts without codable children: Secondary | ICD-10-CM | POA: Diagnosis present

## 2018-10-15 DIAGNOSIS — R0902 Hypoxemia: Secondary | ICD-10-CM | POA: Diagnosis not present

## 2018-10-15 DIAGNOSIS — Z87891 Personal history of nicotine dependence: Secondary | ICD-10-CM | POA: Diagnosis not present

## 2018-10-15 DIAGNOSIS — T380X5A Adverse effect of glucocorticoids and synthetic analogues, initial encounter: Secondary | ICD-10-CM | POA: Diagnosis present

## 2018-10-15 DIAGNOSIS — J1289 Other viral pneumonia: Secondary | ICD-10-CM | POA: Diagnosis not present

## 2018-10-15 DIAGNOSIS — E11649 Type 2 diabetes mellitus with hypoglycemia without coma: Secondary | ICD-10-CM | POA: Diagnosis not present

## 2018-10-15 DIAGNOSIS — J90 Pleural effusion, not elsewhere classified: Secondary | ICD-10-CM | POA: Diagnosis present

## 2018-10-15 DIAGNOSIS — J9811 Atelectasis: Secondary | ICD-10-CM | POA: Diagnosis not present

## 2018-10-15 DIAGNOSIS — I2510 Atherosclerotic heart disease of native coronary artery without angina pectoris: Secondary | ICD-10-CM | POA: Diagnosis present

## 2018-10-15 DIAGNOSIS — R918 Other nonspecific abnormal finding of lung field: Secondary | ICD-10-CM | POA: Diagnosis not present

## 2018-10-15 DIAGNOSIS — Z955 Presence of coronary angioplasty implant and graft: Secondary | ICD-10-CM | POA: Diagnosis not present

## 2018-10-15 DIAGNOSIS — E1165 Type 2 diabetes mellitus with hyperglycemia: Secondary | ICD-10-CM

## 2018-10-15 DIAGNOSIS — J9600 Acute respiratory failure, unspecified whether with hypoxia or hypercapnia: Secondary | ICD-10-CM | POA: Diagnosis not present

## 2018-10-15 DIAGNOSIS — E785 Hyperlipidemia, unspecified: Secondary | ICD-10-CM | POA: Diagnosis not present

## 2018-10-15 DIAGNOSIS — J189 Pneumonia, unspecified organism: Secondary | ICD-10-CM | POA: Diagnosis not present

## 2018-10-15 DIAGNOSIS — Z8249 Family history of ischemic heart disease and other diseases of the circulatory system: Secondary | ICD-10-CM | POA: Diagnosis not present

## 2018-10-15 DIAGNOSIS — G4733 Obstructive sleep apnea (adult) (pediatric): Secondary | ICD-10-CM | POA: Diagnosis present

## 2018-10-15 DIAGNOSIS — J069 Acute upper respiratory infection, unspecified: Secondary | ICD-10-CM | POA: Diagnosis not present

## 2018-10-15 DIAGNOSIS — R945 Abnormal results of liver function studies: Secondary | ICD-10-CM | POA: Diagnosis not present

## 2018-10-15 DIAGNOSIS — J80 Acute respiratory distress syndrome: Secondary | ICD-10-CM | POA: Diagnosis not present

## 2018-10-15 DIAGNOSIS — U071 COVID-19: Secondary | ICD-10-CM | POA: Diagnosis not present

## 2018-10-15 DIAGNOSIS — J9601 Acute respiratory failure with hypoxia: Secondary | ICD-10-CM | POA: Diagnosis not present

## 2018-10-15 DIAGNOSIS — R7989 Other specified abnormal findings of blood chemistry: Secondary | ICD-10-CM | POA: Diagnosis not present

## 2018-10-15 DIAGNOSIS — Z7984 Long term (current) use of oral hypoglycemic drugs: Secondary | ICD-10-CM | POA: Diagnosis not present

## 2018-10-15 DIAGNOSIS — R5383 Other fatigue: Secondary | ICD-10-CM | POA: Diagnosis not present

## 2018-10-15 DIAGNOSIS — R0603 Acute respiratory distress: Secondary | ICD-10-CM | POA: Diagnosis not present

## 2018-10-15 LAB — CBC WITH DIFFERENTIAL/PLATELET
Abs Immature Granulocytes: 0.02 10*3/uL (ref 0.00–0.07)
Basophils Absolute: 0 10*3/uL (ref 0.0–0.1)
Basophils Relative: 0 %
Eosinophils Absolute: 0 10*3/uL (ref 0.0–0.5)
Eosinophils Relative: 1 %
HCT: 31 % — ABNORMAL LOW (ref 39.0–52.0)
Hemoglobin: 11.3 g/dL — ABNORMAL LOW (ref 13.0–17.0)
Immature Granulocytes: 0 %
Lymphocytes Relative: 20 %
Lymphs Abs: 1 10*3/uL (ref 0.7–4.0)
MCH: 31.9 pg (ref 26.0–34.0)
MCHC: 36.5 g/dL — ABNORMAL HIGH (ref 30.0–36.0)
MCV: 87.6 fL (ref 80.0–100.0)
Monocytes Absolute: 0.5 10*3/uL (ref 0.1–1.0)
Monocytes Relative: 9 %
Neutro Abs: 3.5 10*3/uL (ref 1.7–7.7)
Neutrophils Relative %: 70 %
Platelets: 127 10*3/uL — ABNORMAL LOW (ref 150–400)
RBC: 3.54 MIL/uL — ABNORMAL LOW (ref 4.22–5.81)
RDW: 12.4 % (ref 11.5–15.5)
WBC: 5 10*3/uL (ref 4.0–10.5)
nRBC: 0 % (ref 0.0–0.2)

## 2018-10-15 LAB — COMPREHENSIVE METABOLIC PANEL
ALT: 26 U/L (ref 0–44)
AST: 48 U/L — ABNORMAL HIGH (ref 15–41)
Albumin: 2.9 g/dL — ABNORMAL LOW (ref 3.5–5.0)
Alkaline Phosphatase: 39 U/L (ref 38–126)
Anion gap: 10 (ref 5–15)
BUN: 15 mg/dL (ref 8–23)
CO2: 21 mmol/L — ABNORMAL LOW (ref 22–32)
Calcium: 8 mg/dL — ABNORMAL LOW (ref 8.9–10.3)
Chloride: 103 mmol/L (ref 98–111)
Creatinine, Ser: 0.85 mg/dL (ref 0.61–1.24)
GFR calc Af Amer: >60 mL/min (ref >60)
GFR calc non Af Amer: >60 mL/min (ref >60)
Glucose, Bld: 141 mg/dL — ABNORMAL HIGH (ref 70–99)
Potassium: 4.3 mmol/L (ref 3.5–5.1)
Sodium: 134 mmol/L — ABNORMAL LOW (ref 135–145)
Total Bilirubin: 1.2 mg/dL (ref 0.3–1.2)
Total Protein: 6.3 g/dL — ABNORMAL LOW (ref 6.5–8.1)

## 2018-10-15 LAB — SARS CORONAVIRUS 2 BY RT PCR (HOSPITAL ORDER, PERFORMED IN ~~LOC~~ HOSPITAL LAB): SARS Coronavirus 2: POSITIVE — AB

## 2018-10-15 LAB — URINALYSIS, ROUTINE W REFLEX MICROSCOPIC
Bilirubin Urine: NEGATIVE
Glucose, UA: NEGATIVE mg/dL
Hgb urine dipstick: NEGATIVE
Ketones, ur: NEGATIVE mg/dL
Leukocytes,Ua: NEGATIVE
Nitrite: NEGATIVE
Protein, ur: 30 mg/dL — AB
Specific Gravity, Urine: 1.019 (ref 1.005–1.030)
pH: 5 (ref 5.0–8.0)

## 2018-10-15 LAB — CBG MONITORING, ED: Glucose-Capillary: 176 mg/dL — ABNORMAL HIGH (ref 70–99)

## 2018-10-15 LAB — LACTIC ACID, PLASMA: Lactic Acid, Venous: 1.5 mmol/L (ref 0.5–1.9)

## 2018-10-15 LAB — FERRITIN: Ferritin: 365 ng/mL — ABNORMAL HIGH (ref 24–336)

## 2018-10-15 LAB — LACTATE DEHYDROGENASE: LDH: 320 U/L — ABNORMAL HIGH (ref 98–192)

## 2018-10-15 LAB — C-REACTIVE PROTEIN: CRP: 9.1 mg/dL — ABNORMAL HIGH (ref <1.0)

## 2018-10-15 LAB — TRIGLYCERIDES: Triglycerides: 97 mg/dL (ref <150)

## 2018-10-15 LAB — PROCALCITONIN: Procalcitonin: <0.1 ng/mL

## 2018-10-15 LAB — FIBRINOGEN: Fibrinogen: 475 mg/dL (ref 210–475)

## 2018-10-15 LAB — D-DIMER, QUANTITATIVE: D-Dimer, Quant: 0.57 ug/mL-FEU — ABNORMAL HIGH (ref 0.00–0.50)

## 2018-10-15 MED ORDER — INSULIN ASPART 100 UNIT/ML ~~LOC~~ SOLN
0.0000 [IU] | Freq: Three times a day (TID) | SUBCUTANEOUS | Status: DC
  Administered 2018-10-16: 3 [IU] via SUBCUTANEOUS
  Administered 2018-10-16: 9 [IU] via SUBCUTANEOUS

## 2018-10-15 MED ORDER — ENOXAPARIN SODIUM 40 MG/0.4ML ~~LOC~~ SOLN: 40.0000 mg | SUBCUTANEOUS | Status: DC

## 2018-10-15 MED ORDER — DEXAMETHASONE SODIUM PHOSPHATE 10 MG/ML IJ SOLN
6.0000 mg | INTRAMUSCULAR | Status: AC
  Administered 2018-10-16 – 2018-10-24 (×10): 6 mg via INTRAVENOUS
  Filled 2018-10-15 (×10): qty 1

## 2018-10-15 MED ORDER — INSULIN ASPART 100 UNIT/ML ~~LOC~~ SOLN: 0.0000 [IU] | Freq: Every day | SUBCUTANEOUS | Status: DC

## 2018-10-15 MED ORDER — SODIUM CHLORIDE 0.9% FLUSH: 3.0000 mL | Freq: Once | INTRAVENOUS | Status: DC

## 2018-10-15 MED ORDER — ACETAMINOPHEN 325 MG PO TABS
650.0000 mg | ORAL_TABLET | Freq: Four times a day (QID) | ORAL | Status: DC | PRN
  Administered 2018-10-19 – 2018-11-03 (×8): 650 mg via ORAL
  Filled 2018-10-15 (×8): qty 2

## 2018-10-15 MED ORDER — SODIUM CHLORIDE 0.9 % IV BOLUS
1000.0000 mL | Freq: Once | INTRAVENOUS | Status: AC
  Administered 2018-10-15: 1000 mL via INTRAVENOUS

## 2018-10-15 NOTE — ED Notes (Signed)
Called pt son Konrad Dolores [phone], left msg.

## 2018-10-15 NOTE — ED Notes (Signed)
Pt reminded of the need for urine and provided a urinal

## 2018-10-15 NOTE — H&P (Addendum)
TRH H&P    Patient Demographics:    Eddie Mcdaniel, is a 78 y.o. male  MRN: [id number]  DOB - 08/15/1940  Admit Date - 10/15/2018  Referring MD/NP/PA:  Delia Heady  Outpatient Primary MD for the patient is Carollee Herter, Alferd Apa, DO  Patient coming from:  home  Chief complaint-  Feeling sick   HPI:    Eddie Mcdaniel  is a 78 y.o. male,  w hypertension, hyperlipidemia, Dm2, CAD, Jerrye Bushy, apparently presents with c/o feeling sick, fever last week, and has had poor po intake and poor energy,  and odd bitter taste in his mouth. Pt denies any travel or covid exposure.  Went to [employer] in clinic and was told to go to ER.   In Ed,  T 98.6, P 61  R 18, Bp 99/52  Pox 92% on RA, 89% on RA  CXR IMPRESSION: Patchy airspace opacity in the right mid lung in each lower lobe, likely multifocal pneumonia. No consolidation in these areas of apparent pneumonia. There is left base atelectasis.  Suspect minimal left pleural effusion. Heart mildly enlarged with pulmonary vascularity normal. No evident adenopathy.  Covid- 19 positive  Urinalysis prot 30, wbc 0-5, rbc 0-5 Lactic acid 1.5 Tg 97 Wbc 5.0, hgb 11.3, Plt 127 Na 134, K 4.3, Bun 15, Creatinine 0.85 Ast 48, Alt 26 D dimer 0.57 LDH 320 Crp 9.1  Ferritin 365 procalcitonin  Pending Blood culture x2 pending   Pt will be admitted for acute respiratory failure with hypoxia secondary to Covid -19, CAP       Review of systems:    In addition to the HPI above,  No Fever-chills, No Headache, No changes with Vision or hearing, No problems swallowing food or Liquids, No Chest pain, pt denies cough, but has dry cough while Im in the room.  No Abdominal pain, No Nausea or Vomiting, bowel movements are regular, No Blood in stool or Urine, No dysuria, No new skin rashes or bruises, No new joints pains-aches,  No new weakness, tingling, numbness in  any extremity, No recent weight gain or loss, No polyuria, polydypsia or polyphagia, No significant Mental Stressors.  All other systems reviewed and are negative.    Past History of the following :    Past Medical History:  Diagnosis Date   Contrast media allergy    Coronary artery disease    a. USA/abnl nuc s/p  LHC 11/18/15 showed diffusely disease LAD s/p DES to LAD, mod-severe ostial diagonal disease with tiny diag jailed by stent - given small size, PCI not performed, mod nonobstructive mRCA disease, EF 55-65%.   GERD (gastroesophageal reflux disease)    Hx of adenomatous polyp of colon 10/06/2015   Hyperlipidemia    Hypertension    Type II diabetes mellitus ([place])       Past Surgical History:  Procedure Laterality Date   CARDIAC CATHETERIZATION N/A 11/17/2015   Procedure: Left Heart Cath and Coronary Angiography;  Surgeon: Nelva Bush, MD;  Location: [facility];  Service: Cardiovascular;  Laterality: N/A;   CARDIAC CATHETERIZATION N/A 11/17/2015   Procedure: Coronary Stent Intervention;  Surgeon: Nelva Bush, MD;  Location: [facility];  Service: Cardiovascular;  Laterality: N/A;   CATARACT EXTRACTION W/ INTRAOCULAR LENS  IMPLANT, BILATERAL Bilateral 2005   CORONARY ANGIOPLASTY     SHOULDER ARTHROSCOPY W/ ROTATOR CUFF REPAIR Right 2000s X 1   SHOULDER OPEN ROTATOR CUFF REPAIR Right 2000s X 1   "after 1st OR didn't work; had to put tissue in this time"      Social History:      Social History   Tobacco Use   Smoking status: Never Smoker   Smokeless tobacco: Former Systems developer    Types: Loss adjuster, chartered  Substance Use Topics   Alcohol use: No       Family History :     Family History  Problem Relation Age of Onset   Diabetes Mother    Hypertension Mother    Cancer Brother 65       brain, stomach   Hyperlipidemia Brother    Heart disease Brother        MI   Heart disease Brother        MI   Diabetes Brother    Stroke  Brother    Hypertension Brother    Colon cancer Neg Hx    Esophageal cancer Neg Hx    Rectal cancer Neg Hx    Stomach cancer Neg Hx       Home Medications:   Prior to Admission medications   Medication Sig Start Date End Date Taking? Authorizing Provider  amLODipine (NORVASC) 5 MG tablet Take 5 mg by mouth daily.    [provider]  aspirin EC 81 MG tablet Take 1 tablet (81 mg total) by mouth daily. 11/18/15   Dunn, Nedra Hai, PA-C  atorvastatin (LIPITOR) 20 MG tablet TAKE 1 TABLET EVERY EVENING 12/19/16   Roma Schanz R, DO  blood glucose meter kit and supplies KIT One Touch Meter and Strips supplies for BID testing Dx: E11.9 10/25/16   Carollee Herter, Alferd Apa, DO  carvedilol (COREG) 12.5 MG tablet TAKE 1 TABLET TWICE A DAY  WITH A MEAL 12/19/16   Carollee Herter, Yvonne R, DO  CINNAMON PO Take by mouth See admin instructions. Takes "half a spoonful" once a day.    [provider]  GARLIC PO Take 1 capsule by mouth daily.    [provider]  glucose blood (ONETOUCH VERIO) test strip Use as instructed once a day.  Dx: E11.65.  Need ov/lab 11/20/17   Carollee Herter, Alferd Apa, DO  metFORMIN (GLUCOPHAGE-XR) 750 MG 24 hr tablet TAKE 1 TABLET TWICE A DAY 03/21/17   Carollee Herter, [place] R, DO  ONE TOUCH LANCETS MISC UAD BID for Dx: E11.9 10/25/16   Carollee Herter, Alferd Apa, DO  Turmeric POWD Take by mouth See admin instructions. Takes "half a spoonful" once a day.    [provider]     Allergies:     Allergies  Allergen Reactions   Ace Inhibitors Anaphylaxis    angioedema   Contrast Media [Iodinated Diagnostic Agents] Itching     Physical Exam:   Vitals  Blood pressure (!) 124/56, pulse 80, temperature 98.6 F (37 C), temperature source Oral, resp. rate (!) 27, SpO2 93 %.  1.  General: axox3   2. Psychiatric: euthymmic  3. Neurologic: cn2-12 intact, reflexes 2+ symmetric, diffuse with no clonus, motor 5/5 in all 4 ext, pinprik intact  4.  HEENMT:  Anicteric, pupils 1.15m symmetric, direct, consensual, near intact Neck: no jvd  5. Respiratory : + bibasilar crackles, no wheezing  6. Cardiovascular : rrr s1, s2, no m/g/r  7. Gastrointestinal:  Abd: soft, obese, nt, nd, +bs  8. Skin:  Ext: no c/c/e,  No rash  9.Musculoskeletal:  Good ROM     Data Review:    CBC Recent Labs  Lab 10/15/18 2053  WBC 5.0  HGB 11.3*  HCT 31.0*  PLT 127*  MCV 87.6  MCH 31.9  MCHC 36.5*  RDW 12.4  LYMPHSABS 1.0  MONOABS 0.5  EOSABS 0.0  BASOSABS 0.0   ------------------------------------------------------------------------------------------------------------------  Results for orders placed or performed during the hospital encounter of 10/15/18 (from the past 48 hour(s))  CBG monitoring, ED     Status: Abnormal   Collection Time: 10/15/18  4:04 PM  Result Value Ref Range   Glucose-Capillary 176 (H) 70 - 99 mg/dL   Comment 1 Notify RN    Comment 2 Document in Chart   SARS Coronavirus 2 ([facility], [facility]) Nasopharyngeal Nasopharyngeal Swab     Status: Abnormal   Collection Time: 10/15/18  5:10 PM   Specimen: Nasopharyngeal Swab  Result Value Ref Range   SARS Coronavirus 2 POSITIVE (A) NEGATIVE    Comment: RESULT CALLED TO, READ BACK BY AND VERIFIED WITH: EMarzella SchleinRN 1910 10/15/18 A BROWNING (NOTE) If result is NEGATIVE SARS-CoV-2 target nucleic acids are NOT DETECTED. The SARS-CoV-2 RNA is generally detectable in upper and lower  respiratory specimens during the acute phase of infection. The lowest  concentration of SARS-CoV-2 viral copies this assay can detect is 250  copies / mL. A negative result does not preclude SARS-CoV-2 infection  and should not be used as the sole basis for treatment or other  patient management decisions.  A negative result may occur with  improper specimen collection / handling, submission of specimen other  than nasopharyngeal swab, presence of viral  mutation(s) within the  areas targeted by this assay, and inadequate number of viral copies  (<250 copies / mL). A negative result must be combined with clinical  observations, patient history, and epidemiological information. If result is POSITIVE SARS-CoV-2 target nucleic acids are DETECTED. T he SARS-CoV-2 RNA is generally detectable in upper and lower  respiratory specimens during the acute phase of infection.  Positive  results are indicative of active infection with SARS-CoV-2.  Clinical  correlation with patient history and other diagnostic information is  necessary to determine patient infection status.  Positive results do  not rule out bacterial infection or co-infection with other viruses. If result is PRESUMPTIVE POSTIVE SARS-CoV-2 nucleic acids MAY BE PRESENT.   A presumptive positive result was obtained on the submitted specimen  and confirmed on repeat testing.  While 2019 novel coronavirus  (SARS-CoV-2) nucleic acids may be present in the submitted sample  additional confirmatory testing may be necessary for epidemiological  and / or clinical management purposes  to differentiate between  SARS-CoV-2 and other Sarbecovirus currently known to infect humans.  If clinically indicated additional testing with an alternate test  methodology ([phone] is  advised. The SARS-CoV-2 RNA is generally  detectable in upper and lower respiratory specimens during the acute  phase of infection. The expected result is Negative. Fact Sheet for Patients:  hStrictlyIdeas.noFact Sheet for Healthcare Providers: hBankingDealers.co.zaThis test is not yet approved or cleared by the [place] and has been authorized  for detection and/or diagnosis of SARS-CoV-2 by FDA under an Emergency Use Authorization (EUA).  This EUA will remain in effect (meaning this test can be used) for the duration of the COVID-19 declaration under Section 564(b)(1)  of the Act, 21 U.S.C. section 360bbb-3(b)(1), unless the authorization is terminated or revoked sooner. Performed at [facility], [place] [address], [place], [place] [address]   Urinalysis, Routine w reflex microscopic     Status: Abnormal   Collection Time: 10/15/18  5:30 PM  Result Value Ref Range   Color, Urine AMBER (A) YELLOW    Comment: BIOCHEMICALS MAY BE AFFECTED BY COLOR   APPearance HAZY (A) CLEAR   Specific Gravity, Urine 1.019 1.005 - 1.030   pH 5.0 5.0 - 8.0   Glucose, UA NEGATIVE NEGATIVE mg/dL   Hgb urine dipstick NEGATIVE NEGATIVE   Bilirubin Urine NEGATIVE NEGATIVE   Ketones, ur NEGATIVE NEGATIVE mg/dL   Protein, ur 30 (A) NEGATIVE mg/dL   Nitrite NEGATIVE NEGATIVE   Leukocytes,Ua NEGATIVE NEGATIVE   RBC / HPF 0-5 0 - 5 RBC/hpf   WBC, UA 0-5 0 - 5 WBC/hpf   Bacteria, UA RARE (A) NONE SEEN   Mucus PRESENT    Hyaline Casts, UA PRESENT     Comment: Performed at [facility] [address] [place], [place] [address]  Lactic acid, plasma     Status: None   Collection Time: 10/15/18  5:30 PM  Result Value Ref Range   Lactic Acid, Venous 1.5 0.5 - 1.9 mmol/L    Comment: Performed at [facility] [address], [place], [place] [address]  Triglycerides     Status: None   Collection Time: 10/15/18  8:51 PM  Result Value Ref Range   Triglycerides 97 <150 mg/dL    Comment: Performed at [facility], [place] [address] [place], [place] [address]  CBC WITH DIFFERENTIAL     Status: Abnormal   Collection Time: 10/15/18  8:53 PM  Result Value Ref Range   WBC 5.0 4.0 - 10.5 K/uL   RBC 3.54 (L) 4.22 - 5.81 MIL/uL   Hemoglobin 11.3 (L) 13.0 - 17.0 g/dL   HCT 31.0 (L) 39.0 - 52.0 %   MCV 87.6 80.0 - 100.0 fL   MCH 31.9 26.0 - 34.0 pg   MCHC 36.5 (H) 30.0 - 36.0 g/dL   RDW 12.4 11.5 - 15.5 %   Platelets 127 (L) 150 - 400 K/uL    Comment: REPEATED TO VERIFY   nRBC 0.0 0.0 - 0.2 %   Neutrophils Relative % 70 %   Neutro Abs 3.5 1.7 - 7.7 K/uL    Lymphocytes Relative 20 %   Lymphs Abs 1.0 0.7 - 4.0 K/uL   Monocytes Relative 9 %   Monocytes Absolute 0.5 0.1 - 1.0 K/uL   Eosinophils Relative 1 %   Eosinophils Absolute 0.0 0.0 - 0.5 K/uL   Basophils Relative 0 %   Basophils Absolute 0.0 0.0 - 0.1 K/uL   Immature Granulocytes 0 %   Abs Immature Granulocytes 0.02 0.00 - 0.07 K/uL    Comment: Performed at [facility], [place] [address] [place],  [address]  Comprehensive metabolic panel     Status: Abnormal   Collection Time: 10/15/18  8:53 PM  Result Value Ref Range   Sodium 134 (L) 135 - 145 mmol/L   Potassium 4.3 3.5 - 5.1 mmol/L    Comment: SLIGHT HEMOLYSIS   Chloride  103 98 - 111 mmol/L   CO2 21 (L) 22 - 32 mmol/L   Glucose, Bld 141 (H) 70 - 99 mg/dL   BUN 15 8 - 23 mg/dL   Creatinine, Ser 0.85 0.61 - 1.24 mg/dL   Calcium 8.0 (L) 8.9 - 10.3 mg/dL   Total Protein 6.3 (L) 6.5 - 8.1 g/dL   Albumin 2.9 (L) 3.5 - 5.0 g/dL   AST 48 (H) 15 - 41 U/L   ALT 26 0 - 44 U/L   Alkaline Phosphatase 39 38 - 126 U/L   Total Bilirubin 1.2 0.3 - 1.2 mg/dL   GFR calc non Af Amer >60 >60 mL/min   GFR calc Af Amer >60 >60 mL/min   Anion gap 10 5 - 15    Comment: Performed at [facility], [place] [address] [place], [place] [address]  D-dimer, quantitative     Status: Abnormal   Collection Time: 10/15/18  8:53 PM  Result Value Ref Range   D-Dimer, Quant 0.57 (H) 0.00 - 0.50 ug/mL-FEU    Comment: (NOTE) At the manufacturer cut-off of 0.50 ug/mL FEU, this assay has been documented to exclude PE with a sensitivity and negative predictive value of 97 to 99%.  At this time, this assay has not been approved by the FDA to exclude DVT/VTE. Results should be correlated with clinical presentation. Performed at [facility], [place] [address] [place], [place] [address]   Lactate dehydrogenase     Status: Abnormal   Collection Time: 10/15/18  8:53 PM  Result Value Ref Range   LDH 320 (H) 98 - 192 U/L    Comment: Performed at  [facility], [place] [address] [place], [place] [address]  Ferritin     Status: Abnormal   Collection Time: 10/15/18  8:53 PM  Result Value Ref Range   Ferritin 365 (H) 24 - 336 ng/mL    Comment: Performed at [facility], [place] [address], [place], [place] [address]  Fibrinogen     Status: None   Collection Time: 10/15/18  8:53 PM  Result Value Ref Range   Fibrinogen 475 210 - 475 mg/dL    Comment: Performed at [facility] [address] [place], [place] [address]  C-reactive protein     Status: Abnormal   Collection Time: 10/15/18  8:53 PM  Result Value Ref Range   CRP 9.1 (H) <1.0 mg/dL    Comment: Performed at [facility], [place] [address] [place], [place] [address]    Chemistries  Recent Labs  Lab 10/15/18 2053  NA 134*  K 4.3  CL 103  CO2 21*  GLUCOSE 141*  BUN 15  CREATININE 0.85  CALCIUM 8.0*  AST 48*  ALT 26  ALKPHOS 39  BILITOT 1.2   ------------------------------------------------------------------------------------------------------------------  ------------------------------------------------------------------------------------------------------------------ GFR: CrCl cannot be calculated (Unknown ideal weight.). Liver Function Tests: Recent Labs  Lab 10/15/18 2053  AST 48*  ALT 26  ALKPHOS 39  BILITOT 1.2  PROT 6.3*  ALBUMIN 2.9*   No results for input(s): LIPASE, AMYLASE in the last 168 hours. No results for input(s): AMMONIA in the last 168 hours. Coagulation Profile: No results for input(s): INR, PROTIME in the last 168 hours. Cardiac Enzymes: No results for input(s): CKTOTAL, CKMB, CKMBINDEX, TROPONINI in the last 168 hours. BNP (last 3 results) No results for input(s): PROBNP in the last 8760 hours. HbA1C: No results for input(s): HGBA1C in the last 72 hours.  CBG: Recent Labs  Lab 10/15/18 1604  GLUCAP 176*   Lipid Profile: Recent Labs    10/15/18 2051  TRIG 97   Thyroid Function Tests: No results for  input(s): TSH, T4TOTAL, FREET4, T3FREE, THYROIDAB in the last 72 hours. Anemia Panel: Recent Labs    10/15/18 2053  FERRITIN 365*    --------------------------------------------------------------------------------------------------------------- Urine analysis:    Component Value Date/Time   COLORURINE AMBER (A) 10/15/2018 1730   APPEARANCEUR HAZY (A) 10/15/2018 1730   LABSPEC 1.019 10/15/2018 1730   PHURINE 5.0 10/15/2018 1730   GLUCOSEU NEGATIVE 10/15/2018 1730   HGBUR NEGATIVE 10/15/2018 1730   HGBUR small 11/18/2009 0910   BILIRUBINUR NEGATIVE 10/15/2018 1730   BILIRUBINUR neg 10/25/2015 1141   KETONESUR NEGATIVE 10/15/2018 1730   PROTEINUR 30 (A) 10/15/2018 1730   UROBILINOGEN 0.2 10/25/2015 1141   UROBILINOGEN negative 11/18/2009 0910   NITRITE NEGATIVE 10/15/2018 1730   LEUKOCYTESUR NEGATIVE 10/15/2018 1730      Imaging Results:    Dg Chest Portable 1 View  Result Date: 10/15/2018 CLINICAL DATA:  Hypoxia EXAM: PORTABLE CHEST 1 VIEW COMPARISON:  March 22, 2015 FINDINGS: There is patchy airspace opacity in each lower lobe region and right mid lung. There is no frank consolidation. There is atelectatic change in the left base. There is a questionable small left pleural effusion. Heart is mildly enlarged with pulmonary vascularity normal. No adenopathy. There is degenerative change in the thoracic spine. IMPRESSION: Patchy airspace opacity in the right mid lung in each lower lobe, likely multifocal pneumonia. No consolidation in these areas of apparent pneumonia. There is left base atelectasis. Suspect minimal left pleural effusion. Heart mildly enlarged with pulmonary vascularity normal. No evident adenopathy. Electronically Signed   By: Lowella Grip III M.D.   On: 10/15/2018 19:19       Assessment & Plan:    Principal Problem:   COVID-19 virus infection Active Problems:   Uncontrolled type 2 diabetes mellitus without complication, without long-term current use  of insulin (HCC)   Hyperlipidemia LDL goal <70   Essential hypertension   Acute respiratory failure with hypoxia (HCC)   Abnormal liver function   Hyponatremia  Acute respiratory failure w hypoxia secondary to Covid -19 infection , ddx CAP  Covid -19 infection Check esr, cpk , IL-6 Avoid Nsaids Dexamethasone '6mg'$  iv qday  CAP Blood culture x2 Urine strep antigen Urine legionella antigen Rocephin 1gm iv qday , zithromax '500mg'$  iv qday  D dimer +, pleural effusion ? Check CTA chest r/o PE and evaluate ? Pleural effusion Premedicate per radiology protocol due to dye allergy Lovenox per pharmacy consult while awaiting CT  Abnormal liver function Check acute hepatitis panel RUQ ultrasound  Dm2 Hold off on Metformin fsbs ac and qhs, ISS  Hypertension , Hyperlipidemia, CAD Cont Amlodipine '5mg'$  po qday Cont Carvedilol 12.'5mg'$  po bid Cont Lipitor '20mg'$  po qhs Cont Aspirin  DVT Prophylaxis-   Lovenox - SCDs   AM Labs Ordered, also please review Full Orders  Family Communication: Admission, patients condition and plan of care including tests being ordered have been discussed with the patient  who indicate understanding and agree with the plan and Code Status.  Code Status:  FULL CODE per patient, and per wife,  Notified wife that pt will be admitted to [facility], spoke with son at  [phone]  Please call son in the morning with an update. Thanks  Admission status: Inpatient  : Based on patients clinical presentation and evaluation of above clinical data,  I have made determination that patient meets Inpatient criteria at this time.  Pt has acute respiratory failure with hypoxia,  Secondary to Covid-19 and CAP,  Pt will require iv abx, and iv dexamethasone Pt has high risk of clinical deterioration, pt will require > 2 nites stay. Pox most recent 82%.     Time spent in minutes : 70   Jani Gravel M.D on 10/15/2018 at 11:10 PM

## 2018-10-15 NOTE — ED Provider Notes (Signed)
[facility] Provider Note   CSN: [id number] Arrival date & time: 10/15/18  1541     History   Chief Complaint Chief Complaint  Patient presents with  . Weakness    HPI Eddie Mcdaniel is a 78 y.o. male with a past medical history of NIDDM, hypertension, hyperlipidemia, CAD presents to ED for evaluation of 1 week history of generalized fatigue, bitter taste in mouth, decreased appetite, shortness of breath, fever of T-max 102.  States that he believes his fatigue and generalized weakness is from his lack of appetite and p.o. intake.  States that he does not want to eat because of the taste in his mouth.  He has been taking his medications as prescribed.  He denies any sick contacts with similar symptoms, wife has had intermittent fevers but is overall improving.  He has been taking ibuprofen with some improvement in his weakness and improvement in his intermittent fevers.  Denies any known exposures to COVID-19.  He does not use supplemental oxygen at home, denies tobacco use or any chronic lung disease.  He denies any vomiting, abdominal pain, diarrhea, chest pain, cough, leg swelling.     HPI  Past Medical History:  Diagnosis Date  . Contrast media allergy   . Coronary artery disease    a. USA/abnl nuc s/p  LHC 11/18/15 showed diffusely disease LAD s/p DES to LAD, mod-severe ostial diagonal disease with tiny diag jailed by stent - given small size, PCI not performed, mod nonobstructive mRCA disease, EF 55-65%.  Marland Kitchen GERD (gastroesophageal reflux disease)   . Hx of adenomatous polyp of colon 10/06/2015  . Hyperlipidemia   . Hypertension   . Type II diabetes mellitus [facility])     Patient Active Problem List   Diagnosis Date Noted  . Obstructive sleep apnea 03/01/2017  . Preventative health care 01/18/2017  . Snoring 10/08/2016  . Lower extremity edema 06/05/2016  . Acute right-sided low back pain with right-sided sciatica 03/29/2016  .  Coronary artery disease involving native coronary artery of native heart without angina pectoris 11/18/2015  . Contrast media allergy 11/18/2015  . Angina pectoris ([place]) 11/17/2015  . Abnormal stress test 11/17/2015  . Stable angina ([place]) 11/17/2015  . Hx of adenomatous polyp of colon 10/06/2015  . Hip pain, left 07/18/2010  . CONTACT DERMATITIS 07/25/2009  . PNEUMONIA, BILATERAL 04/13/2009  . Nonspecific (abnormal) findings on radiological and other examination of body structure 04/13/2009  . COMPUTERIZED TOMOGRAPHY, CHEST, ABNORMAL 04/13/2009  . ELEVATED PROSTATE SPECIFIC ANTIGEN 03/18/2009  . COUGH 01/27/2009  . URI 06/03/2007  . Uncontrolled type 2 diabetes mellitus without complication, without long-term current use of insulin ([place]) 04/15/2006  . Hyperlipidemia LDL goal <70 04/15/2006  . Essential hypertension 04/15/2006  . GERD 04/15/2006    Past Surgical History:  Procedure Laterality Date  . CARDIAC CATHETERIZATION N/A 11/17/2015   Procedure: Left Heart Cath and Coronary Angiography;  Surgeon: Nelva Bush, MD;  Location: [place] [facility];  Service: Cardiovascular;  Laterality: N/A;  . CARDIAC CATHETERIZATION N/A 11/17/2015   Procedure: Coronary Stent Intervention;  Surgeon: Nelva Bush, MD;  Location: [place] [facility];  Service: Cardiovascular;  Laterality: N/A;  . CATARACT EXTRACTION W/ INTRAOCULAR LENS  IMPLANT, BILATERAL Bilateral 2005  . CORONARY ANGIOPLASTY    . SHOULDER ARTHROSCOPY W/ ROTATOR CUFF REPAIR Right 2000s X 1  . SHOULDER OPEN ROTATOR CUFF REPAIR Right 2000s X 1   "after 1st OR didn't work; had to put tissue  in this time"        Home Medications    Prior to Admission medications   Medication Sig Start Date End Date Taking? Authorizing Provider  amLODipine (NORVASC) 5 MG tablet Take 5 mg by mouth daily.    [provider]  aspirin EC 81 MG tablet Take 1 tablet (81 mg total) by mouth daily. 11/18/15   Dunn, Nedra Hai, PA-C   atorvastatin (LIPITOR) 20 MG tablet TAKE 1 TABLET EVERY EVENING 12/19/16   Roma Schanz R, DO  blood glucose meter kit and supplies KIT One Touch Meter and Strips supplies for BID testing Dx: E11.9 10/25/16   Carollee Herter, Alferd Apa, DO  carvedilol (COREG) 12.5 MG tablet TAKE 1 TABLET TWICE A DAY  WITH A MEAL 12/19/16   Carollee Herter, Yvonne R, DO  CINNAMON PO Take by mouth See admin instructions. Takes "half a spoonful" once a day.    [provider]  GARLIC PO Take 1 capsule by mouth daily.    [provider]  glucose blood (ONETOUCH VERIO) test strip Use as instructed once a day.  Dx: E11.65.  Need ov/lab 11/20/17   Carollee Herter, Alferd Apa, DO  metFORMIN (GLUCOPHAGE-XR) 750 MG 24 hr tablet TAKE 1 TABLET TWICE A DAY 03/21/17   Carollee Herter, [place] R, DO  ONE TOUCH LANCETS MISC UAD BID for Dx: E11.9 10/25/16   Carollee Herter, Alferd Apa, DO  Turmeric POWD Take by mouth See admin instructions. Takes "half a spoonful" once a day.    [provider]    Family History Family History  Problem Relation Age of Onset  . Diabetes Mother   . Hypertension Mother   . Cancer Brother 90       brain, stomach  . Hyperlipidemia Brother   . Heart disease Brother        MI  . Heart disease Brother        MI  . Diabetes Brother   . Stroke Brother   . Hypertension Brother   . Colon cancer Neg Hx   . Esophageal cancer Neg Hx   . Rectal cancer Neg Hx   . Stomach cancer Neg Hx     Social History Social History   Tobacco Use  . Smoking status: Never Smoker  . Smokeless tobacco: Former Systems developer    Types: Chew  Substance Use Topics  . Alcohol use: No  . Drug use: No     Allergies   Ace inhibitors and Contrast media [iodinated diagnostic agents]   Review of Systems Review of Systems  Constitutional: Positive for activity change, appetite change, fatigue and fever. Negative for chills.  HENT: Negative for ear pain, rhinorrhea, sneezing and sore throat.   Eyes: Negative for  photophobia and visual disturbance.  Respiratory: Positive for shortness of breath. Negative for cough, chest tightness and wheezing.   Cardiovascular: Negative for chest pain and palpitations.  Gastrointestinal: Negative for abdominal pain, blood in stool, constipation, diarrhea, nausea and vomiting.  Genitourinary: Negative for dysuria, hematuria and urgency.  Musculoskeletal: Negative for myalgias.  Skin: Negative for rash.  Neurological: Negative for dizziness, weakness and light-headedness.     Physical Exam Updated Vital Signs BP 133/66   Pulse 75   Temp 98.6 F (37 C) (Oral)   Resp (!) 28   SpO2 95%   Physical Exam Vitals signs and nursing note reviewed.  Constitutional:      General: He is not in acute distress.    Appearance:  He is well-developed.     Comments: 2 L of oxygen being delivered by nasal cannula.  HENT:     Head: Normocephalic and atraumatic.     Nose: Nose normal.  Eyes:     General: No scleral icterus.       Left eye: No discharge.     Conjunctiva/sclera: Conjunctivae normal.  Neck:     Musculoskeletal: Normal range of motion and neck supple.  Cardiovascular:     Rate and Rhythm: Normal rate and regular rhythm.     Heart sounds: Normal heart sounds. No murmur. No friction rub. No gallop.   Pulmonary:     Effort: Pulmonary effort is normal. No respiratory distress.     Breath sounds: Normal breath sounds.  Abdominal:     General: Bowel sounds are normal. There is no distension.     Palpations: Abdomen is soft.     Tenderness: There is no abdominal tenderness. There is no guarding.  Musculoskeletal: Normal range of motion.  Skin:    General: Skin is warm and dry.     Findings: No rash.  Neurological:     Mental Status: He is alert.     Motor: No abnormal muscle tone.     Coordination: Coordination normal.      ED Treatments / Results  Labs (all labs ordered are listed, but only abnormal results are displayed) Labs Reviewed  SARS  CORONAVIRUS 2 ([place] [place] [facility]) - Abnormal; Notable for the following components:      Result Value   SARS Coronavirus 2 POSITIVE (*)    All other components within normal limits  URINALYSIS, ROUTINE W REFLEX MICROSCOPIC - Abnormal; Notable for the following components:   Color, Urine AMBER (*)    APPearance HAZY (*)    Protein, ur 30 (*)    Bacteria, UA RARE (*)    All other components within normal limits  CBC WITH DIFFERENTIAL/PLATELET - Abnormal; Notable for the following components:   RBC 3.54 (*)    Hemoglobin 11.3 (*)    HCT 31.0 (*)    MCHC 36.5 (*)    Platelets 127 (*)    All other components within normal limits  COMPREHENSIVE METABOLIC PANEL - Abnormal; Notable for the following components:   Sodium 134 (*)    CO2 21 (*)    Glucose, Bld 141 (*)    Calcium 8.0 (*)    Total Protein 6.3 (*)    Albumin 2.9 (*)    AST 48 (*)    All other components within normal limits  LACTATE DEHYDROGENASE - Abnormal; Notable for the following components:   LDH 320 (*)    All other components within normal limits  CBG MONITORING, ED - Abnormal; Notable for the following components:   Glucose-Capillary 176 (*)    All other components within normal limits  CULTURE, BLOOD (ROUTINE X 2)  CULTURE, BLOOD (ROUTINE X 2)  LACTIC ACID, PLASMA  BASIC METABOLIC PANEL  D-DIMER, QUANTITATIVE (NOT AT [facility])  PROCALCITONIN  FERRITIN  TRIGLYCERIDES  FIBRINOGEN  C-REACTIVE PROTEIN    EKG None  Radiology Dg Chest Portable 1 View  Result Date: 10/15/2018 CLINICAL DATA:  Hypoxia EXAM: PORTABLE CHEST 1 VIEW COMPARISON:  March 22, 2015 FINDINGS: There is patchy airspace opacity in each lower lobe region and right mid lung. There is no frank consolidation. There is atelectatic change in the left base. There is a questionable small left pleural effusion. Heart is mildly enlarged  with pulmonary vascularity normal. No adenopathy. There is degenerative change in  the thoracic spine. IMPRESSION: Patchy airspace opacity in the right mid lung in each lower lobe, likely multifocal pneumonia. No consolidation in these areas of apparent pneumonia. There is left base atelectasis. Suspect minimal left pleural effusion. Heart mildly enlarged with pulmonary vascularity normal. No evident adenopathy. Electronically Signed   By: Lowella Grip III M.D.   On: 10/15/2018 19:19    Procedures .Critical Care Performed by: Delia Heady, PA-C Authorized by: Delia Heady, PA-C   Critical care provider statement:    Critical care time (minutes):  35   Critical care time was exclusive of:  Separately billable procedures and treating other patients   Critical care was necessary to treat or prevent imminent or life-threatening deterioration of the following conditions:  Circulatory failure, respiratory failure and sepsis   Critical care was time spent personally by me on the following activities:  Development of treatment plan with patient or surrogate, discussions with consultants, evaluation of patient's response to treatment, examination of patient, pulse oximetry, re-evaluation of patient's condition, review of old charts, ordering and review of radiographic studies and ordering and review of laboratory studies   I assumed direction of critical care for this patient from another provider in my specialty: no     (including critical care time)  Medications Ordered in ED Medications  sodium chloride flush (NS) 0.9 % injection 3 mL (3 mLs Intravenous Not Given 10/15/18 1658)  sodium chloride 0.9 % bolus 1,000 mL (0 mLs Intravenous Stopped 10/15/18 2025)     Initial Impression / Assessment and Plan / ED Course  I have reviewed the triage vital signs and the nursing notes.  Pertinent labs & imaging results that were available during my care of the patient were reviewed by me and considered in my medical decision making (see chart for details).  Clinical Course as of Oct 14 2237  Wed Oct 15, 2018  1659 Placed on 2 L of oxygen via nasal cannula.  SpO2(!): 85 % [HK]    Clinical Course User Index [HK] Delia Heady, PA-C       KELIJAH TOWRY was evaluated in Emergency Department on 10/15/18  for the symptoms described in the history of present illness. He/she was evaluated in the context of the global COVID-19 pandemic, which necessitated consideration that the patient might be at risk for infection with the SARS-CoV-2 virus that causes COVID-19. Institutional protocols and algorithms that pertain to the evaluation of patients at risk for COVID-19 are in a state of rapid change based on information released by regulatory bodies including the CDC and federal and state organizations. These policies and algorithms were followed during the patient's care in the ED.  78 year old male with past medical history of hypertension presents to ED for 1 week history of fever with T-max 102, decreased appetite, generalized malaise and fatigue.  He states that he feels short of breath even with walking short distances in his home.  Wife has had intermittent fevers but is gradually improving.  No known exposures to COVID-19.  Patient oxygen saturations 85% on room air on arrival, placed on 2 L of oxygen with improvement. No signs of respiratory distress noted.  He denies any chest pain, cough, hemoptysis or leg swelling.  Denies any history of DVT or PE.  Lab work significant for positive COVID-19 test.  Lactic acid level is normal.  CBC, CMP unremarkable. Chest x-ray shows findings consistent with multifocal pneumonia.  Patient's blood pressure improved with fluids. Will admit to hospitalist for COVID-19 infection.   Final Clinical Impressions(s) / ED Diagnoses   Final diagnoses:  COVID-19 virus infection  Hypoxia  Community acquired pneumonia, unspecified laterality    ED Discharge Orders    None       Delia Heady, PA-C 10/15/18 2239    Quintella Reichert, MD  10/15/18 2323

## 2018-10-15 NOTE — ED Notes (Signed)
Called [facility] for transport to [facility] 

## 2018-10-15 NOTE — ED Triage Notes (Signed)
Pt presents with "feeling sick" x10 days, bitter taste in mouth, poor appetite, feeling tired and weakness. Pt's SBP 90's in triage. States he's been taking his BP meds as prescribed

## 2018-10-15 NOTE — ED Notes (Signed)
Pt son returned my call, I gave him all the updates regarding his father.

## 2018-10-16 ENCOUNTER — Inpatient Hospital Stay (HOSPITAL_COMMUNITY): Payer: Medicare HMO

## 2018-10-16 ENCOUNTER — Encounter (HOSPITAL_COMMUNITY): Payer: Self-pay | Admitting: Emergency Medicine

## 2018-10-16 DIAGNOSIS — Z8249 Family history of ischemic heart disease and other diseases of the circulatory system: Secondary | ICD-10-CM | POA: Diagnosis not present

## 2018-10-16 DIAGNOSIS — I2510 Atherosclerotic heart disease of native coronary artery without angina pectoris: Secondary | ICD-10-CM | POA: Diagnosis not present

## 2018-10-16 DIAGNOSIS — E871 Hypo-osmolality and hyponatremia: Secondary | ICD-10-CM | POA: Diagnosis not present

## 2018-10-16 DIAGNOSIS — U071 COVID-19: Secondary | ICD-10-CM | POA: Diagnosis not present

## 2018-10-16 DIAGNOSIS — J90 Pleural effusion, not elsewhere classified: Secondary | ICD-10-CM | POA: Diagnosis not present

## 2018-10-16 DIAGNOSIS — I10 Essential (primary) hypertension: Secondary | ICD-10-CM | POA: Diagnosis not present

## 2018-10-16 DIAGNOSIS — E785 Hyperlipidemia, unspecified: Secondary | ICD-10-CM | POA: Diagnosis not present

## 2018-10-16 DIAGNOSIS — J9601 Acute respiratory failure with hypoxia: Secondary | ICD-10-CM | POA: Diagnosis not present

## 2018-10-16 DIAGNOSIS — J1289 Other viral pneumonia: Secondary | ICD-10-CM | POA: Diagnosis not present

## 2018-10-16 DIAGNOSIS — Z23 Encounter for immunization: Secondary | ICD-10-CM | POA: Diagnosis not present

## 2018-10-16 LAB — CBC WITH DIFFERENTIAL/PLATELET
Abs Immature Granulocytes: 0.03 10*3/uL (ref 0.00–0.07)
Basophils Absolute: 0 10*3/uL (ref 0.0–0.1)
Basophils Relative: 0 %
Eosinophils Absolute: 0 10*3/uL (ref 0.0–0.5)
Eosinophils Relative: 0 %
HCT: 34.6 % — ABNORMAL LOW (ref 39.0–52.0)
Hemoglobin: 12 g/dL — ABNORMAL LOW (ref 13.0–17.0)
Immature Granulocytes: 1 %
Lymphocytes Relative: 9 %
Lymphs Abs: 0.5 10*3/uL — ABNORMAL LOW (ref 0.7–4.0)
MCH: 30.2 pg (ref 26.0–34.0)
MCHC: 34.7 g/dL (ref 30.0–36.0)
MCV: 87.2 fL (ref 80.0–100.0)
Monocytes Absolute: 0.2 10*3/uL (ref 0.1–1.0)
Monocytes Relative: 4 %
Neutro Abs: 4.7 10*3/uL (ref 1.7–7.7)
Neutrophils Relative %: 86 %
Platelets: 159 10*3/uL (ref 150–400)
RBC: 3.97 MIL/uL — ABNORMAL LOW (ref 4.22–5.81)
RDW: 12.3 % (ref 11.5–15.5)
WBC: 5.4 10*3/uL (ref 4.0–10.5)
nRBC: 0 % (ref 0.0–0.2)

## 2018-10-16 LAB — COMPREHENSIVE METABOLIC PANEL
ALT: 26 U/L (ref 0–44)
AST: 37 U/L (ref 15–41)
Albumin: 3.3 g/dL — ABNORMAL LOW (ref 3.5–5.0)
Alkaline Phosphatase: 43 U/L (ref 38–126)
Anion gap: 11 (ref 5–15)
BUN: 10 mg/dL (ref 8–23)
CO2: 24 mmol/L (ref 22–32)
Calcium: 8.3 mg/dL — ABNORMAL LOW (ref 8.9–10.3)
Chloride: 100 mmol/L (ref 98–111)
Creatinine, Ser: 0.79 mg/dL (ref 0.61–1.24)
GFR calc Af Amer: >60 mL/min (ref >60)
GFR calc non Af Amer: >60 mL/min (ref >60)
Glucose, Bld: 219 mg/dL — ABNORMAL HIGH (ref 70–99)
Potassium: 3.6 mmol/L (ref 3.5–5.1)
Sodium: 135 mmol/L (ref 135–145)
Total Bilirubin: 0.6 mg/dL (ref 0.3–1.2)
Total Protein: 7 g/dL (ref 6.5–8.1)

## 2018-10-16 LAB — ABO/RH: ABO/RH(D): A POS

## 2018-10-16 LAB — GLUCOSE, CAPILLARY
Glucose-Capillary: 225 mg/dL — ABNORMAL HIGH (ref 70–99)
Glucose-Capillary: 382 mg/dL — ABNORMAL HIGH (ref 70–99)
Glucose-Capillary: 311 mg/dL — ABNORMAL HIGH (ref 70–99)
Glucose-Capillary: 297 mg/dL — ABNORMAL HIGH (ref 70–99)

## 2018-10-16 LAB — SEDIMENTATION RATE: Sed Rate: 72 mm/hr — ABNORMAL HIGH (ref 0–16)

## 2018-10-16 LAB — CBG MONITORING, ED: Glucose-Capillary: 162 mg/dL — ABNORMAL HIGH (ref 70–99)

## 2018-10-16 LAB — SAMPLE TO BLOOD BANK

## 2018-10-16 LAB — CK TOTAL AND CKMB (NOT AT ARMC)
CK, MB: 3.5 ng/mL (ref 0.5–5.0)
Relative Index: 2.7 — ABNORMAL HIGH (ref 0.0–2.5)
Total CK: 131 U/L (ref 49–397)

## 2018-10-16 MED ORDER — ENOXAPARIN SODIUM 80 MG/0.8ML ~~LOC~~ SOLN
65.0000 mg | SUBCUTANEOUS | Status: AC
  Administered 2018-10-16: 65 mg via SUBCUTANEOUS
  Filled 2018-10-16: qty 0.65

## 2018-10-16 MED ORDER — INSULIN ASPART 100 UNIT/ML ~~LOC~~ SOLN
0.0000 [IU] | Freq: Three times a day (TID) | SUBCUTANEOUS | Status: DC
  Administered 2018-10-16: 15 [IU] via SUBCUTANEOUS
  Administered 2018-10-17: 11 [IU] via SUBCUTANEOUS
  Administered 2018-10-17: 7 [IU] via SUBCUTANEOUS
  Administered 2018-10-17 – 2018-10-18 (×2): 20 [IU] via SUBCUTANEOUS
  Administered 2018-10-18: 4 [IU] via SUBCUTANEOUS
  Administered 2018-10-18: 11 [IU] via SUBCUTANEOUS
  Administered 2018-10-19: 4 [IU] via SUBCUTANEOUS
  Administered 2018-10-19 (×2): 20 [IU] via SUBCUTANEOUS
  Administered 2018-10-20: 15 [IU] via SUBCUTANEOUS
  Administered 2018-10-20: 7 [IU] via SUBCUTANEOUS
  Administered 2018-10-20 – 2018-10-21 (×2): 15 [IU] via SUBCUTANEOUS
  Administered 2018-10-21: 7 [IU] via SUBCUTANEOUS
  Administered 2018-10-21: 20 [IU] via SUBCUTANEOUS
  Administered 2018-10-22: 3 [IU] via SUBCUTANEOUS
  Administered 2018-10-22 – 2018-10-23 (×3): 20 [IU] via SUBCUTANEOUS
  Administered 2018-10-23: 3 [IU] via SUBCUTANEOUS
  Administered 2018-10-23: 20 [IU] via SUBCUTANEOUS
  Administered 2018-10-24: 7 [IU] via SUBCUTANEOUS
  Administered 2018-10-24: 4 [IU] via SUBCUTANEOUS
  Administered 2018-10-24: 11 [IU] via SUBCUTANEOUS
  Administered 2018-10-25: 7 [IU] via SUBCUTANEOUS
  Administered 2018-10-25: 11 [IU] via SUBCUTANEOUS
  Administered 2018-10-26: 7 [IU] via SUBCUTANEOUS
  Administered 2018-10-27: 15 [IU] via SUBCUTANEOUS

## 2018-10-16 MED ORDER — ENOXAPARIN SODIUM 40 MG/0.4ML ~~LOC~~ SOLN
40.0000 mg | SUBCUTANEOUS | Status: DC
  Administered 2018-10-16 – 2018-11-03 (×19): 40 mg via SUBCUTANEOUS
  Filled 2018-10-16 (×19): qty 0.4

## 2018-10-16 MED ORDER — DIPHENHYDRAMINE HCL 50 MG/ML IJ SOLN
50.0000 mg | Freq: Once | INTRAMUSCULAR | Status: AC
  Administered 2018-10-16: 50 mg via INTRAVENOUS
  Filled 2018-10-16: qty 1

## 2018-10-16 MED ORDER — SODIUM CHLORIDE 0.9 % IV SOLN
1.0000 g | Freq: Once | INTRAVENOUS | Status: AC
  Administered 2018-10-16: 03:00:00 1 g via INTRAVENOUS
  Filled 2018-10-16: qty 10

## 2018-10-16 MED ORDER — SODIUM CHLORIDE 0.9 % IV SOLN
200.0000 mg | Freq: Once | INTRAVENOUS | Status: AC
  Administered 2018-10-16: 200 mg via INTRAVENOUS
  Filled 2018-10-16: qty 40

## 2018-10-16 MED ORDER — SODIUM CHLORIDE 0.9 % IV SOLN
500.0000 mg | INTRAVENOUS | Status: DC
  Administered 2018-10-16: 500 mg via INTRAVENOUS
  Filled 2018-10-16: qty 500

## 2018-10-16 MED ORDER — ENOXAPARIN SODIUM 80 MG/0.8ML ~~LOC~~ SOLN: 65.0000 mg | Freq: Two times a day (BID) | SUBCUTANEOUS | Status: DC

## 2018-10-16 MED ORDER — AMLODIPINE BESYLATE 5 MG PO TABS
5.0000 mg | ORAL_TABLET | Freq: Every day | ORAL | Status: DC
  Administered 2018-10-16 – 2018-11-04 (×20): 5 mg via ORAL
  Filled 2018-10-16 (×21): qty 1

## 2018-10-16 MED ORDER — ATORVASTATIN CALCIUM 10 MG PO TABS
20.0000 mg | ORAL_TABLET | Freq: Every evening | ORAL | Status: DC
  Administered 2018-10-16 – 2018-11-03 (×19): 20 mg via ORAL
  Filled 2018-10-16 (×19): qty 2

## 2018-10-16 MED ORDER — ASPIRIN EC 81 MG PO TBEC
81.0000 mg | DELAYED_RELEASE_TABLET | Freq: Every day | ORAL | Status: DC
  Administered 2018-10-16 – 2018-11-04 (×20): 81 mg via ORAL
  Filled 2018-10-16 (×21): qty 1

## 2018-10-16 MED ORDER — CARVEDILOL 12.5 MG PO TABS
12.5000 mg | ORAL_TABLET | Freq: Two times a day (BID) | ORAL | Status: DC
  Administered 2018-10-16 – 2018-11-04 (×39): 12.5 mg via ORAL
  Filled 2018-10-16 (×39): qty 1

## 2018-10-16 MED ORDER — INSULIN ASPART 100 UNIT/ML ~~LOC~~ SOLN
0.0000 [IU] | Freq: Every day | SUBCUTANEOUS | Status: DC
  Administered 2018-10-16 – 2018-10-17 (×2): 3 [IU] via SUBCUTANEOUS
  Administered 2018-10-19: 4 [IU] via SUBCUTANEOUS
  Administered 2018-10-20: 5 [IU] via SUBCUTANEOUS
  Administered 2018-10-21: 4 [IU] via SUBCUTANEOUS
  Administered 2018-10-22: 5 [IU] via SUBCUTANEOUS
  Administered 2018-10-23: 4 [IU] via SUBCUTANEOUS
  Administered 2018-10-24: 2 [IU] via SUBCUTANEOUS

## 2018-10-16 MED ORDER — PREDNISONE 20 MG PO TABS
50.0000 mg | ORAL_TABLET | Freq: Four times a day (QID) | ORAL | Status: DC
  Administered 2018-10-16 (×2): 50 mg via ORAL
  Filled 2018-10-16: qty 1
  Filled 2018-10-16: qty 3

## 2018-10-16 MED ORDER — DIPHENHYDRAMINE HCL 25 MG PO CAPS
50.0000 mg | ORAL_CAPSULE | Freq: Once | ORAL | Status: AC
  Filled 2018-10-16: qty 2

## 2018-10-16 MED ORDER — SODIUM CHLORIDE 0.9 % IV SOLN
100.0000 mg | INTRAVENOUS | Status: DC
  Administered 2018-10-17: 100 mg via INTRAVENOUS
  Filled 2018-10-16: qty 20

## 2018-10-16 MED ORDER — SODIUM CHLORIDE 0.9 % IV SOLN: 1.0000 g | INTRAVENOUS | Status: DC

## 2018-10-16 NOTE — Progress Notes (Signed)
Pharmacy Brief Note   O:  ALT: 26 CXR: Patchy airspace opacity in the right mid lung in each lower lobe, likely multifocal pneumonia   A/P:  Patient meets requirements for remdesivir therapy.  Will start  remdesivir 200 mg IV x 1  followed by 100 mg IV daily x 4 days.  Monitor ALT  Royetta Asal, PharmD, BCPS 10/16/2018 3:07 AM

## 2018-10-16 NOTE — Progress Notes (Signed)
ANTICOAGULATION CONSULT NOTE - Initial Consult  Pharmacy Consult for Lovenox Indication: r/o PE  Allergies  Allergen Reactions  . Ace Inhibitors Anaphylaxis    angioedema  . Contrast Media [Iodinated Diagnostic Agents] Itching    Patient Measurements: Weight: 146 lb (66.2 kg)  Vital Signs: Temp: 98.6 F (37 C) (09/16 1550) Temp Source: Oral (09/16 1550) BP: 122/76 (09/16 2345) Pulse Rate: 84 (09/16 2345)  Labs: Recent Labs    10/15/18 2053  HGB 11.3*  HCT 31.0*  PLT 127*  CREATININE 0.85    CrCl cannot be calculated (Unknown ideal weight.).   Medical History: Past Medical History:  Diagnosis Date  . Contrast media allergy   . Coronary artery disease    a. USA/abnl nuc s/p  LHC 11/18/15 showed diffusely disease LAD s/p DES to LAD, mod-severe ostial diagonal disease with tiny diag jailed by stent - given small size, PCI not performed, mod nonobstructive mRCA disease, EF 55-65%.  Marland Kitchen GERD (gastroesophageal reflux disease)   . Hx of adenomatous polyp of colon 10/06/2015  . Hyperlipidemia   . Hypertension   . Type II diabetes mellitus ([facility])     Medications:  See electronic med rec  Assessment: 78 y.o. M presents with fever, poor intake and energy - found to be COVID positive. To begin full-dose lovenox for r/o PE. Plan to transfer to [place]. Plt 127 on admission.  Goal of Therapy:  Anti-Xa level 0.6-1 units/ml 4hrs after LMWH dose given Monitor platelets by anticoagulation protocol: Yes   Plan:  Lovenox 65mg  SQ q12h CBC q72h while on Lovenox F/u CT results  Sherlon Handing, PharmD, BCPS 10/16/2018,1:32 AM

## 2018-10-16 NOTE — ED Notes (Signed)
CBG 162 

## 2018-10-16 NOTE — Progress Notes (Signed)
Paged by RN regarding pt's increased oxygen requirement this afternoon. On my evaluation the patient reports a coughing spell after drinking cold water where the water "went down the wrong way." At that time he felt very minimally short of breath, but hasn't since. However, due to lower SpO2 has been put on 6L O2. He denies any other complaints, says he feels a bit better than this morning. Usually drinks hot water, and doesn't like cold water. Does not cough w/po intake with any regularity.   Lungs have stable crackles diffusely without new finding. Respirations are even and nonlabored, he is in no distress. SpO2 remained >95% throughout encounter.   I discussed convalescent plasma in depth with the patient who opts to defer this. He feels improved and doesn't think he needs it. In the event that he were to worsen, he would consider it so I've left the consent information with him. He would also opt for intubation were that to become necessary. I took the phone numbers of his son and DIL and will call to update them. We will continue the plan otherwise per the progress note from this morning.  Vance Gather, MD 10/16/2018 6:44 PM

## 2018-10-16 NOTE — Progress Notes (Addendum)
1118: Call and spoke with Wife Eddie Mcdaniel. Updated on patient condition. Questions answered.  1642: Page to MD. Patient sats dropped to 79, O2 increased to 6L, sats up to high 80s, highest 91. Patient encouraged to use IS  1700: Call to kitchen. Patient does not eat meat, eggs, nor fish.

## 2018-10-16 NOTE — Progress Notes (Signed)
PROGRESS NOTE  Eddie Mcdaniel  [name] DOB: October 16, 1940 DOA: 10/15/2018 PCP: Ann Held, DO   Brief Narrative: "Eddie Mcdaniel" Peragine is a 78 y.o. male with a history of CAD s/p DES to LAD, NIDT2DM, HTN, HLD, and GERD who presented to the ED 9/16 with progressive fatigue since 9/7 associated with diffuse weakness, poor per oral intake and intermittent fevers. In the ED he was hypoxic on room air with CXR demonstrating patchy airspace opacities without consolidation and possible left pleural effusion. CRP elevated at 9.1, PCT undetectable.    Assessment & Plan: Principal Problem:   COVID-19 virus infection Active Problems:   Uncontrolled type 2 diabetes mellitus without complication, without long-term current use of insulin (HCC)   Hyperlipidemia LDL goal <70   Essential hypertension   Acute respiratory failure with hypoxia (HCC)   Abnormal liver function   Hyponatremia  Acute hypoxic respiratory failure due to covid-19 pneumonia: High risk of progression to ARDS based on age, comorbidities, severely elevated inflammatory markers and CXR infiltrates.  - Continue remdesivir x5 days (9/17 - 9/21) - Continue steroids x10 days (9/17 - 9/26) - Consider CCP consent if hypoxia worsens - With undetectable PCT and otherwise typical features of covid pneumonia, do not believe antibiotics are currently needed.  - Continue airborne, contact precautions. PPE including surgical gown, gloves, cap, shoe covers, and CAPR used during this encounter in a negative pressure room.  - Check daily labs: CBC w/diff, CMP, d-dimer, ferritin, CRP - Maintain euvolemia - Avoid NSAIDs - Recommend proning and aggressive use of incentive spirometry.  Left pleural effusion:  - Consider repeat CXR. Check BNP, consider diuresis beginning 9/18 (currently volume down slightly) and possible thoracentesis if persistent.   Elevated d-dimer: Appears congruent with level of other inflammatory marker elevations,  and is actually below age-adjusted cut-off.  - With hx allergy to contrast media, will defer CTA chest for now - Change to prophylactic dose lovenox.  - Trend  AST elevation: Mild, consistent with covid-19 infection. RUQ U/S normal, LFTs otherwise reassuring.  - Monitor LFTs - Ok to give statin  NIDT2DM:  - Hold metformin - Sensitive SSI AC/HS started. CBG rising rapidly. Will check A1c in AM and augment to resistant SSI.  CAD s/p DES to LAD: No anginal symptoms. No ischemic features on ECG which I personally reviewed this morning showing borderline sinus bradycardia. - Continue home beta blocker, statin, ASA  HTN:  - Continue coreg and norvasc  HLD:  - Continue statin  DVT prophylaxis: Lovenox Code Status: Full Family Communication: None at bedside Disposition Plan: Uncertain, would be eleigible for DC on 9/21 after 5th dose of remdesivir if improving clinically. PT/OT evaluations ordered due to ongoing weakness.  Consultants:   None  Procedures:   None  Antimicrobials:  Remdesivir 9/17 - 9/21  Ceftriaxone, azithromycin 9/17   Subjective: Feels severely weak diffusely without focal weakness or numbness or speech difficulty. Dyspnea in moderate at rest and not his primary complaint. No chest pain or palpitations. Worried about his wife who was also sick but not as severely as him.  Objective: Vitals:   10/16/18 0438 10/16/18 0645 10/16/18 0729 10/16/18 0737  BP:   121/62   Pulse: 84 83 90 90  Resp: 20 19 (!) 28 (!) 24  Temp:   98.6 F (37 C)   TempSrc:   Oral   SpO2: 91% 96% 93% 90%  Weight:      Height:        Intake/Output  Summary (Last 24 hours) at 10/16/2018 1431 Last data filed at 10/16/2018 1156 Gross per 24 hour  Intake 1598.84 ml  Output 1000 ml  Net 598.84 ml   Filed Weights   10/16/18 0100 10/16/18 0309  Weight: 66.2 kg 63.4 kg    Gen: 78 y.o. male in no distress Pulm: Non-labored breathing supplemental oxygen. Crackles bilaterally,  scant. Decreased at bases. CV: Regular rate and rhythm. No murmur, rub, or gallop. No JVD, no significant pedal edema. GI: Abdomen soft, non-tender, non-distended, with normoactive bowel sounds. No organomegaly or masses felt. Ext: Warm, no deformities Skin: No rashes, lesions or ulcers Neuro: Alert and oriented. No focal neurological deficits. Psych: Judgement and insight appear normal. Mood & affect appropriate.   Data Reviewed: I have personally reviewed following labs and imaging studies  CBC: Recent Labs  Lab 10/15/18 2053 10/16/18 0610  WBC 5.0 5.4  NEUTROABS 3.5 4.7  HGB 11.3* 12.0*  HCT 31.0* 34.6*  MCV 87.6 87.2  PLT 127* [id number]   Basic Metabolic Panel: Recent Labs  Lab 10/15/18 2053 10/16/18 0610  NA 134* 135  K 4.3 3.6  CL 103 100  CO2 21* 24  GLUCOSE 141* 219*  BUN 15 10  CREATININE 0.85 0.79  CALCIUM 8.0* 8.3*   GFR: Estimated Creatinine Clearance: 68.3 mL/min (by C-G formula based on SCr of 0.79 mg/dL). Liver Function Tests: Recent Labs  Lab 10/15/18 2053 10/16/18 0610  AST 48* 37  ALT 26 26  ALKPHOS 39 43  BILITOT 1.2 0.6  PROT 6.3* 7.0  ALBUMIN 2.9* 3.3*   No results for input(s): LIPASE, AMYLASE in the last 168 hours. No results for input(s): AMMONIA in the last 168 hours. Coagulation Profile: No results for input(s): INR, PROTIME in the last 168 hours. Cardiac Enzymes: Recent Labs  Lab 10/16/18 0610  CKTOTAL 131  CKMB 3.5   BNP (last 3 results) No results for input(s): PROBNP in the last 8760 hours. HbA1C: No results for input(s): HGBA1C in the last 72 hours. CBG: Recent Labs  Lab 10/15/18 1604 10/16/18 0736 10/16/18 1139  GLUCAP 176* 225* 382*   Lipid Profile: Recent Labs    10/15/18 2051  TRIG 97   Thyroid Function Tests: No results for input(s): TSH, T4TOTAL, FREET4, T3FREE, THYROIDAB in the last 72 hours. Anemia Panel: Recent Labs    10/15/18 2053  FERRITIN 365*   Urine analysis:    Component Value Date/Time    COLORURINE AMBER (A) 10/15/2018 1730   APPEARANCEUR HAZY (A) 10/15/2018 1730   LABSPEC 1.019 10/15/2018 1730   PHURINE 5.0 10/15/2018 1730   GLUCOSEU NEGATIVE 10/15/2018 1730   HGBUR NEGATIVE 10/15/2018 1730   HGBUR small 11/18/2009 0910   BILIRUBINUR NEGATIVE 10/15/2018 1730   BILIRUBINUR neg 10/25/2015 1141   KETONESUR NEGATIVE 10/15/2018 1730   PROTEINUR 30 (A) 10/15/2018 1730   UROBILINOGEN 0.2 10/25/2015 1141   UROBILINOGEN negative 11/18/2009 0910   NITRITE NEGATIVE 10/15/2018 1730   LEUKOCYTESUR NEGATIVE 10/15/2018 1730   Recent Results (from the past 240 hour(s))  SARS Coronavirus 2 [facility] order, Performed in [facility] hospital lab) Nasopharyngeal Nasopharyngeal Swab     Status: Abnormal   Collection Time: 10/15/18  5:10 PM   Specimen: Nasopharyngeal Swab  Result Value Ref Range Status   SARS Coronavirus 2 POSITIVE (A) NEGATIVE Final    Comment: RESULT CALLED TO, READ BACK BY AND VERIFIED WITH: Marzella Schlein RN 1910 10/15/18 A BROWNING (NOTE) If result is NEGATIVE SARS-CoV-2 target nucleic acids are NOT  DETECTED. The SARS-CoV-2 RNA is generally detectable in upper and lower  respiratory specimens during the acute phase of infection. The lowest  concentration of SARS-CoV-2 viral copies this assay can detect is 250  copies / mL. A negative result does not preclude SARS-CoV-2 infection  and should not be used as the sole basis for treatment or other  patient management decisions.  A negative result may occur with  improper specimen collection / handling, submission of specimen other  than nasopharyngeal swab, presence of viral mutation(s) within the  areas targeted by this assay, and inadequate number of viral copies  (<250 copies / mL). A negative result must be combined with clinical  observations, patient history, and epidemiological information. If result is POSITIVE SARS-CoV-2 target nucleic acids are DETECTED. T he SARS-CoV-2 RNA is generally detectable in upper  and lower  respiratory specimens during the acute phase of infection.  Positive  results are indicative of active infection with SARS-CoV-2.  Clinical  correlation with patient history and other diagnostic information is  necessary to determine patient infection status.  Positive results do  not rule out bacterial infection or co-infection with other viruses. If result is PRESUMPTIVE POSTIVE SARS-CoV-2 nucleic acids MAY BE PRESENT.   A presumptive positive result was obtained on the submitted specimen  and confirmed on repeat testing.  While 2019 novel coronavirus  (SARS-CoV-2) nucleic acids may be present in the submitted sample  additional confirmatory testing may be necessary for epidemiological  and / or clinical management purposes  to differentiate between  SARS-CoV-2 and other Sarbecovirus currently known to infect humans.  If clinically indicated additional testing with an alternate test  methodology [phone]) is  advised. The SARS-CoV-2 RNA is generally  detectable in upper and lower respiratory specimens during the acute  phase of infection. The expected result is Negative. Fact Sheet for Patients:  StrictlyIdeas.no Fact Sheet for Healthcare Providers: BankingDealers.co.za This test is not yet approved or cleared by the [place] FDA and has been authorized for detection and/or diagnosis of SARS-CoV-2 by FDA under an Emergency Use Authorization (EUA).  This EUA will remain in effect (meaning this test can be used) for the duration of the COVID-19 declaration under Section 564(b)(1) of the Act, 21 U.S.C. section 360bbb-3(b)(1), unless the authorization is terminated or revoked sooner. Performed at [facility], [place] [address], [place], [place] [address]   Blood Culture (routine x 2)     Status: None (Preliminary result)   Collection Time: 10/15/18  8:53 PM   Specimen: BLOOD  Result Value Ref Range Status   Specimen  Description BLOOD RIGHT WRIST  Final   Special Requests   Final    BOTTLES DRAWN AEROBIC AND ANAEROBIC Blood Culture adequate volume   Culture   Final    NO GROWTH < 24 HOURS Performed at [facility], [place] [address] [place], [place] [address]    Report Status PENDING  Incomplete  Blood Culture (routine x 2)     Status: None (Preliminary result)   Collection Time: 10/15/18  9:18 PM   Specimen: BLOOD  Result Value Ref Range Status   Specimen Description BLOOD LEFT WRIST  Final   Special Requests   Final    BOTTLES DRAWN AEROBIC AND ANAEROBIC Blood Culture adequate volume   Culture   Final    NO GROWTH < 24 HOURS Performed at [facility], [place] [address], [place], [place] [address]    Report Status PENDING  Incomplete      Radiology Studies: Dg Chest Portable 1 View  Result Date: 10/15/2018 CLINICAL DATA:  Hypoxia EXAM: PORTABLE CHEST 1 VIEW COMPARISON:  March 22, 2015 FINDINGS: There is patchy airspace opacity in each lower lobe region and right mid lung. There is no frank consolidation. There is atelectatic change in the left base. There is a questionable small left pleural effusion. Heart is mildly enlarged with pulmonary vascularity normal. No adenopathy. There is degenerative change in the thoracic spine. IMPRESSION: Patchy airspace opacity in the right mid lung in each lower lobe, likely multifocal pneumonia. No consolidation in these areas of apparent pneumonia. There is left base atelectasis. Suspect minimal left pleural effusion. Heart mildly enlarged with pulmonary vascularity normal. No evident adenopathy. Electronically Signed   By: Lowella Grip III M.D.   On: 10/15/2018 19:19   US Abdomen Limited Ruq  Result Date: 10/16/2018 CLINICAL DATA:  78 year old male with abnormal LFTs. Positive COVID-19. EXAM: ULTRASOUND ABDOMEN LIMITED RIGHT UPPER QUADRANT COMPARISON:  Renal ultrasound 09/23/2013. FINDINGS: Gallbladder: No gallstones or wall thickening visualized.  No sonographic Murphy sign noted by sonographer. No pericholecystic fluid. Common bile duct: Diameter: 4 millimeters, normal. Liver: No focal lesion identified. Within normal limits in parenchymal echogenicity. Portal vein is patent on color Doppler imaging with normal direction of blood flow towards the liver. Other: Negative visible right kidney.  No free fluid. IMPRESSION: Negative right upper quadrant ultrasound. Electronically Signed   By: Genevie Ann M.D.   On: 10/16/2018 02:14    Scheduled Meds: . amLODipine  5 mg Oral Daily  . aspirin EC  81 mg Oral Daily  . atorvastatin  20 mg Oral QPM  . carvedilol  12.5 mg Oral BID WC  . dexamethasone (DECADRON) injection  6 mg Intravenous Q24H  . [START ON 10/17/2018] enoxaparin (LOVENOX) injection  40 mg Subcutaneous Q24H  . insulin aspart  0-5 Units Subcutaneous QHS  . insulin aspart  0-9 Units Subcutaneous TID WC  . sodium chloride flush  3 mL Intravenous Once   Continuous Infusions: . [START ON 10/17/2018] remdesivir 100 mg in NS 250 mL       LOS: 1 day   Time spent: 35 minutes.  Patrecia Pour, MD [facility] [email or web address] Password [facility] 10/16/2018, 2:31 PM

## 2018-10-16 NOTE — Plan of Care (Signed)
  Problem: Education: Goal: Knowledge of risk factors and measures for prevention of condition will improve Outcome: Progressing   Problem: Coping: Goal: Psychosocial and spiritual needs will be supported Outcome: Progressing   Problem: Respiratory: Goal: Will maintain a patent airway Outcome: Progressing Goal: Complications related to the disease process, condition or treatment will be avoided or minimized Outcome: Progressing   

## 2018-10-16 NOTE — Plan of Care (Signed)
  Problem: Clinical Measurements: Goal: Respiratory complications will improve Outcome: Not Progressing  Increased O2 demand. O2 monitored.   Problem: Activity: Goal: Risk for activity intolerance will decrease Outcome: Not Progressing C/O weakness. Patient encouraged to get OOB.    Problem: Nutrition: Goal: Adequate nutrition will be maintained Outcome: Not Progressing  Decreased appetite. PO encouraged.

## 2018-10-17 ENCOUNTER — Inpatient Hospital Stay (HOSPITAL_COMMUNITY): Payer: Medicare HMO

## 2018-10-17 LAB — GLUCOSE, CAPILLARY
Glucose-Capillary: 274 mg/dL — ABNORMAL HIGH (ref 70–99)
Glucose-Capillary: 455 mg/dL — ABNORMAL HIGH (ref 70–99)
Glucose-Capillary: 303 mg/dL — ABNORMAL HIGH (ref 70–99)
Glucose-Capillary: 286 mg/dL — ABNORMAL HIGH (ref 70–99)
Glucose-Capillary: 261 mg/dL — ABNORMAL HIGH (ref 70–99)

## 2018-10-17 LAB — CBC WITH DIFFERENTIAL/PLATELET
Abs Immature Granulocytes: 0.1 10*3/uL — ABNORMAL HIGH (ref 0.00–0.07)
Basophils Absolute: 0 10*3/uL (ref 0.0–0.1)
Basophils Relative: 0 %
Eosinophils Absolute: 0 10*3/uL (ref 0.0–0.5)
Eosinophils Relative: 0 %
HCT: 32.6 % — ABNORMAL LOW (ref 39.0–52.0)
Hemoglobin: 11.3 g/dL — ABNORMAL LOW (ref 13.0–17.0)
Immature Granulocytes: 1 %
Lymphocytes Relative: 6 %
Lymphs Abs: 0.8 10*3/uL (ref 0.7–4.0)
MCH: 30.3 pg (ref 26.0–34.0)
MCHC: 34.7 g/dL (ref 30.0–36.0)
MCV: 87.4 fL (ref 80.0–100.0)
Monocytes Absolute: 0.3 10*3/uL (ref 0.1–1.0)
Monocytes Relative: 3 %
Neutro Abs: 12.4 10*3/uL — ABNORMAL HIGH (ref 1.7–7.7)
Neutrophils Relative %: 90 %
Platelets: 210 10*3/uL (ref 150–400)
RBC: 3.73 MIL/uL — ABNORMAL LOW (ref 4.22–5.81)
RDW: 12.4 % (ref 11.5–15.5)
WBC: 13.7 10*3/uL — ABNORMAL HIGH (ref 4.0–10.5)
nRBC: 0 % (ref 0.0–0.2)

## 2018-10-17 LAB — FERRITIN: Ferritin: 353 ng/mL — ABNORMAL HIGH (ref 24–336)

## 2018-10-17 LAB — COMPREHENSIVE METABOLIC PANEL
ALT: 31 U/L (ref 0–44)
AST: 41 U/L (ref 15–41)
Albumin: 3 g/dL — ABNORMAL LOW (ref 3.5–5.0)
Alkaline Phosphatase: 46 U/L (ref 38–126)
Anion gap: 11 (ref 5–15)
BUN: 21 mg/dL (ref 8–23)
CO2: 21 mmol/L — ABNORMAL LOW (ref 22–32)
Calcium: 8.2 mg/dL — ABNORMAL LOW (ref 8.9–10.3)
Chloride: 102 mmol/L (ref 98–111)
Creatinine, Ser: 0.81 mg/dL (ref 0.61–1.24)
GFR calc Af Amer: >60 mL/min (ref >60)
GFR calc non Af Amer: >60 mL/min (ref >60)
Glucose, Bld: 363 mg/dL — ABNORMAL HIGH (ref 70–99)
Potassium: 3.6 mmol/L (ref 3.5–5.1)
Sodium: 134 mmol/L — ABNORMAL LOW (ref 135–145)
Total Bilirubin: 0.6 mg/dL (ref 0.3–1.2)
Total Protein: 6.3 g/dL — ABNORMAL LOW (ref 6.5–8.1)

## 2018-10-17 LAB — BRAIN NATRIURETIC PEPTIDE: B Natriuretic Peptide: 111 pg/mL — ABNORMAL HIGH (ref 0.0–100.0)

## 2018-10-17 LAB — INTERLEUKIN-6, PLASMA: Interleukin-6, Plasma: 41.6 pg/mL — ABNORMAL HIGH (ref 0.0–12.2)

## 2018-10-17 LAB — HEPATITIS PANEL, ACUTE
HCV Ab: <0.1 s/co ratio (ref 0.0–0.9)
Hep A IgM: NEGATIVE
Hep B C IgM: NEGATIVE
Hepatitis B Surface Ag: NEGATIVE

## 2018-10-17 LAB — D-DIMER, QUANTITATIVE: D-Dimer, Quant: 0.46 ug/mL-FEU (ref 0.00–0.50)

## 2018-10-17 LAB — C-REACTIVE PROTEIN: CRP: 8.6 mg/dL — ABNORMAL HIGH (ref <1.0)

## 2018-10-17 MED ORDER — INSULIN GLARGINE 100 UNIT/ML ~~LOC~~ SOLN
10.0000 [IU] | Freq: Every day | SUBCUTANEOUS | Status: DC
  Administered 2018-10-17: 10 [IU] via SUBCUTANEOUS
  Filled 2018-10-17: qty 0.1

## 2018-10-17 MED ORDER — INSULIN GLARGINE 100 UNIT/ML ~~LOC~~ SOLN
10.0000 [IU] | Freq: Two times a day (BID) | SUBCUTANEOUS | Status: DC
  Administered 2018-10-17 – 2018-10-19 (×5): 10 [IU] via SUBCUTANEOUS
  Filled 2018-10-17 (×6): qty 0.1

## 2018-10-17 MED ORDER — SODIUM CHLORIDE 0.9 % IV SOLN
100.0000 mg | INTRAVENOUS | Status: AC
  Administered 2018-10-18 – 2018-10-20 (×3): 100 mg via INTRAVENOUS
  Filled 2018-10-17 (×3): qty 20

## 2018-10-17 MED ORDER — INFLUENZA VAC A&B SA ADJ QUAD 0.5 ML IM PRSY
0.5000 mL | PREFILLED_SYRINGE | INTRAMUSCULAR | Status: DC
  Filled 2018-10-17: qty 0.5

## 2018-10-17 NOTE — Evaluation (Signed)
Occupational Therapy Evaluation Patient Details Name: Eddie Mcdaniel MRN: [id number] DOB: 31-Jan-1940 Today's Date: 10/17/2018    History of Present Illness 78 y.o. male,  w hypertension, hyperlipidemia, Dm2, CAD, rt rotator cuff repair x2 apparently presents with c/o feeling sick, fever last week, and has had poor po intake and poor energy,  and odd bitter taste in his mouth. Pt denies any travel or covid exposure. Pt admitted 10/15/18 for acute respiratory failure with hypoxia secondary to Covid -19, CAP; +lt pleural effusion   Clinical Impression   This 78 y/o male presents with the above. PTA pt reports very independent with ADL, iADL and functional mobility; reports he is typically very active. Pt performing room level mobility without AD, overall with minA; currently requires minA for LB ADL and setup assist for seated UB ADL. Pt easily fatigued with activity and requires rest breaks throughout. Pt initially on 10L HFNC start of session with O2 sats maintaining>90% with room level mobility. Once seated in recliner able to decrease O2 to 6L HFNC and sats maintaining >90%. He will benefit from continued acute OT services and recommend follow up [facility] services (pending progress, may progress to no followup) to further progress his endurance, safety and independence with ADL/mobility. Will follow.     Follow Up Recommendations  Home health OT;Supervision/Assistance - 24 hour    Equipment Recommendations  Tub/shower seat           Precautions / Restrictions Precautions Precautions: Other (comment) Restrictions Weight Bearing Restrictions: No      Mobility Bed Mobility Overal bed mobility: Needs Assistance Bed Mobility: Supine to Sit     Supine to sit: Min assist     General bed mobility comments: HHA to elevate trunk  Transfers Overall transfer level: Needs assistance   Transfers: Sit to/from Stand Sit to Stand: Supervision         General transfer comment: for  safety with lines/tubes    Balance Overall balance assessment: Mild deficits observed, not formally tested                                         ADL either performed or assessed with clinical judgement   ADL Overall ADL's : Needs assistance/impaired Eating/Feeding: Modified independent;Sitting   Grooming: Set up;Sitting   Upper Body Bathing: Min guard;Sitting   Lower Body Bathing: Minimal assistance;Sit to/from stand   Upper Body Dressing : Set up;Min guard;Sitting   Lower Body Dressing: Minimal assistance;Sit to/from stand   Toilet Transfer: Minimal assistance;+2 for safety/equipment;Ambulation Toilet Transfer Details (indicate cue type and reason): simulated via transfer to recliner, room level mobility Toileting- Clothing Manipulation and Hygiene: Minimal assistance;Sit to/from stand       Functional mobility during ADLs: Minimal assistance General ADL Comments: pt fatigues easily     Vision         Perception     Praxis      Pertinent Vitals/Pain Pain Assessment: No/denies pain     Hand Dominance Right   Extremity/Trunk Assessment Upper Extremity Assessment Upper Extremity Assessment: Generalized weakness;RUE deficits/detail RUE Deficits / Details: pt reports baseline rotator cuff injuries, difficult to move shoulder through full ROM RUE Coordination: decreased gross motor   Lower Extremity Assessment Lower Extremity Assessment: Defer to PT evaluation       Communication Communication Communication: HOH(? or due to loud negative pressure fan in room)   Cognition Arousal/Alertness:  Awake/alert Behavior During Therapy: WFL for tasks assessed/performed Overall Cognitive Status: Within Functional Limits for tasks assessed                                 General Comments: delayed processing, requires repetition at times but suspect this is more likely due difficulty hearing   General Comments       Exercises  Exercises: General Lower Extremity;Other exercises;General Upper Extremity General Exercises - Upper Extremity Shoulder Flexion: AROM;Both;Seated;5 reps;Theraband Theraband Level (Shoulder Flexion): Level 2 (Red) Shoulder Horizontal ABduction: AROM;Both;5 reps;Seated;Theraband Theraband Level (Shoulder Horizontal Abduction): Level 2 (Red) Shoulder Horizontal ADduction: AROM;Both;5 reps;Seated;Theraband Theraband Level (Shoulder Horizontal Adduction): Level 2 (Red) Elbow Flexion: AROM;Both;Seated;Theraband(only able to perform x3 reps due to fatigue) Theraband Level (Elbow Flexion): Level 2 (Red) Elbow Extension: AROM;Both;5 reps;Theraband Theraband Level (Elbow Extension): Level 2 (Red)(fatigued only able to perform x3 reps) Other Exercises Other Exercises: proper use of IS and freq 10x per hour; pt only able to pull 250-700 ml   Shoulder Instructions      Home Living Family/patient expects to be discharged to:: Private residence Living Arrangements: Spouse/significant other Available Help at Discharge: Family;Available 24 hours/day Type of Home: House Home Access: Stairs to enter [employer] of Steps: 5 Entrance Stairs-Rails: None Home Layout: One level     Bathroom Shower/Tub: Occupational psychologist: Standard     Home Equipment: None          Prior Functioning/Environment Level of Independence: Independent        Comments: exercising 7x/wk (walking); retired; drives        OT Problem List: Decreased range of motion;Decreased strength;Decreased activity tolerance;Impaired balance (sitting and/or standing);Cardiopulmonary status limiting activity;Decreased knowledge of precautions      OT Treatment/Interventions: Self-care/ADL training;Therapeutic exercise;Neuromuscular education;DME and/or AE instruction;Energy conservation;Therapeutic activities;Patient/family education;Balance training    OT Goals(Current goals can be found in the care plan  section) Acute Rehab OT Goals Patient Stated Goal: get better and return home OT Goal Formulation: With patient Time For Goal Achievement: 10/31/18 Potential to Achieve Goals: Good  OT Frequency: Min 2X/week   Barriers to D/C:            Co-evaluation PT/OT/SLP Co-Evaluation/Treatment: Yes Reason for Co-Treatment: Complexity of the patient's impairments (multi-system involvement);To address functional/ADL transfers   OT goals addressed during session: ADL's and self-care      AM-PAC OT "6 Clicks" Daily Activity     Outcome Measure Help from another person eating meals?: None Help from another person taking care of personal grooming?: None Help from another person toileting, which includes using toliet, bedpan, or urinal?: A Little Help from another person bathing (including washing, rinsing, drying)?: A Little Help from another person to put on and taking off regular upper body clothing?: None Help from another person to put on and taking off regular lower body clothing?: A Little 6 Click Score: 21   End of Session Equipment Utilized During Treatment: Oxygen Nurse Communication: Mobility status  Activity Tolerance: Patient tolerated treatment well Patient left: in chair;with call bell/phone within reach  OT Visit Diagnosis: Muscle weakness (generalized) (M62.81);Other (comment)(decreased activity tolerance)                Time: [id number] OT Time Calculation (min): 35 min Charges:  OT General Charges $OT Visit: 1 Visit OT Evaluation $OT Eval Moderate Complexity: [place], OT [employer] Pager [phone] Office  [phone]   Raymondo Band 10/17/2018, 2:14 PM

## 2018-10-17 NOTE — Evaluation (Signed)
Physical Therapy Evaluation Patient Details Name: Eddie Mcdaniel MRN: [id number] DOB: December 11, 1940 Today's Date: 10/17/2018   History of Present Illness  78 y.o. male,  w hypertension, hyperlipidemia, Dm2, CAD, rt rotator cuff repair x2 apparently presents with c/o feeling sick, fever last week, and has had poor po intake and poor energy,  and odd bitter taste in his mouth. Pt denies any travel or covid exposure. Pt admitted 10/15/18 for acute respiratory failure with hypoxia secondary to Covid -19, CAP; +lt pleural effusion  Clinical Impression   Pt admitted with above diagnosis. Patient started on 10L HFNC on entering room with sats 97%; during activity at lowest 92% (RR upper 30s, but not in distress); and then at rest returned to 97%. Gradually decreased O2 to 6L while pt seated and saturating 92%. Overall limited ambulation distance due to lack of available portable monitor, however pt slightly unsteady in his room (?him trying to avoid his monitor lines vs true imbalance). Patiently normally very active with no balance issues. Pt currently with functional limitations due to the deficits listed below (see PT Problem List). Pt will benefit from skilled PT to increase their independence and safety with mobility to allow discharge to the venue listed below.       Follow Up Recommendations No PT follow up    Equipment Recommendations  None recommended by PT    Recommendations for Other Services       Precautions / Restrictions Precautions Precautions: Other (comment) Precaution Comments: monitor CP status      Mobility  Bed Mobility               General bed mobility comments: up to EOB with OT  Transfers Overall transfer level: Needs assistance   Transfers: Sit to/from Stand Sit to Stand: Supervision         General transfer comment: for safety with lines/tubes  Ambulation/Gait Ambulation/Gait assistance: Min guard Gait Distance (Feet): 12 Feet Assistive device:  None Gait Pattern/deviations: Step-through pattern;Decreased stride length Gait velocity: slow   General Gait Details: limited by lack of portable monitor  Stairs            Wheelchair Mobility    Modified Rankin (Stroke Patients Only)       Balance Overall balance assessment: Mild deficits observed, not formally tested                                           Pertinent Vitals/Pain Pain Assessment: No/denies pain    Home Living Family/patient expects to be discharged to:: Private residence Living Arrangements: Spouse/significant other Available Help at Discharge: Family;Available 24 hours/day Type of Home: House Home Access: Stairs to enter Entrance Stairs-Rails: None Entrance Stairs-Number of Steps: 5 Home Layout: One level Home Equipment: None      Prior Function Level of Independence: Independent         Comments: exercising 7x/wk (walking); retired; Manufacturing engineer Dominance   Dominant Hand: Right    Extremity/Trunk Assessment   Upper Extremity Assessment Upper Extremity Assessment: Defer to OT evaluation    Lower Extremity Assessment Lower Extremity Assessment: Overall WFL for tasks assessed    Cervical / Trunk Assessment Cervical / Trunk Assessment: Other exceptions Cervical / Trunk Exceptions: obese  Communication   Communication: HOH(? or due to loud negative pressure fan in room)  Cognition Arousal/Alertness: Awake/alert Behavior During Therapy:  WFL for tasks assessed/performed Overall Cognitive Status: Within Functional Limits for tasks assessed                                        General Comments      Exercises General Exercises - Lower Extremity Ankle Circles/Pumps: Both;10 reps(pt verbalized he does 50x per day normally) Long Arc Quad: AROM;Both Heel Slides: Other (comment)(pt verbalized he does 50x per day normally) Hip ABduction/ADduction: Other (comment)(pt verbalized he does 50x  per day normally) Straight Leg Raises: Other (comment)(pt verbalized he does 50x per day normally) Other Exercises Other Exercises: proper use of IS and freq 10x per hour; pt only able to pull 250-700 ml   Assessment/Plan    PT Assessment Patient needs continued PT services  PT Problem List Decreased activity tolerance;Decreased balance;Decreased mobility;Decreased knowledge of use of DME;Decreased knowledge of precautions;Cardiopulmonary status limiting activity       PT Treatment Interventions DME instruction;Gait training;Functional mobility training;Stair training;Therapeutic activities;Therapeutic exercise;Balance training;Patient/family education    PT Goals (Current goals can be found in the Care Plan section)  Acute Rehab PT Goals Patient Stated Goal: get better and return home PT Goal Formulation: With patient Time For Goal Achievement: 10/31/18 Potential to Achieve Goals: Good    Frequency Min 3X/week   Barriers to discharge        Co-evaluation PT/OT/SLP Co-Evaluation/Treatment: Yes Reason for Co-Treatment: Complexity of the patient's impairments (multi-system involvement);To address functional/ADL transfers PT goals addressed during session: Mobility/safety with mobility         AM-PAC PT "6 Clicks" Mobility  Outcome Measure Help needed turning from your back to your side while in a flat bed without using bedrails?: None Help needed moving from lying on your back to sitting on the side of a flat bed without using bedrails?: A Little Help needed moving to and from a bed to a chair (including a wheelchair)?: A Little Help needed standing up from a chair using your arms (e.g., wheelchair or bedside chair)?: A Little Help needed to walk in hospital room?: A Little Help needed climbing 3-5 steps with a railing? : A Little 6 Click Score: 19    End of Session Equipment Utilized During Treatment: Oxygen Activity Tolerance: Patient tolerated treatment well Patient  left: in chair;with call bell/phone within reach Nurse Communication: Mobility status;Other (comment)(turned O2 ) PT Visit Diagnosis: Unsteadiness on feet (R26.81);Difficulty in walking, not elsewhere classified (R26.2)    Time: [id number] PT Time Calculation (min) (ACUTE ONLY): 36 min   Charges:   PT Evaluation $PT Eval Low Complexity: 1 Low            Barry Brunner, PT      Rexanne Mano 10/17/2018, 12:14 PM

## 2018-10-17 NOTE — Progress Notes (Signed)
Pt refused lab draws this am.  Pt stated, " I don't want that many tests done you just dud them yesterday."  Pt reassured that testing is needed for plan of care and measurement of progress. Pt still refused labs this am.

## 2018-10-17 NOTE — Progress Notes (Signed)
PROGRESS NOTE  Eddie Mcdaniel  [name] DOB: 1940-06-19 DOA: 10/15/2018 PCP: Ann Held, DO   Brief Narrative: "Eddie Alfred" Mcdaniel is a 78 y.o. male with a history of CAD s/p DES to LAD, NIDT2DM, HTN, HLD, and GERD who presented to the ED 9/16 with progressive fatigue since 9/7 associated with diffuse weakness, poor per oral intake and intermittent fevers. In the ED he was hypoxic on room air with CXR demonstrating patchy airspace opacities without consolidation and possible left pleural effusion. CRP elevated at 9.1, PCT undetectable. Hypoxia progressed on 9/17 though CXR is stable and he is in no distress. Has declined convalescent plasma.   Assessment & Plan: Principal Problem:   COVID-19 virus infection Active Problems:   Uncontrolled type 2 diabetes mellitus without complication, without long-term current use of insulin (HCC)   Hyperlipidemia LDL goal <70   Essential hypertension   Acute respiratory failure with hypoxia (HCC)   Abnormal liver function   Hyponatremia  Acute hypoxic respiratory failure due to covid-19 pneumonia: High risk of progression to ARDS based on age, comorbidities, severely elevated inflammatory markers and CXR infiltrates.  - Continue remdesivir x5 days (9/17 - 9/21) - Continue steroids x10 days (9/17 - 9/26) - See note from yesterday evening. I spoke with the patient at length and his son about the next steps in management were he to decompensate. They confirm full code, ok w/trial of intubation, but do not want to pursue plasma at this time. The patient again confirms he does not want plasma today. - With undetectable PCT, no development of infiltrate on my personal review of CXR this AM, and otherwise typical features of covid pneumonia, do not believe antibiotics are currently needed.  - Continue airborne, contact precautions. PPE including surgical gown, gloves, cap, shoe covers, and CAPR used during this encounter in a negative pressure room.    - Check daily labs: CBC w/diff, CMP, d-dimer, ferritin, CRP. Recheck this morning (pt refused prior lab draw). If symptoms worsening are felt to represent advancing covid, would opt for CCP.  - Maintain euvolemia. Check BNP. Consider lasix. - Avoid NSAIDs - Recommend proning and aggressive use of incentive spirometry.  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  - Will plan to check CT chest w/contrast per radiology recommendations, though the patient opts to defer this for the time being. Contrast allergy was after a LHC w/itching, so benefit > risk but will need to premedicate.   Left pleural effusion:  - Consider repeat CXR. Check BNP, consider diuresis beginning 9/18 (currently volume down slightly) and possible thoracentesis if persistent.   Elevated d-dimer: Appears congruent with level of other inflammatory marker elevations, has normalized and is actually below age-adjusted cut-off.  - Continue prophylactic dose lovenox.  - Pt wishes to minimize lab draws, so will not trend.  AST elevation: Mild, consistent with covid-19 infection. RUQ U/S normal, LFTs otherwise reassuring. hepatitis panel negative. - Monitor LFTs, pending this AM. - Ok to give statin  NIDT2DM: with steroid-induced hyperglycemia. - Hold metformin - Continue resistant SSI, add 10u lantus this AM. - HbA1c pending.  CAD s/p DES to LAD: No anginal symptoms. No ischemic features on ECG. - Continue home beta blocker, statin, ASA  HTN:  - Continue coreg and norvasc  HLD:  - Continue statin  DVT prophylaxis: Lovenox Code Status: Full Family Communication: None at bedside, spoke with son for 20 minutes last night, will call again today. Disposition Plan: Uncertain, would be  eleigible for DC on 9/21 after 5th dose of remdesivir if improving clinically. PT/OT evaluations ordered due to ongoing weakness.  Consultants:   None  Procedures:   None  Antimicrobials:  Remdesivir 9/17  - 9/21  Ceftriaxone, azithromycin 9/17   Subjective: Remains fatigued but denies any shortness of breath. No chest pain or leg swelling. He generally feels better today than yesterday. Very irritated about lab draws, says he needs to keep all his blood to help recoup strength. Would like to delay CT chest for now, though possibility of cancer was shared with him today.   Objective: Vitals:   10/16/18 1701 10/16/18 1917 10/17/18 0401 10/17/18 0735  BP:  (!) 98/56 114/63 128/65  Pulse:  66 74 83  Resp:  20 20 (!) 22  Temp:  98.4 F (36.9 C) 98.6 F (37 C) 98.7 F (37.1 C)  TempSrc:  Oral Oral Oral  SpO2: 97% 95% 98% 97%  Weight:      Height:        Intake/Output Summary (Last 24 hours) at 10/17/2018 0908 Last data filed at 10/16/2018 2339 Gross per 24 hour  Intake 240 ml  Output 1550 ml  Net -1310 ml   Filed Weights   10/16/18 0100 10/16/18 0309  Weight: 66.2 kg 63.4 kg   Gen: 78 y.o. male in no distress Pulm: Nonlabored breathing supplemental oxygen with normal rate. Reduced crackles bilaterally without wheezing or rhonchi. CV: Regular rate and rhythm. No murmur, rub, or gallop. No JVD, no dependent edema. GI: Abdomen soft, non-tender, non-distended, with normoactive bowel sounds.  Ext: Warm, no deformities Skin: Left forearm hyperpigmentation following bug bite ~2 weeks ago. No tenderness, fluctuance, or ulcer. No rashes, lesions or ulcers on visualized skin. Neuro: Alert and oriented. No focal neurological deficits. Psych: Judgement and insight appear fair. Mood euthymic & affect congruent. Behavior is appropriate.    Data Reviewed: I have personally reviewed following labs and imaging studies  CBC: Recent Labs  Lab 10/15/18 2053 10/16/18 0610  WBC 5.0 5.4  NEUTROABS 3.5 4.7  HGB 11.3* 12.0*  HCT 31.0* 34.6*  MCV 87.6 87.2  PLT 127* [id number]   Basic Metabolic Panel: Recent Labs  Lab 10/15/18 2053 10/16/18 0610  NA 134* 135  K 4.3 3.6  CL 103 100  CO2 21* 24   GLUCOSE 141* 219*  BUN 15 10  CREATININE 0.85 0.79  CALCIUM 8.0* 8.3*   GFR: Estimated Creatinine Clearance: 68.3 mL/min (by C-G formula based on SCr of 0.79 mg/dL). Liver Function Tests: Recent Labs  Lab 10/15/18 2053 10/16/18 0610  AST 48* 37  ALT 26 26  ALKPHOS 39 43  BILITOT 1.2 0.6  PROT 6.3* 7.0  ALBUMIN 2.9* 3.3*   No results for input(s): LIPASE, AMYLASE in the last 168 hours. No results for input(s): AMMONIA in the last 168 hours. Coagulation Profile: No results for input(s): INR, PROTIME in the last 168 hours. Cardiac Enzymes: Recent Labs  Lab 10/16/18 0610  CKTOTAL 131  CKMB 3.5   BNP (last 3 results) No results for input(s): PROBNP in the last 8760 hours. HbA1C: No results for input(s): HGBA1C in the last 72 hours. CBG: Recent Labs  Lab 10/16/18 0736 10/16/18 1139 10/16/18 1615 10/16/18 2108 10/17/18 0802  GLUCAP 225* 382* 311* 297* 274*   Lipid Profile: Recent Labs    10/15/18 2051  TRIG 97   Thyroid Function Tests: No results for input(s): TSH, T4TOTAL, FREET4, T3FREE, THYROIDAB in the last 72 hours. Anemia Panel:  Recent Labs    10/15/18 2053  FERRITIN 365*   Urine analysis:    Component Value Date/Time   COLORURINE AMBER (A) 10/15/2018 1730   APPEARANCEUR HAZY (A) 10/15/2018 1730   LABSPEC 1.019 10/15/2018 1730   PHURINE 5.0 10/15/2018 1730   GLUCOSEU NEGATIVE 10/15/2018 1730   HGBUR NEGATIVE 10/15/2018 1730   HGBUR small 11/18/2009 0910   BILIRUBINUR NEGATIVE 10/15/2018 1730   BILIRUBINUR neg 10/25/2015 1141   KETONESUR NEGATIVE 10/15/2018 1730   PROTEINUR 30 (A) 10/15/2018 1730   UROBILINOGEN 0.2 10/25/2015 1141   UROBILINOGEN negative 11/18/2009 0910   NITRITE NEGATIVE 10/15/2018 1730   LEUKOCYTESUR NEGATIVE 10/15/2018 1730   Recent Results (from the past 240 hour(s))  SARS Coronavirus 2 [facility] order, Performed in [facility] hospital lab) Nasopharyngeal Nasopharyngeal Swab     Status: Abnormal   Collection Time:  10/15/18  5:10 PM   Specimen: Nasopharyngeal Swab  Result Value Ref Range Status   SARS Coronavirus 2 POSITIVE (A) NEGATIVE Final    Comment: RESULT CALLED TO, READ BACK BY AND VERIFIED WITH: Marzella Schlein RN 1910 10/15/18 A BROWNING (NOTE) If result is NEGATIVE SARS-CoV-2 target nucleic acids are NOT DETECTED. The SARS-CoV-2 RNA is generally detectable in upper and lower  respiratory specimens during the acute phase of infection. The lowest  concentration of SARS-CoV-2 viral copies this assay can detect is 250  copies / mL. A negative result does not preclude SARS-CoV-2 infection  and should not be used as the sole basis for treatment or other  patient management decisions.  A negative result may occur with  improper specimen collection / handling, submission of specimen other  than nasopharyngeal swab, presence of viral mutation(s) within the  areas targeted by this assay, and inadequate number of viral copies  (<250 copies / mL). A negative result must be combined with clinical  observations, patient history, and epidemiological information. If result is POSITIVE SARS-CoV-2 target nucleic acids are DETECTED. T he SARS-CoV-2 RNA is generally detectable in upper and lower  respiratory specimens during the acute phase of infection.  Positive  results are indicative of active infection with SARS-CoV-2.  Clinical  correlation with patient history and other diagnostic information is  necessary to determine patient infection status.  Positive results do  not rule out bacterial infection or co-infection with other viruses. If result is PRESUMPTIVE POSTIVE SARS-CoV-2 nucleic acids MAY BE PRESENT.   A presumptive positive result was obtained on the submitted specimen  and confirmed on repeat testing.  While 2019 novel coronavirus  (SARS-CoV-2) nucleic acids may be present in the submitted sample  additional confirmatory testing may be necessary for epidemiological  and / or clinical management  purposes  to differentiate between  SARS-CoV-2 and other Sarbecovirus currently known to infect humans.  If clinically indicated additional testing with an alternate test  methodology [phone]) is  advised. The SARS-CoV-2 RNA is generally  detectable in upper and lower respiratory specimens during the acute  phase of infection. The expected result is Negative. Fact Sheet for Patients:  StrictlyIdeas.no Fact Sheet for Healthcare Providers: BankingDealers.co.za This test is not yet approved or cleared by the [place] FDA and has been authorized for detection and/or diagnosis of SARS-CoV-2 by FDA under an Emergency Use Authorization (EUA).  This EUA will remain in effect (meaning this test can be used) for the duration of the COVID-19 declaration under Section 564(b)(1) of the Act, 21 U.S.C. section 360bbb-3(b)(1), unless the authorization is terminated or revoked sooner. Performed  at [facility], [place] [address], [place], [place] [address]   Blood Culture (routine x 2)     Status: None (Preliminary result)   Collection Time: 10/15/18  8:53 PM   Specimen: BLOOD  Result Value Ref Range Status   Specimen Description BLOOD RIGHT WRIST  Final   Special Requests   Final    BOTTLES DRAWN AEROBIC AND ANAEROBIC Blood Culture adequate volume   Culture   Final    NO GROWTH < 24 HOURS Performed at [facility], [place] [address], [place], [place] [address]    Report Status PENDING  Incomplete  Blood Culture (routine x 2)     Status: None (Preliminary result)   Collection Time: 10/15/18  9:18 PM   Specimen: BLOOD  Result Value Ref Range Status   Specimen Description BLOOD LEFT WRIST  Final   Special Requests   Final    BOTTLES DRAWN AEROBIC AND ANAEROBIC Blood Culture adequate volume   Culture   Final    NO GROWTH < 24 HOURS Performed at [facility], [place] [address] [place], [place] [address]    Report Status PENDING   Incomplete      Radiology Studies: Dg Chest Port 1 View  Result Date: 10/17/2018 CLINICAL DATA:  Acute respiratory distress secondary to COVID-19. EXAM: PORTABLE CHEST 1 VIEW COMPARISON:  10/15/2018 and 03/22/2015 FINDINGS: The bilateral hazy pulmonary infiltrates persist, unchanged. Heart size and vascularity are normal. No effusions. There is slight fullness in the superior mediastinum with shift of the trachea to the, increased from 03/22/2015. This could be due to tortuous brachiocephalic vessels with the possibility adenopathy or some other mass should be considered. No acute bone abnormality. IMPRESSION: No change in the appearance of the chest since the prior study. Stable bilateral hazy pulmonary infiltrates. I recommend CT scan of the chest with contrast in the future on an elective basis to evaluate the tracheal deviation which is chronic but increased. Electronically Signed   By: Lorriane Shire M.D.   On: 10/17/2018 09:02   Dg Chest Portable 1 View  Result Date: 10/15/2018 CLINICAL DATA:  Hypoxia EXAM: PORTABLE CHEST 1 VIEW COMPARISON:  March 22, 2015 FINDINGS: There is patchy airspace opacity in each lower lobe region and right mid lung. There is no frank consolidation. There is atelectatic change in the left base. There is a questionable small left pleural effusion. Heart is mildly enlarged with pulmonary vascularity normal. No adenopathy. There is degenerative change in the thoracic spine. IMPRESSION: Patchy airspace opacity in the right mid lung in each lower lobe, likely multifocal pneumonia. No consolidation in these areas of apparent pneumonia. There is left base atelectasis. Suspect minimal left pleural effusion. Heart mildly enlarged with pulmonary vascularity normal. No evident adenopathy. Electronically Signed   By: Lowella Grip III M.D.   On: 10/15/2018 19:19   US Abdomen Limited Ruq  Result Date: 10/16/2018 CLINICAL DATA:  78 year old male with abnormal LFTs. Positive  COVID-19. EXAM: ULTRASOUND ABDOMEN LIMITED RIGHT UPPER QUADRANT COMPARISON:  Renal ultrasound 09/23/2013. FINDINGS: Gallbladder: No gallstones or wall thickening visualized. No sonographic Murphy sign noted by sonographer. No pericholecystic fluid. Common bile duct: Diameter: 4 millimeters, normal. Liver: No focal lesion identified. Within normal limits in parenchymal echogenicity. Portal vein is patent on color Doppler imaging with normal direction of blood flow towards the liver. Other: Negative visible right kidney.  No free fluid. IMPRESSION: Negative right upper quadrant ultrasound. Electronically Signed   By: Lemmie Evens  [place] Crane M.D.   On: 10/16/2018 02:14    Scheduled Meds:  amLODipine  5 mg Oral Daily   aspirin EC  81 mg Oral Daily   atorvastatin  20 mg Oral QPM   carvedilol  12.5 mg Oral BID WC   dexamethasone (DECADRON) injection  6 mg Intravenous Q24H   enoxaparin (LOVENOX) injection  40 mg Subcutaneous Q24H   [START ON 10/18/2018] influenza vaccine adjuvanted  0.5 mL Intramuscular Tomorrow-1000   insulin aspart  0-20 Units Subcutaneous TID WC   insulin aspart  0-5 Units Subcutaneous QHS   insulin glargine  10 Units Subcutaneous Daily   sodium chloride flush  3 mL Intravenous Once   Continuous Infusions:  remdesivir 100 mg in NS 250 mL 100 mg (10/17/18 0817)     LOS: 2 days   Time spent: 35 minutes.  Patrecia Pour, MD [facility] [email or web address] Password [email or web address] 10/17/2018, 9:08 AM

## 2018-10-17 NOTE — Plan of Care (Signed)
Family (Son) called and updated. All questions answered. No other concerns at this time.

## 2018-10-18 ENCOUNTER — Encounter (HOSPITAL_COMMUNITY): Payer: Self-pay | Admitting: Family Medicine

## 2018-10-18 LAB — GLUCOSE, CAPILLARY
Glucose-Capillary: 180 mg/dL — ABNORMAL HIGH (ref 70–99)
Glucose-Capillary: 383 mg/dL — ABNORMAL HIGH (ref 70–99)
Glucose-Capillary: 266 mg/dL — ABNORMAL HIGH (ref 70–99)
Glucose-Capillary: 189 mg/dL — ABNORMAL HIGH (ref 70–99)

## 2018-10-18 LAB — ABO/RH: ABO/RH(D): A POS

## 2018-10-18 MED ORDER — SALINE SPRAY 0.65 % NA SOLN
1.0000 | NASAL | Status: DC | PRN
  Administered 2018-10-23 – 2018-10-31 (×5): 1 via NASAL
  Filled 2018-10-18: qty 44

## 2018-10-18 MED ORDER — SODIUM CHLORIDE 0.9% IV SOLUTION
Freq: Once | INTRAVENOUS | Status: AC
  Administered 2018-10-18: 11:00:00 via INTRAVENOUS

## 2018-10-18 MED ORDER — INSULIN ASPART 100 UNIT/ML ~~LOC~~ SOLN
5.0000 [IU] | Freq: Three times a day (TID) | SUBCUTANEOUS | Status: DC
  Administered 2018-10-18 – 2018-10-19 (×6): 5 [IU] via SUBCUTANEOUS

## 2018-10-18 NOTE — Progress Notes (Signed)
PHARMACY - PHYSICIAN COMMUNICATION CRITICAL VALUE ALERT - BLOOD CULTURE IDENTIFICATION (BCID)  Eddie Mcdaniel is an 79 y.o. male who presented to [facility] on 10/15/2018 with a chief complaint of progressive fatigue  Assessment:  Found to have COVID-19. WBC has trended up to 13.7 which is possibly steroid induced. Remains afebrile. 4 sets of blood cultures drawn on 9/16. 1/4 growing gram positive rods. No BCID results yet. Likely contaminant.   Name of physician (or Provider) Contacted: Dr. Bonner Puna   Current antibiotics: None   Changes to prescribed antibiotics recommended: Monitor off antibiotics. F/u BCID results  Recommendations accepted by provider  No results found for this or any previous visit.  Albertina Parr, PharmD., BCPS Clinical Pharmacist Clinical phone for 10/18/18 until 5pm: [phone]

## 2018-10-18 NOTE — Progress Notes (Addendum)
PROGRESS NOTE  Eddie Mcdaniel  [id number] DOB: 1941-01-08 DOA: 10/15/2018 PCP: Ann Held, DO   Brief Narrative: "Eddie Alfred" Mcdaniel is a 78 y.o. male with a history of CAD s/p DES to LAD, NIDT2DM, HTN, HLD, and GERD who presented to the ED 9/16 with progressive fatigue since 9/7 associated with diffuse weakness, poor per oral intake and intermittent fevers. In the ED he was hypoxic on room air with CXR demonstrating patchy airspace opacities without consolidation and possible left pleural effusion. CRP elevated at 9.1, PCT undetectable. Hypoxia progressed on 9/17 though CXR is stable and he is in no distress. Has declined convalescent plasma.   Assessment & Plan: Principal Problem:   COVID-19 virus infection Active Problems:   Uncontrolled type 2 diabetes mellitus without complication, without long-term current use of insulin (HCC)   Hyperlipidemia LDL goal <70   Essential hypertension   Acute respiratory failure with hypoxia (HCC)   Abnormal liver function   Hyponatremia  Acute hypoxic respiratory failure due to covid-19 pneumonia: High risk of progression to ARDS based on age, comorbidities, severely elevated inflammatory markers and CXR infiltrates.  - Continue remdesivir x5 days (9/17 - 9/21) - Continue steroids x10 days (9/17 - 9/26) - Continue supplemental oxygen as needed to maintain SpO2 >88%. Still on 6L HFNC and down to mid-80%'s on remote monitoring. D-dimer wnl. Inflammatory markers remain elevated. Discussed again at length with the patient who agrees with his son that plasma seems to have potential benefits that outweigh the risks. Will order now.  - With undetectable PCT, no development of infiltrate on my personal review of CXR this AM, and otherwise typical features of covid pneumonia, do not believe antibiotics are currently needed.  - Continue airborne, contact precautions. PPE including surgical gown, gloves, cap, shoe covers, and CAPR used during this  encounter in a negative pressure room.  - Check daily labs: CBC w/diff, CMP, d-dimer, ferritin, CRP.   - Maintain euvolemia. Check BNP. Consider lasix. - Avoid NSAIDs - Recommend proning and aggressive use of incentive spirometry.  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  - Will plan to check CT chest w/contrast per radiology recommendations, though the patient opts to defer this for the time being. Contrast allergy was after a LHC w/itching, so benefit > risk but will need to premedicate.   Left pleural effusion: Resolved on repeat CXR  Elevated d-dimer: Has normalized and is actually below age-adjusted cut-off.  - Continue prophylactic dose lovenox.  - Pt wishes to minimize lab draws, so will not trend.  AST elevation: Mild, consistent with covid-19 infection. RUQ U/S normal, LFTs otherwise reassuring. hepatitis panel negative. Normalized on 9/18 - Monitor LFTs - Ok to give statin  NIDT2DM: with steroid-induced hyperglycemia. HbA1c 6.5% - Hold metformin - Add 5u TIDWC to res SSI and lantus 10u BID  CAD s/p DES to LAD: No anginal symptoms. No ischemic features on ECG. - Continue home beta blocker, statin, ASA - Telemetry personally reviewed showing intermittent mild sinus bradycardia, limited 1st deg AVB without pauses and no other arrhythmia/ischemic changes noted. Can DC telemetry monitoring to facilitate mobility.   1 out of 4 blood culture bottles growing GPR (aerobic only): BCID pending. This is suggestive of contaminant, will monitor further culture data. D/w pharmacy.  HTN:  - Continue coreg and norvasc  HLD:  - Continue statin  DVT prophylaxis: Lovenox Code Status: Full Family Communication: None at bedside, speaking with son daily Disposition Plan: Uncertain, would  be eleigible for DC on 9/21 after 5th dose of remdesivir if improving clinically. Will need decreased severity of hypoxia.  Consultants:   None  Procedures:   Covid  convalescent plasma 9/19  Antimicrobials:  Remdesivir 9/17 - 9/21  Ceftriaxone, azithromycin 9/17   Subjective: Had BM, feels better. Ok to get plasma today and to get blood draw tomorrow. Willing to get out of bed more. Dyspnea remains moderate, about the same. No chest pain, palpitations or syncope. Eating ok. No change in cough.  Objective: Vitals:   10/17/18 1623 10/17/18 1923 10/18/18 0404 10/18/18 0747  BP: 123/66   113/60  Pulse: 72   76  Resp: (!) 22   (!) 24  Temp: 98.5 F (36.9 C) 98.8 F (37.1 C) 98.3 F (36.8 C) 98.8 F (37.1 C)  TempSrc: Oral Oral Oral Oral  SpO2: 92%   99%  Weight:      Height:        Intake/Output Summary (Last 24 hours) at 10/18/2018 1107 Last data filed at 10/18/2018 1039 Gross per 24 hour  Intake --  Output 400 ml  Net -400 ml   Filed Weights   10/16/18 0100 10/16/18 0309  Weight: 66.2 kg 63.4 kg   Gen: 78 y.o. male in no distress Pulm: Nonlabored tachypnea with 6LPM HFNC, Crackles bilaterally, stable. CV: Regular rate and rhythm. No murmur, rub, or gallop. No JVD, no dependent edema. GI: Abdomen soft, non-tender, non-distended, with normoactive bowel sounds.  Ext: Warm, no deformities Skin: No new rashes, lesions or ulcers on visualized skin. Neuro: Alert and oriented. No focal neurological deficits. Psych: Judgement and insight appear fair. Mood euthymic & affect congruent. Behavior is appropriate.    Data Reviewed: I have personally reviewed following labs and imaging studies  CBC: Recent Labs  Lab 10/15/18 2053 10/16/18 0610 10/17/18 0930  WBC 5.0 5.4 13.7*  NEUTROABS 3.5 4.7 12.4*  HGB 11.3* 12.0* 11.3*  HCT 31.0* 34.6* 32.6*  MCV 87.6 87.2 87.4  PLT 127* 159 [id number]   Basic Metabolic Panel: Recent Labs  Lab 10/15/18 2053 10/16/18 0610 10/17/18 0930  NA 134* 135 134*  K 4.3 3.6 3.6  CL 103 100 102  CO2 21* 24 21*  GLUCOSE 141* 219* 363*  BUN 15 10 21   CREATININE 0.85 0.79 0.81  CALCIUM 8.0* 8.3* 8.2*    GFR: Estimated Creatinine Clearance: 67.5 mL/min (by C-G formula based on SCr of 0.81 mg/dL). Liver Function Tests: Recent Labs  Lab 10/15/18 2053 10/16/18 0610 10/17/18 0930  AST 48* 37 41  ALT 26 26 31   ALKPHOS 39 43 46  BILITOT 1.2 0.6 0.6  PROT 6.3* 7.0 6.3*  ALBUMIN 2.9* 3.3* 3.0*   No results for input(s): LIPASE, AMYLASE in the last 168 hours. No results for input(s): AMMONIA in the last 168 hours. Coagulation Profile: No results for input(s): INR, PROTIME in the last 168 hours. Cardiac Enzymes: Recent Labs  Lab 10/16/18 0610  CKTOTAL 131  CKMB 3.5   BNP (last 3 results) No results for input(s): PROBNP in the last 8760 hours. HbA1C: No results for input(s): HGBA1C in the last 72 hours. CBG: Recent Labs  Lab 10/17/18 1124 10/17/18 1504 10/17/18 1622 10/17/18 2116 10/18/18 0710  GLUCAP 455* 303* 286* 261* 180*   Lipid Profile: Recent Labs    10/15/18 2051  TRIG 97   Thyroid Function Tests: No results for input(s): TSH, T4TOTAL, FREET4, T3FREE, THYROIDAB in the last 72 hours. Anemia Panel: Recent Labs  10/15/18 2053 10/17/18 0930  FERRITIN 365* 353*   Urine analysis:    Component Value Date/Time   COLORURINE AMBER (A) 10/15/2018 1730   APPEARANCEUR HAZY (A) 10/15/2018 1730   LABSPEC 1.019 10/15/2018 1730   PHURINE 5.0 10/15/2018 1730   GLUCOSEU NEGATIVE 10/15/2018 1730   HGBUR NEGATIVE 10/15/2018 1730   HGBUR small 11/18/2009 0910   BILIRUBINUR NEGATIVE 10/15/2018 1730   BILIRUBINUR neg 10/25/2015 1141   KETONESUR NEGATIVE 10/15/2018 1730   PROTEINUR 30 (A) 10/15/2018 1730   UROBILINOGEN 0.2 10/25/2015 1141   UROBILINOGEN negative 11/18/2009 0910   NITRITE NEGATIVE 10/15/2018 1730   LEUKOCYTESUR NEGATIVE 10/15/2018 1730   Recent Results (from the past 240 hour(s))  SARS Coronavirus 2 [facility] order, Performed in [facility] hospital lab) Nasopharyngeal Nasopharyngeal Swab     Status: Abnormal   Collection Time: 10/15/18  5:10 PM    Specimen: Nasopharyngeal Swab  Result Value Ref Range Status   SARS Coronavirus 2 POSITIVE (A) NEGATIVE Final    Comment: RESULT CALLED TO, READ BACK BY AND VERIFIED WITH: Marzella Schlein RN 1910 10/15/18 A BROWNING (NOTE) If result is NEGATIVE SARS-CoV-2 target nucleic acids are NOT DETECTED. The SARS-CoV-2 RNA is generally detectable in upper and lower  respiratory specimens during the acute phase of infection. The lowest  concentration of SARS-CoV-2 viral copies this assay can detect is 250  copies / mL. A negative result does not preclude SARS-CoV-2 infection  and should not be used as the sole basis for treatment or other  patient management decisions.  A negative result may occur with  improper specimen collection / handling, submission of specimen other  than nasopharyngeal swab, presence of viral mutation(s) within the  areas targeted by this assay, and inadequate number of viral copies  (<250 copies / mL). A negative result must be combined with clinical  observations, patient history, and epidemiological information. If result is POSITIVE SARS-CoV-2 target nucleic acids are DETECTED. T he SARS-CoV-2 RNA is generally detectable in upper and lower  respiratory specimens during the acute phase of infection.  Positive  results are indicative of active infection with SARS-CoV-2.  Clinical  correlation with patient history and other diagnostic information is  necessary to determine patient infection status.  Positive results do  not rule out bacterial infection or co-infection with other viruses. If result is PRESUMPTIVE POSTIVE SARS-CoV-2 nucleic acids MAY BE PRESENT.   A presumptive positive result was obtained on the submitted specimen  and confirmed on repeat testing.  While 2019 novel coronavirus  (SARS-CoV-2) nucleic acids may be present in the submitted sample  additional confirmatory testing may be necessary for epidemiological  and / or clinical management purposes  to  differentiate between  SARS-CoV-2 and other Sarbecovirus currently known to infect humans.  If clinically indicated additional testing with an alternate test  methodology [phone]) is  advised. The SARS-CoV-2 RNA is generally  detectable in upper and lower respiratory specimens during the acute  phase of infection. The expected result is Negative. Fact Sheet for Patients:  StrictlyIdeas.no Fact Sheet for Healthcare Providers: BankingDealers.co.za This test is not yet approved or cleared by the [place] FDA and has been authorized for detection and/or diagnosis of SARS-CoV-2 by FDA under an Emergency Use Authorization (EUA).  This EUA will remain in effect (meaning this test can be used) for the duration of the COVID-19 declaration under Section 564(b)(1) of the Act, 21 U.S.C. section 360bbb-3(b)(1), unless the authorization is terminated or revoked sooner. Performed at [facility], [place] [address] [place], [place] [address]   Blood Culture (routine x 2)     Status: None (Preliminary result)   Collection Time: 10/15/18  8:53 PM   Specimen: BLOOD  Result Value Ref Range Status   Specimen Description BLOOD RIGHT WRIST  Final   Special Requests   Final    BOTTLES DRAWN AEROBIC AND ANAEROBIC Blood Culture adequate volume   Culture  Setup Time   Final    GRAM POSITIVE RODS AEROBIC BOTTLE ONLY CRITICAL RESULT CALLED TO, READ BACK BY AND VERIFIED WITH: H.ZOMPA PHARMD, AT 1008 10/18/18 BY D. VANHOOK Performed at [facility], [place] [address] [place], [place] [address]    Culture GRAM POSITIVE RODS  Final   Report Status PENDING  Incomplete  Blood Culture (routine x 2)     Status: None (Preliminary result)   Collection Time: 10/15/18  9:18 PM   Specimen: BLOOD  Result Value Ref Range Status   Specimen Description BLOOD LEFT WRIST  Final   Special Requests   Final    BOTTLES DRAWN AEROBIC AND ANAEROBIC Blood Culture  adequate volume   Culture   Final    NO GROWTH 2 DAYS Performed at [facility], [place] [address], [place], [place] [address]    Report Status PENDING  Incomplete      Radiology Studies: Dg Chest Port 1 View  Result Date: 10/17/2018 CLINICAL DATA:  Acute respiratory distress secondary to COVID-19. EXAM: PORTABLE CHEST 1 VIEW COMPARISON:  10/15/2018 and 03/22/2015 FINDINGS: The bilateral hazy pulmonary infiltrates persist, unchanged. Heart size and vascularity are normal. No effusions. There is slight fullness in the superior mediastinum with shift of the trachea to the, increased from 03/22/2015. This could be due to tortuous brachiocephalic vessels with the possibility adenopathy or some other mass should be considered. No acute bone abnormality. IMPRESSION: No change in the appearance of the chest since the prior study. Stable bilateral hazy pulmonary infiltrates. I recommend CT scan of the chest with contrast in the future on an elective basis to evaluate the tracheal deviation which is chronic but increased. Electronically Signed   By: Lorriane Shire M.D.   On: 10/17/2018 09:02    Scheduled Meds:  sodium chloride   Intravenous Once   amLODipine  5 mg Oral Daily   aspirin EC  81 mg Oral Daily   atorvastatin  20 mg Oral QPM   carvedilol  12.5 mg Oral BID WC   dexamethasone (DECADRON) injection  6 mg Intravenous Q24H   enoxaparin (LOVENOX) injection  40 mg Subcutaneous Q24H   influenza vaccine adjuvanted  0.5 mL Intramuscular Tomorrow-1000   insulin aspart  0-20 Units Subcutaneous TID WC   insulin aspart  0-5 Units Subcutaneous QHS   insulin aspart  5 Units Subcutaneous TID WC   insulin glargine  10 Units Subcutaneous BID   sodium chloride flush  3 mL Intravenous Once   Continuous Infusions:  remdesivir 100 mg in NS 250 mL       LOS: 3 days   Time spent: 35 minutes.  Patrecia Pour, MD [facility] [email or web address] Password [email or web address] 10/18/2018, 11:07 AM

## 2018-10-18 NOTE — Progress Notes (Signed)
Pt son updated. Dr informed to call per pt son request. Pt placed in prone position per order. Will continue to monitor.

## 2018-10-18 NOTE — Plan of Care (Signed)

## 2018-10-18 NOTE — TOC Progression Note (Signed)
Transition of Care [facility]) - Progression Note    Patient Details  Name: CLEAVE SHROFF MRN: [id number] Date of Birth: [birth year]  Transition of [facility] CM/SW Contact  Ninfa Meeker, RN Phone Number: [phone] (working remotely) 10/18/2018, 11:14 AM  Clinical Narrative: y89 yr old gentleman admitted and being treated for COVID 19. He lives home with spouse.  Case manager spoke with patient via telephone concerning discharge needs. Choice was offered for [facility] agency and DME agency. Referral was called to Adela Lank, [facility] Liaison. Case manager will follow for oxygen needs. Will contact Learta Codding with [employer] if needed and request tub seat.       Expected Discharge Plan: [place] Barriers to Discharge: Continued Medical Work up  Expected Discharge Plan and Services Expected Discharge Plan: [place]   Discharge Planning Services: CM Consult Post Acute Care Choice: Durable Medical Equipment, Home Health Living arrangements for the past 2 months: Single Family Home                 DME Arranged: Tub bench DME Agency: [place]       HH Arranged: PT, OT [place] Agency: [place] Date [place]: 10/18/18 Time [place]: 1112 Representative spoke with at [place]: Adela Lank   Social Determinants of Health (SDOH) Interventions    Readmission Risk Interventions No flowsheet data found.

## 2018-10-18 NOTE — Progress Notes (Signed)
Called and spoke with patient's son, Konrad Dolores.  All questions and concerns addressed.  Earleen Reaper RN

## 2018-10-18 NOTE — Progress Notes (Signed)
Physical Therapy Treatment Patient Details Name: Eddie Mcdaniel MRN: [id number] DOB: 1940/03/10 Today's Date: 10/18/2018    History of Present Illness 78 y.o. male,  w hypertension, hyperlipidemia, Dm2, CAD, rt rotator cuff repair x2 apparently presents with c/o feeling sick, fever last week, and has had poor po intake and poor energy,  and odd bitter taste in his mouth. Pt denies any travel or covid exposure. Pt admitted 10/15/18 for acute respiratory failure with hypoxia secondary to Covid -19, CAP; +lt pleural effusion    PT Comments    Pt remains unsteady on his feet today, and unlike last session he cannot be weaned below 10 L during gait as he decreased to 80% with mobility on 10L HFNC.  Pt seems a bit more dyspnic during gait than last described as well, requiring 5 mins of seated rest between trips to the door and back to the chair to recover breathing and O2 saturations.  PT will continue to follow acutely for safe mobility progression   Follow Up Recommendations  No PT follow up     Equipment Recommendations  None recommended by PT    Recommendations for Other Services   NA     Precautions / Restrictions Precautions Precautions: Fall;Other (comment) Precaution Comments: monitor O2    Mobility  Bed Mobility Overal bed mobility: Needs Assistance Bed Mobility: Supine to Sit     Supine to sit: Min assist     General bed mobility comments: Min assist to get out of prone and provide hand to pull up to sitting EOB.   Transfers Overall transfer level: Needs assistance   Transfers: Sit to/from Stand Sit to Stand: Supervision         General transfer comment: supervision for safety  Ambulation/Gait Ambulation/Gait assistance: Min guard Gait Distance (Feet): 20 Feet(x2 with 5 min seated rest break) Assistive device: 1 person hand held assist Gait Pattern/deviations: Step-through pattern;Staggering left;Staggering right     General Gait Details: Pt with mildly  staggering gait pattern, O2 decreased to 80% on 10 L and pt takes ~ 5 mins to recover with seated rest break before going agin.           Balance Overall balance assessment: Needs assistance Sitting-balance support: Feet supported;No upper extremity supported Sitting balance-Leahy Scale: Good     Standing balance support: Bilateral upper extremity supported;No upper extremity supported;Single extremity supported Standing balance-Leahy Scale: Fair                              Cognition Arousal/Alertness: Awake/alert Behavior During Therapy: WFL for tasks assessed/performed Overall Cognitive Status: Within Functional Limits for tasks assessed                                 General Comments: delayed processing, requires repetition at times but suspect this is more likely due difficulty hearing      Exercises Other Exercises Other Exercises: IS x 5 reps with good technique.  Max inspired 750        Pertinent Vitals/Pain Pain Assessment: No/denies pain           PT Goals (current goals can now be found in the care plan section) Acute Rehab PT Goals Patient Stated Goal: get better and return home Progress towards PT goals: Progressing toward goals    Frequency    Min 3X/week  PT Plan Current plan remains appropriate       AM-PAC PT "6 Clicks" Mobility   Outcome Measure  Help needed turning from your back to your side while in a flat bed without using bedrails?: None Help needed moving from lying on your back to sitting on the side of a flat bed without using bedrails?: A Little Help needed moving to and from a bed to a chair (including a wheelchair)?: A Little Help needed standing up from a chair using your arms (e.g., wheelchair or bedside chair)?: A Little Help needed to walk in hospital room?: A Little Help needed climbing 3-5 steps with a railing? : A Little 6 Click Score: 19    End of Session Equipment Utilized During  Treatment: Oxygen Activity Tolerance: Patient limited by fatigue Patient left: in chair;with call bell/phone within reach;with nursing/sitter in room Nurse Communication: Mobility status;Other (comment)(decreased sats) PT Visit Diagnosis: Unsteadiness on feet (R26.81);Difficulty in walking, not elsewhere classified (R26.2)     Time: [id number] PT Time Calculation (min) (ACUTE ONLY): 21 min  Charges:  $Gait Training: 8-22 mins                    Jocob Dambach B. Tandra Rosado, PT, DPT  Acute Rehabilitation [phone] pager [phone] office  @ [facility]: [phone]   10/18/2018, 3:59 PM

## 2018-10-19 LAB — COMPREHENSIVE METABOLIC PANEL
ALT: 28 U/L (ref 0–44)
AST: 31 U/L (ref 15–41)
Albumin: 3 g/dL — ABNORMAL LOW (ref 3.5–5.0)
Alkaline Phosphatase: 51 U/L (ref 38–126)
Anion gap: 9 (ref 5–15)
BUN: 19 mg/dL (ref 8–23)
CO2: 27 mmol/L (ref 22–32)
Calcium: 8.7 mg/dL — ABNORMAL LOW (ref 8.9–10.3)
Chloride: 101 mmol/L (ref 98–111)
Creatinine, Ser: 0.61 mg/dL (ref 0.61–1.24)
GFR calc Af Amer: >60 mL/min (ref >60)
GFR calc non Af Amer: >60 mL/min (ref >60)
Glucose, Bld: 129 mg/dL — ABNORMAL HIGH (ref 70–99)
Potassium: 4 mmol/L (ref 3.5–5.1)
Sodium: 137 mmol/L (ref 135–145)
Total Bilirubin: 0.5 mg/dL (ref 0.3–1.2)
Total Protein: 6.1 g/dL — ABNORMAL LOW (ref 6.5–8.1)

## 2018-10-19 LAB — CBC WITH DIFFERENTIAL/PLATELET
Abs Immature Granulocytes: 0.31 10*3/uL — ABNORMAL HIGH (ref 0.00–0.07)
Basophils Absolute: 0 10*3/uL (ref 0.0–0.1)
Basophils Relative: 0 %
Eosinophils Absolute: 0 10*3/uL (ref 0.0–0.5)
Eosinophils Relative: 0 %
HCT: 32.4 % — ABNORMAL LOW (ref 39.0–52.0)
Hemoglobin: 11.3 g/dL — ABNORMAL LOW (ref 13.0–17.0)
Immature Granulocytes: 2 %
Lymphocytes Relative: 5 %
Lymphs Abs: 0.7 10*3/uL (ref 0.7–4.0)
MCH: 30 pg (ref 26.0–34.0)
MCHC: 34.9 g/dL (ref 30.0–36.0)
MCV: 85.9 fL (ref 80.0–100.0)
Monocytes Absolute: 0.7 10*3/uL (ref 0.1–1.0)
Monocytes Relative: 5 %
Neutro Abs: 13.9 10*3/uL — ABNORMAL HIGH (ref 1.7–7.7)
Neutrophils Relative %: 88 %
Platelets: 260 10*3/uL (ref 150–400)
RBC: 3.77 MIL/uL — ABNORMAL LOW (ref 4.22–5.81)
RDW: 12.1 % (ref 11.5–15.5)
WBC: 15.7 10*3/uL — ABNORMAL HIGH (ref 4.0–10.5)
nRBC: 0 % (ref 0.0–0.2)

## 2018-10-19 LAB — CULTURE, BLOOD (ROUTINE X 2): Special Requests: ADEQUATE

## 2018-10-19 LAB — GLUCOSE, CAPILLARY
Glucose-Capillary: 200 mg/dL — ABNORMAL HIGH (ref 70–99)
Glucose-Capillary: 401 mg/dL — ABNORMAL HIGH (ref 70–99)
Glucose-Capillary: 406 mg/dL — ABNORMAL HIGH (ref 70–99)
Glucose-Capillary: 417 mg/dL — ABNORMAL HIGH (ref 70–99)

## 2018-10-19 LAB — C-REACTIVE PROTEIN: CRP: 2.4 mg/dL — ABNORMAL HIGH (ref <1.0)

## 2018-10-19 MED ORDER — ORAL CARE MOUTH RINSE
15.0000 mL | Freq: Two times a day (BID) | OROMUCOSAL | Status: DC
  Administered 2018-10-19 – 2018-11-04 (×32): 15 mL via OROMUCOSAL

## 2018-10-19 NOTE — Progress Notes (Signed)
Patient given all morning medication, well tolerated, Patient son updated. Patient placed in prone position. Will continue to monitor for remainder of shift.

## 2018-10-19 NOTE — Progress Notes (Signed)
Called and spoke with patient's son, Eddie Mcdaniel.  Updated on patient's condition and all questions/concerns addressed.  Earleen Reaper RN

## 2018-10-19 NOTE — Progress Notes (Signed)
PROGRESS NOTE  Eddie Mcdaniel  [id number] DOB: 12/03/1940 DOA: 10/15/2018 PCP: Ann Held, DO   Brief Narrative: "Eddie Alfred" Mcdaniel is a 78 y.o. male with a history of CAD s/p DES to LAD, NIDT2DM, HTN, HLD, and GERD who presented to the ED 9/16 with progressive fatigue since 9/7 associated with diffuse weakness, poor per oral intake and intermittent fevers. In the ED he was hypoxic on room air with CXR demonstrating patchy airspace opacities without consolidation and possible left pleural effusion. CRP elevated at 9.1, PCT undetectable. Hypoxia progressed on 9/17 though CXR is stable and he is in no distress. Has declined convalescent plasma.   Assessment & Plan: Principal Problem:   COVID-19 virus infection Active Problems:   Uncontrolled type 2 diabetes mellitus without complication, without long-term current use of insulin (HCC)   Hyperlipidemia LDL goal <70   Essential hypertension   Coronary artery disease involving native coronary artery of native heart without angina pectoris   Acute respiratory failure with hypoxia (HCC)   Abnormal liver function   Hyponatremia  Acute hypoxic respiratory failure due to covid-19 pneumonia: High risk of progression to ARDS based on age, comorbidities, severely elevated inflammatory markers and CXR infiltrates.  - Continue remdesivir x5 days (9/17 - 9/21) - Continue steroids x10 days (9/17 - 9/26) - Received convalescent plasma 9/19. - Continue supplemental oxygen as needed to maintain SpO2 >88%. Requiring 7L HFNC and up to 10L w/exertion.   - Continue airborne, contact precautions. PPE including surgical gown, gloves, cap, shoe covers, and CAPR used during this encounter in a negative pressure room.  - Check intermittent labs. Inflammatory marker showing sustained improvement today (CRP 9.1 >> 2.4) - Maintain euvolemia. Not overloaded on CXR or exam. - Avoid NSAIDs - Recommend proning and aggressive use of incentive spirometry.   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  - Will plan to check CT chest w/contrast per radiology recommendations, though the patient opts to defer this for the time being. Contrast allergy was after a LHC w/itching, so benefit > risk but will need to premedicate.   Left pleural effusion: Resolved on repeat CXR  Elevated d-dimer: Has normalized and is actually below age-adjusted cut-off.  - Continue prophylactic dose lovenox.  - Pt wishes to minimize lab draws, so will not trend.  AST elevation: Mild, consistent with covid-19 infection. RUQ U/S normal, LFTs otherwise reassuring. hepatitis panel negative. Normalized on 9/18 - Monitor LFTs - Ok to give statin  NIDT2DM: with steroid-induced hyperglycemia. HbA1c 6.5% - Hold metformin - Added 5u TIDWC to res SSI and lantus 10u BID  CAD s/p DES to LAD: No anginal symptoms. No ischemic features on ECG. No significant arrhythmias noted on telemetry so this was discontinued.  - Continue home beta blocker, statin, ASA  1 out of 4 blood culture bottles growing diphtheroids: This is suggestive of contaminant and will not receive targeted treatment. D/w pharmacy.  HTN:  - Continue coreg and norvasc  HLD:  - Continue statin  DVT prophylaxis: Lovenox Code Status: Full Family Communication: None at bedside, speaking with son daily Disposition Plan: Uncertain, depending on improvement with hypoxia.   Consultants:   None  Procedures:   Covid convalescent plasma 9/19  Antimicrobials:  Remdesivir 9/17 - 9/21  Ceftriaxone, azithromycin 9/17   Subjective: Required an increase to 10L HFNC with exertion yesterday, but continues not to report much dyspnea, just very weak overall. No chest pain reported today. Did not recall getting plasma  until I showed it to him on the IV pole.  Objective: Vitals:   10/19/18 0435 10/19/18 0500 10/19/18 0552 10/19/18 0900  BP: (!) 147/87   121/60  Pulse: 73 63 (!) 56 65   Resp: (!) 26  19 18   Temp: 98.2 F (36.8 C)   97.8 F (36.6 C)  TempSrc: Oral   Oral  SpO2: 95% 95% 93% (!) 87%  Weight:      Height:        Intake/Output Summary (Last 24 hours) at 10/19/2018 1034 Last data filed at 10/19/2018 0720 Gross per 24 hour  Intake 1320 ml  Output 1275 ml  Net 45 ml   Filed Weights   10/16/18 0100 10/16/18 0309  Weight: 66.2 kg 63.4 kg   Gen: 78 y.o. male in no distress Pulm: Nonlabored with elevated rate, no accessory muscle use. Crackles in midlung fields bilaterally. No wheezing. CV: Regular rate and rhythm. No murmur, rub, or gallop. No JVD, no noted dependent edema. GI: Abdomen soft, non-tender, non-distended, with normoactive bowel sounds.  Ext: Warm, no deformities Skin: No rashes, lesions or ulcers on visualized skin. Neuro: Alert and oriented. No focal neurological deficits. Psych: Judgement and insight appear fair. Mood euthymic & affect congruent. Behavior is appropriate.     Data Reviewed: I have personally reviewed following labs and imaging studies  CBC: Recent Labs  Lab 10/15/18 2053 10/16/18 0610 10/17/18 0930 10/19/18 0147  WBC 5.0 5.4 13.7* 15.7*  NEUTROABS 3.5 4.7 12.4* 13.9*  HGB 11.3* 12.0* 11.3* 11.3*  HCT 31.0* 34.6* 32.6* 32.4*  MCV 87.6 87.2 87.4 85.9  PLT 127* 159 210 [id number]   Basic Metabolic Panel: Recent Labs  Lab 10/15/18 2053 10/16/18 0610 10/17/18 0930 10/19/18 0147  NA 134* 135 134* 137  K 4.3 3.6 3.6 4.0  CL 103 100 102 101  CO2 21* 24 21* 27  GLUCOSE 141* 219* 363* 129*  BUN 15 10 21 19   CREATININE 0.85 0.79 0.81 0.61  CALCIUM 8.0* 8.3* 8.2* 8.7*   GFR: Estimated Creatinine Clearance: 68.3 mL/min (by C-G formula based on SCr of 0.61 mg/dL). Liver Function Tests: Recent Labs  Lab 10/15/18 2053 10/16/18 0610 10/17/18 0930 10/19/18 0147  AST 48* 37 41 31  ALT 26 26 31 28   ALKPHOS 39 43 46 51  BILITOT 1.2 0.6 0.6 0.5  PROT 6.3* 7.0 6.3* 6.1*  ALBUMIN 2.9* 3.3* 3.0* 3.0*   No results for  input(s): LIPASE, AMYLASE in the last 168 hours. No results for input(s): AMMONIA in the last 168 hours. Coagulation Profile: No results for input(s): INR, PROTIME in the last 168 hours. Cardiac Enzymes: Recent Labs  Lab 10/16/18 0610  CKTOTAL 131  CKMB 3.5   BNP (last 3 results) No results for input(s): PROBNP in the last 8760 hours. HbA1C: No results for input(s): HGBA1C in the last 72 hours. CBG: Recent Labs  Lab 10/17/18 2116 10/18/18 0710 10/18/18 1112 10/18/18 1652 10/18/18 2120  GLUCAP 261* 180* 383* 266* 189*   Lipid Profile: No results for input(s): CHOL, HDL, LDLCALC, TRIG, CHOLHDL, LDLDIRECT in the last 72 hours. Thyroid Function Tests: No results for input(s): TSH, T4TOTAL, FREET4, T3FREE, THYROIDAB in the last 72 hours. Anemia Panel: Recent Labs    10/17/18 0930  FERRITIN 353*   Urine analysis:    Component Value Date/Time   COLORURINE AMBER (A) 10/15/2018 1730   APPEARANCEUR HAZY (A) 10/15/2018 1730   LABSPEC 1.019 10/15/2018 1730   PHURINE 5.0 10/15/2018 1730  GLUCOSEU NEGATIVE 10/15/2018 1730   HGBUR NEGATIVE 10/15/2018 1730   HGBUR small 11/18/2009 0910   BILIRUBINUR NEGATIVE 10/15/2018 1730   BILIRUBINUR neg 10/25/2015 1141   KETONESUR NEGATIVE 10/15/2018 1730   PROTEINUR 30 (A) 10/15/2018 1730   UROBILINOGEN 0.2 10/25/2015 1141   UROBILINOGEN negative 11/18/2009 0910   NITRITE NEGATIVE 10/15/2018 1730   LEUKOCYTESUR NEGATIVE 10/15/2018 1730   Recent Results (from the past 240 hour(s))  SARS Coronavirus 2 [facility] order, Performed in [facility] hospital lab) Nasopharyngeal Nasopharyngeal Swab     Status: Abnormal   Collection Time: 10/15/18  5:10 PM   Specimen: Nasopharyngeal Swab  Result Value Ref Range Status   SARS Coronavirus 2 POSITIVE (A) NEGATIVE Final    Comment: RESULT CALLED TO, READ BACK BY AND VERIFIED WITH: Marzella Schlein RN 1910 10/15/18 A BROWNING (NOTE) If result is NEGATIVE SARS-CoV-2 target nucleic acids are NOT DETECTED.  The SARS-CoV-2 RNA is generally detectable in upper and lower  respiratory specimens during the acute phase of infection. The lowest  concentration of SARS-CoV-2 viral copies this assay can detect is 250  copies / mL. A negative result does not preclude SARS-CoV-2 infection  and should not be used as the sole basis for treatment or other  patient management decisions.  A negative result may occur with  improper specimen collection / handling, submission of specimen other  than nasopharyngeal swab, presence of viral mutation(s) within the  areas targeted by this assay, and inadequate number of viral copies  (<250 copies / mL). A negative result must be combined with clinical  observations, patient history, and epidemiological information. If result is POSITIVE SARS-CoV-2 target nucleic acids are DETECTED. T he SARS-CoV-2 RNA is generally detectable in upper and lower  respiratory specimens during the acute phase of infection.  Positive  results are indicative of active infection with SARS-CoV-2.  Clinical  correlation with patient history and other diagnostic information is  necessary to determine patient infection status.  Positive results do  not rule out bacterial infection or co-infection with other viruses. If result is PRESUMPTIVE POSTIVE SARS-CoV-2 nucleic acids MAY BE PRESENT.   A presumptive positive result was obtained on the submitted specimen  and confirmed on repeat testing.  While 2019 novel coronavirus  (SARS-CoV-2) nucleic acids may be present in the submitted sample  additional confirmatory testing may be necessary for epidemiological  and / or clinical management purposes  to differentiate between  SARS-CoV-2 and other Sarbecovirus currently known to infect humans.  If clinically indicated additional testing with an alternate test  methodology [phone]) is  advised. The SARS-CoV-2 RNA is generally  detectable in upper and lower respiratory specimens during the acute   phase of infection. The expected result is Negative. Fact Sheet for Patients:  StrictlyIdeas.no Fact Sheet for Healthcare Providers: BankingDealers.co.za This test is not yet approved or cleared by the [place] FDA and has been authorized for detection and/or diagnosis of SARS-CoV-2 by FDA under an Emergency Use Authorization (EUA).  This EUA will remain in effect (meaning this test can be used) for the duration of the COVID-19 declaration under Section 564(b)(1) of the Act, 21 U.S.C. section 360bbb-3(b)(1), unless the authorization is terminated or revoked sooner. Performed at [facility], [place] [address] [place], [place] [address]   Blood Culture (routine x 2)     Status: Abnormal   Collection Time: 10/15/18  8:53 PM   Specimen: BLOOD  Result Value Ref Range Status   Specimen Description BLOOD  RIGHT WRIST  Final   Special Requests   Final    BOTTLES DRAWN AEROBIC AND ANAEROBIC Blood Culture adequate volume   Culture  Setup Time   Final    GRAM POSITIVE RODS AEROBIC BOTTLE ONLY CRITICAL RESULT CALLED TO, READ BACK BY AND VERIFIED WITH: H.ZOMPA PHARMD, AT 1008 10/18/18 BY D. VANHOOK    Culture (A)  Final    DIPHTHEROIDS(CORYNEBACTERIUM SPECIES) Standardized susceptibility testing for this organism is not available. Performed at [facility], [place] [address], [place], [place] [address]    Report Status 10/19/2018 FINAL  Final  Blood Culture (routine x 2)     Status: None (Preliminary result)   Collection Time: 10/15/18  9:18 PM   Specimen: BLOOD  Result Value Ref Range Status   Specimen Description BLOOD LEFT WRIST  Final   Special Requests   Final    BOTTLES DRAWN AEROBIC AND ANAEROBIC Blood Culture adequate volume   Culture   Final    NO GROWTH 3 DAYS Performed at [facility], [place] [address] [place], [place] [address]    Report Status PENDING  Incomplete      Radiology Studies: No results found.   Scheduled Meds: . amLODipine  5 mg Oral Daily  . aspirin EC  81 mg Oral Daily  . atorvastatin  20 mg Oral QPM  . carvedilol  12.5 mg Oral BID WC  . dexamethasone (DECADRON) injection  6 mg Intravenous Q24H  . enoxaparin (LOVENOX) injection  40 mg Subcutaneous Q24H  . influenza vaccine adjuvanted  0.5 mL Intramuscular Tomorrow-1000  . insulin aspart  0-20 Units Subcutaneous TID WC  . insulin aspart  0-5 Units Subcutaneous QHS  . insulin aspart  5 Units Subcutaneous TID WC  . insulin glargine  10 Units Subcutaneous BID  . mouth rinse  15 mL Mouth Rinse BID  . sodium chloride flush  3 mL Intravenous Once   Continuous Infusions: . remdesivir 100 mg in NS 250 mL 100 mg (10/19/18 0923)     LOS: 4 days   Time spent: 35 minutes.  Patrecia Pour, MD [facility] [email or web address] Password [facility] 10/19/2018, 10:34 AM

## 2018-10-20 ENCOUNTER — Inpatient Hospital Stay (HOSPITAL_COMMUNITY): Payer: Medicare HMO

## 2018-10-20 LAB — CULTURE, BLOOD (ROUTINE X 2)
Culture: NO GROWTH
Special Requests: ADEQUATE

## 2018-10-20 LAB — PREPARE FRESH FROZEN PLASMA: Unit division: 0

## 2018-10-20 LAB — GLUCOSE, CAPILLARY
Glucose-Capillary: 326 mg/dL — ABNORMAL HIGH (ref 70–99)
Glucose-Capillary: 229 mg/dL — ABNORMAL HIGH (ref 70–99)
Glucose-Capillary: 321 mg/dL — ABNORMAL HIGH (ref 70–99)
Glucose-Capillary: 338 mg/dL — ABNORMAL HIGH (ref 70–99)
Glucose-Capillary: 396 mg/dL — ABNORMAL HIGH (ref 70–99)

## 2018-10-20 LAB — BPAM FFP
Blood Product Expiration Date: 202009201215
ISSUE DATE / TIME: 202009191252
Unit Type and Rh: 6200

## 2018-10-20 MED ORDER — DIPHENHYDRAMINE HCL 25 MG PO CAPS
50.0000 mg | ORAL_CAPSULE | Freq: Once | ORAL | Status: AC
  Administered 2018-10-20: 50 mg via ORAL
  Filled 2018-10-20: qty 2

## 2018-10-20 MED ORDER — INSULIN GLARGINE 100 UNIT/ML ~~LOC~~ SOLN
20.0000 [IU] | Freq: Two times a day (BID) | SUBCUTANEOUS | Status: DC
  Administered 2018-10-20 (×2): 20 [IU] via SUBCUTANEOUS
  Filled 2018-10-20 (×3): qty 0.2

## 2018-10-20 MED ORDER — IOHEXOL 300 MG/ML  SOLN: 100.0000 mL | Freq: Once | INTRAMUSCULAR | Status: DC | PRN

## 2018-10-20 MED ORDER — METHYLPREDNISOLONE SODIUM SUCC 125 MG IJ SOLR
60.0000 mg | Freq: Four times a day (QID) | INTRAMUSCULAR | Status: AC
  Administered 2018-10-20 (×2): 60 mg via INTRAVENOUS
  Filled 2018-10-20 (×2): qty 2

## 2018-10-20 MED ORDER — INSULIN ASPART 100 UNIT/ML ~~LOC~~ SOLN
8.0000 [IU] | Freq: Three times a day (TID) | SUBCUTANEOUS | Status: DC
  Administered 2018-10-20 (×3): 8 [IU] via SUBCUTANEOUS

## 2018-10-20 MED ORDER — IOHEXOL 300 MG/ML  SOLN
75.0000 mL | Freq: Once | INTRAMUSCULAR | Status: AC | PRN
  Administered 2018-10-20: 75 mL via INTRAVENOUS

## 2018-10-20 NOTE — Plan of Care (Signed)
  Problem: Education: Goal: Knowledge of risk factors and measures for prevention of condition will improve Outcome: Progressing   Problem: Respiratory: Goal: Will maintain a patent airway Outcome: Progressing Goal: Complications related to the disease process, condition or treatment will be avoided or minimized Outcome: Progressing   Problem: Education: Goal: Knowledge of General Education information will improve Description: Including pain rating scale, medication(s)/side effects and non-pharmacologic comfort measures Outcome: Progressing   Problem: Health Behavior/Discharge Planning: Goal: Ability to manage health-related needs will improve Outcome: Progressing   Problem: Clinical Measurements: Goal: Ability to maintain clinical measurements within normal limits will improve Outcome: Progressing Goal: Will remain free from infection Outcome: Progressing Goal: Diagnostic test results will improve Outcome: Progressing Goal: Respiratory complications will improve Outcome: Progressing Goal: Cardiovascular complication will be avoided Outcome: Progressing   Problem: Nutrition: Goal: Adequate nutrition will be maintained Outcome: Progressing   Problem: Coping: Goal: Level of anxiety will decrease Outcome: Progressing   Problem: Elimination: Goal: Will not experience complications related to bowel motility Outcome: Progressing Goal: Will not experience complications related to urinary retention Outcome: Progressing   Problem: Pain Managment: Goal: General experience of comfort will improve Outcome: Progressing   Problem: Safety: Goal: Ability to remain free from injury will improve Outcome: Progressing   Problem: Skin Integrity: Goal: Risk for impaired skin integrity will decrease Outcome: Progressing

## 2018-10-20 NOTE — Progress Notes (Signed)
PROGRESS NOTE  Eddie Mcdaniel  [name] DOB: Jul 17, 1940 DOA: 10/15/2018 PCP: Ann Held, DO   Brief Narrative: "Eddie Alfred" Mcdaniel is a 78 y.o. male with a history of CAD s/p DES to LAD, NIDT2DM, HTN, HLD, and GERD who presented to the ED 9/16 with progressive fatigue since 9/7 associated with diffuse weakness, poor per oral intake and intermittent fevers. In the ED he was hypoxic on room air with CXR demonstrating patchy airspace opacities without consolidation and possible left pleural effusion. CRP elevated at 9.1, PCT undetectable. Hypoxia progressed on 9/17 though CXR is stable and he is in no distress. Has declined convalescent plasma.   Assessment & Plan: Principal Problem:   COVID-19 virus infection Active Problems:   Uncontrolled type 2 diabetes mellitus without complication, without long-term current use of insulin (HCC)   Hyperlipidemia LDL goal <70   Essential hypertension   Coronary artery disease involving native coronary artery of native heart without angina pectoris   Acute respiratory failure with hypoxia (HCC)   Abnormal liver function   Hyponatremia  Acute hypoxic respiratory failure due to covid-19 pneumonia: High risk of progression to ARDS based on age, comorbidities, severely elevated inflammatory markers and CXR infiltrates.  - Completed remdesivir x5 days (9/17 - 9/21) - Continue steroids x10 days (9/17 - 9/26) - Received convalescent plasma 9/19. - Continue supplemental oxygen as needed to maintain SpO2 >88%. Requiring 7L HFNC and up to 10L w/exertion. Get OOB, up w/assist and continue regular PT. - Continue airborne, contact precautions. PPE including surgical gown, gloves, cap, shoe covers, and CAPR used during this encounter in a negative pressure room.  - Check intermittent labs. Inflammatory marker showing sustained improvement (CRP 9.1 >> 2.4) - Maintain euvolemia. Not overloaded on CXR or exam. Check CT chest due to tracheal deviation below  and given sustained hypoxia despite improvement in labs. - Avoid NSAIDs - Recommend proning and aggressive use of incentive spirometry.  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  - Check CT chest today. Patient and son consent. This will also r/o alternative causes for ongoing hypoxia. Pretreatment with steroids and benadryl ordered.  Left pleural effusion: Resolved on repeat CXR  Elevated d-dimer: Has normalized and is actually below age-adjusted cut-off.  - Continue prophylactic dose lovenox.  - Pt wishes to minimize lab draws, so will not trend.  AST elevation: Mild, consistent with covid-19 infection. RUQ U/S normal, LFTs otherwise reassuring. hepatitis panel negative. Normalized on 9/18 - Monitor LFTs - Ok to give statin  NIDT2DM: with steroid-induced hyperglycemia. With HbA1c 6.5% and elevations into 400's despite significant insulin, he is clearly very steroid-sensitive. - Hold metformin - Augment insulin at mealtime and basal, continue following.   CAD s/p DES to LAD: No anginal symptoms. No ischemic features on ECG. No significant arrhythmias noted on telemetry so this was discontinued.  - Continue home beta blocker, statin, ASA  1 out of 4 blood culture bottles growing diphtheroids: This is suggestive of contaminant and will not receive targeted treatment. D/w pharmacy.  HTN:  - Continue coreg and norvasc  HLD:  - Continue statin  DVT prophylaxis: Lovenox Code Status: Full Family Communication: None at bedside, speaking with son daily Disposition Plan: Uncertain, depending on improvement with hypoxia.   Consultants:   None  Procedures:   Covid convalescent plasma 9/19  Antimicrobials:  Remdesivir 9/17 - 9/21  Ceftriaxone, azithromycin 9/17   Subjective: Oxygen requirements down slightly when proning. Denies shortness of breath but gets  obviously short of breath with any exertion. No chest pain. Eating well. Has not been getting  up over the weekend.   Objective: Vitals:   10/20/18 0500 10/20/18 0600 10/20/18 0641 10/20/18 0700  BP:    (!) 150/82  Pulse: 62 75 62   Resp:      Temp:    97.7 F (36.5 C)  TempSrc:      SpO2: 95% (!) 83% 96% 93%  Weight:      Height:        Intake/Output Summary (Last 24 hours) at 10/20/2018 1105 Last data filed at 10/20/2018 0600 Gross per 24 hour  Intake 970 ml  Output 700 ml  Net 270 ml   Filed Weights   10/16/18 0100 10/16/18 0309  Weight: 66.2 kg 63.4 kg   Gen: 78 y.o. male in no distress Pulm: Nonlabored breathing HFNC, crackles bilaterally, minimal. No wheezing. CV: Regular rate and rhythm. No murmur, rub, or gallop. No JVD, no dependent edema. GI: Abdomen soft, non-tender, non-distended, with normoactive bowel sounds.  Ext: Warm, no deformities Skin: No rashes, lesions or ulcers on visualized skin. Neuro: Alert and oriented. No focal neurological deficits. Psych: Judgement and insight appear fair. Mood euthymic & affect congruent. Behavior is appropriate.    Data Reviewed: I have personally reviewed following labs and imaging studies  CBC: Recent Labs  Lab 10/15/18 2053 10/16/18 0610 10/17/18 0930 10/19/18 0147  WBC 5.0 5.4 13.7* 15.7*  NEUTROABS 3.5 4.7 12.4* 13.9*  HGB 11.3* 12.0* 11.3* 11.3*  HCT 31.0* 34.6* 32.6* 32.4*  MCV 87.6 87.2 87.4 85.9  PLT 127* 159 210 [id number]   Basic Metabolic Panel: Recent Labs  Lab 10/15/18 2053 10/16/18 0610 10/17/18 0930 10/19/18 0147  NA 134* 135 134* 137  K 4.3 3.6 3.6 4.0  CL 103 100 102 101  CO2 21* 24 21* 27  GLUCOSE 141* 219* 363* 129*  BUN 15 10 21 19   CREATININE 0.85 0.79 0.81 0.61  CALCIUM 8.0* 8.3* 8.2* 8.7*   Liver Function Tests: Recent Labs  Lab 10/15/18 2053 10/16/18 0610 10/17/18 0930 10/19/18 0147  AST 48* 37 41 31  ALT 26 26 31 28   ALKPHOS 39 43 46 51  BILITOT 1.2 0.6 0.6 0.5  PROT 6.3* 7.0 6.3* 6.1*  ALBUMIN 2.9* 3.3* 3.0* 3.0*   Cardiac Enzymes: Recent Labs  Lab 10/16/18  0610  CKTOTAL 131  CKMB 3.5   CBG: Recent Labs  Lab 10/19/18 1259 10/19/18 1646 10/19/18 1716 10/19/18 2053 10/20/18 0812  GLUCAP 401* 417* 406* 326* 229*   Urine analysis:    Component Value Date/Time   COLORURINE AMBER (A) 10/15/2018 1730   APPEARANCEUR HAZY (A) 10/15/2018 1730   LABSPEC 1.019 10/15/2018 1730   PHURINE 5.0 10/15/2018 1730   GLUCOSEU NEGATIVE 10/15/2018 1730   HGBUR NEGATIVE 10/15/2018 1730   HGBUR small 11/18/2009 0910   BILIRUBINUR NEGATIVE 10/15/2018 1730   BILIRUBINUR neg 10/25/2015 1141   KETONESUR NEGATIVE 10/15/2018 1730   PROTEINUR 30 (A) 10/15/2018 1730   UROBILINOGEN 0.2 10/25/2015 1141   UROBILINOGEN negative 11/18/2009 0910   NITRITE NEGATIVE 10/15/2018 1730   LEUKOCYTESUR NEGATIVE 10/15/2018 1730   Recent Results (from the past 240 hour(s))  SARS Coronavirus 2 [facility] order, Performed in [facility] hospital lab) Nasopharyngeal Nasopharyngeal Swab     Status: Abnormal   Collection Time: 10/15/18  5:10 PM   Specimen: Nasopharyngeal Swab  Result Value Ref Range Status   SARS Coronavirus 2 POSITIVE (A) NEGATIVE Final  Comment: RESULT CALLED TO, READ BACK BY AND VERIFIED WITH: Marzella Schlein RN 1910 10/15/18 A BROWNING (NOTE) If result is NEGATIVE SARS-CoV-2 target nucleic acids are NOT DETECTED. The SARS-CoV-2 RNA is generally detectable in upper and lower  respiratory specimens during the acute phase of infection. The lowest  concentration of SARS-CoV-2 viral copies this assay can detect is 250  copies / mL. A negative result does not preclude SARS-CoV-2 infection  and should not be used as the sole basis for treatment or other  patient management decisions.  A negative result may occur with  improper specimen collection / handling, submission of specimen other  than nasopharyngeal swab, presence of viral mutation(s) within the  areas targeted by this assay, and inadequate number of viral copies  (<250 copies / mL). A negative result  must be combined with clinical  observations, patient history, and epidemiological information. If result is POSITIVE SARS-CoV-2 target nucleic acids are DETECTED. T he SARS-CoV-2 RNA is generally detectable in upper and lower  respiratory specimens during the acute phase of infection.  Positive  results are indicative of active infection with SARS-CoV-2.  Clinical  correlation with patient history and other diagnostic information is  necessary to determine patient infection status.  Positive results do  not rule out bacterial infection or co-infection with other viruses. If result is PRESUMPTIVE POSTIVE SARS-CoV-2 nucleic acids MAY BE PRESENT.   A presumptive positive result was obtained on the submitted specimen  and confirmed on repeat testing.  While 2019 novel coronavirus  (SARS-CoV-2) nucleic acids may be present in the submitted sample  additional confirmatory testing may be necessary for epidemiological  and / or clinical management purposes  to differentiate between  SARS-CoV-2 and other Sarbecovirus currently known to infect humans.  If clinically indicated additional testing with an alternate test  methodology [phone]) is  advised. The SARS-CoV-2 RNA is generally  detectable in upper and lower respiratory specimens during the acute  phase of infection. The expected result is Negative. Fact Sheet for Patients:  StrictlyIdeas.no Fact Sheet for Healthcare Providers: BankingDealers.co.za This test is not yet approved or cleared by the [place] FDA and has been authorized for detection and/or diagnosis of SARS-CoV-2 by FDA under an Emergency Use Authorization (EUA).  This EUA will remain in effect (meaning this test can be used) for the duration of the COVID-19 declaration under Section 564(b)(1) of the Act, 21 U.S.C. section 360bbb-3(b)(1), unless the authorization is terminated or revoked sooner. Performed at [facility], [place] [address] [place], [place] [address]   Blood Culture (routine x 2)     Status: Abnormal   Collection Time: 10/15/18  8:53 PM   Specimen: BLOOD  Result Value Ref Range Status   Specimen Description BLOOD RIGHT WRIST  Final   Special Requests   Final    BOTTLES DRAWN AEROBIC AND ANAEROBIC Blood Culture adequate volume   Culture  Setup Time   Final    GRAM POSITIVE RODS AEROBIC BOTTLE ONLY CRITICAL RESULT CALLED TO, READ BACK BY AND VERIFIED WITH: H.ZOMPA PHARMD, AT 1008 10/18/18 BY D. VANHOOK    Culture (A)  Final    DIPHTHEROIDS(CORYNEBACTERIUM SPECIES) Standardized susceptibility testing for this organism is not available. Performed at [facility], [place] [address], [place], [place] [address]    Report Status 10/19/2018 FINAL  Final  Blood Culture (routine x 2)     Status: None   Collection Time: 10/15/18  9:18 PM   Specimen: BLOOD  Result Value Ref Range Status   Specimen Description BLOOD LEFT WRIST  Final   Special Requests   Final    BOTTLES DRAWN AEROBIC AND ANAEROBIC Blood Culture adequate volume   Culture   Final    NO GROWTH 5 DAYS Performed at [facility], [address] [place], [place] [address]    Report Status 10/20/2018 FINAL  Final      Radiology Studies: No results found.  Scheduled Meds: . amLODipine  5 mg Oral Daily  . aspirin EC  81 mg Oral Daily  . atorvastatin  20 mg Oral QPM  . carvedilol  12.5 mg Oral BID WC  . dexamethasone (DECADRON) injection  6 mg Intravenous Q24H  . diphenhydrAMINE  50 mg Oral Once  . enoxaparin (LOVENOX) injection  40 mg Subcutaneous Q24H  . influenza vaccine adjuvanted  0.5 mL Intramuscular Tomorrow-1000  . insulin aspart  0-20 Units Subcutaneous TID WC  . insulin aspart  0-5 Units Subcutaneous QHS  . insulin aspart  8 Units Subcutaneous TID WC  . insulin glargine  20 Units Subcutaneous BID  . mouth rinse  15 mL Mouth Rinse BID  . methylPREDNISolone (SOLU-MEDROL) injection  60 mg Intravenous  Q6H  . sodium chloride flush  3 mL Intravenous Once   Continuous Infusions: . remdesivir 100 mg in NS 250 mL Stopped (10/19/18 1023)     LOS: 5 days   Time spent: 35 minutes.  Patrecia Pour, MD [facility] [email or web address] Password [email or web address] 10/20/2018, 11:05 AM

## 2018-10-20 NOTE — Progress Notes (Signed)
Patient currently on 4L of o2 via nasal cannula. No s/s of pain or distress. Spoke to son with update. Will continue to monitor for remainder of shift.

## 2018-10-20 NOTE — Progress Notes (Signed)
Physical Therapy Treatment Patient Details Name: Eddie Mcdaniel MRN: [id number] DOB: [birth year] Today's Date: 10/20/2018    History of Present Illness 78 y.o. male,  w hypertension, hyperlipidemia, Dm2, CAD, rt rotator cuff repair x2 apparently presents with c/o feeling sick, fever last week, and has had poor po intake and poor energy,  and odd bitter taste in his mouth. Pt denies any travel or covid exposure. Pt admitted 10/15/18 for acute respiratory failure with hypoxia secondary to Covid -19, CAP; +lt pleural effusion    PT Comments    Patient recently returned from CT chest. Patient agreed to OOB and work with PT, however as session progressed, pt noted to be sleepy with frequent eye closing and stating he just wanted to lie back down. Stated he had meds to relax him prior to CT. During session incr from 6L to 8L HFNC during OOB to chair due to decr to 86% upon sitting EOB. Several minutes to recover to 90% and dropped to 84% with stand-pivot. Began to recover and up to 88% when pt asking to drink something. Sats dropped to 83% (still on 8L). After return to bed, pt in semi-fowlers position on 6L with sats 95%.     Follow Up Recommendations  No PT follow up(will continue to monitor needs)     Equipment Recommendations  None recommended by PT    Recommendations for Other Services       Precautions / Restrictions Precautions Precautions: Fall;Other (comment) Precaution Comments: monitor O2 Restrictions Weight Bearing Restrictions: No    Mobility  Bed Mobility Overal bed mobility: Needs Assistance Bed Mobility: Supine to Sit;Sit to Supine     Supine to sit: Min assist Sit to supine: Supervision   General bed mobility comments: sleepy and needed assist to get OOB  Transfers Overall transfer level: Needs assistance   Transfers: Sit to/from Stand;Stand Pivot Transfers Sit to Stand: Min guard Stand pivot transfers: Min assist       General transfer comment:  supervision for safety; min assist to steady as pivoting;   Ambulation/Gait             General Gait Details: pt reporting he feels too sleepy to walk safely   Stairs             Wheelchair Mobility    Modified Rankin (Stroke Patients Only)       Balance Overall balance assessment: Needs assistance Sitting-balance support: Feet supported;No upper extremity supported Sitting balance-Leahy Scale: Good     Standing balance support: No upper extremity supported Standing balance-Leahy Scale: Poor Standing balance comment: sleepy from meds; sways                             Cognition Arousal/Alertness: Lethargic;Suspect due to medications(had benadryl prior to CT chest) Behavior During Therapy: Flat affect Overall Cognitive Status: No family/caregiver present to determine baseline cognitive functioning                                 General Comments: delayed processing--? due to meds, lethargy      Exercises General Exercises - Lower Extremity Ankle Circles/Pumps: Both;20 reps Long Arc Quad: AROM;Both;10 reps Heel Slides: Both;5 reps(sats began to drop with incr RR)    General Comments        Pertinent Vitals/Pain Pain Assessment: No/denies pain    Home Living  Prior Function            PT Goals (current goals can now be found in the care plan section) Acute Rehab PT Goals Patient Stated Goal: get better and return home Time For Goal Achievement: 10/31/18 Potential to Achieve Goals: Good Progress towards PT goals: Not progressing toward goals - comment(lethargic s/p sedation for CT)    Frequency    Min 3X/week      PT Plan Current plan remains appropriate    Co-evaluation              AM-PAC PT "6 Clicks" Mobility   Outcome Measure  Help needed turning from your back to your side while in a flat bed without using bedrails?: None Help needed moving from lying on your back to  sitting on the side of a flat bed without using bedrails?: A Little Help needed moving to and from a bed to a chair (including a wheelchair)?: A Little Help needed standing up from a chair using your arms (e.g., wheelchair or bedside chair)?: A Little Help needed to walk in hospital room?: A Little Help needed climbing 3-5 steps with a railing? : Total 6 Click Score: 17    End of Session Equipment Utilized During Treatment: Oxygen Activity Tolerance: Patient limited by lethargy Patient left: with call bell/phone within reach;in bed(did not appear safe to leave in chair due to sleepiness) Nurse Communication: Mobility status;Other (comment)(decreased sats) PT Visit Diagnosis: Unsteadiness on feet (R26.81);Difficulty in walking, not elsewhere classified (R26.2)     Time: [id number] PT Time Calculation (min) (ACUTE ONLY): 31 min  Charges:  $Therapeutic Exercise: 8-22 mins $Therapeutic Activity: 8-22 mins                       Barry Brunner, PT       Rexanne Mano 10/20/2018, 5:55 PM

## 2018-10-21 LAB — COMPREHENSIVE METABOLIC PANEL
ALT: 23 U/L (ref 0–44)
AST: 22 U/L (ref 15–41)
Albumin: 2.6 g/dL — ABNORMAL LOW (ref 3.5–5.0)
Alkaline Phosphatase: 53 U/L (ref 38–126)
Anion gap: 9 (ref 5–15)
BUN: 27 mg/dL — ABNORMAL HIGH (ref 8–23)
CO2: 24 mmol/L (ref 22–32)
Calcium: 8.2 mg/dL — ABNORMAL LOW (ref 8.9–10.3)
Chloride: 99 mmol/L (ref 98–111)
Creatinine, Ser: 0.7 mg/dL (ref 0.61–1.24)
GFR calc Af Amer: >60 mL/min (ref >60)
GFR calc non Af Amer: >60 mL/min (ref >60)
Glucose, Bld: 283 mg/dL — ABNORMAL HIGH (ref 70–99)
Potassium: 4.1 mmol/L (ref 3.5–5.1)
Sodium: 132 mmol/L — ABNORMAL LOW (ref 135–145)
Total Bilirubin: 0.6 mg/dL (ref 0.3–1.2)
Total Protein: 5.7 g/dL — ABNORMAL LOW (ref 6.5–8.1)

## 2018-10-21 LAB — C-REACTIVE PROTEIN: CRP: 0.9 mg/dL (ref <1.0)

## 2018-10-21 LAB — CBC WITH DIFFERENTIAL/PLATELET
Abs Immature Granulocytes: 0.46 10*3/uL — ABNORMAL HIGH (ref 0.00–0.07)
Basophils Absolute: 0.1 10*3/uL (ref 0.0–0.1)
Basophils Relative: 0 %
Eosinophils Absolute: 0 10*3/uL (ref 0.0–0.5)
Eosinophils Relative: 0 %
HCT: 31.6 % — ABNORMAL LOW (ref 39.0–52.0)
Hemoglobin: 11 g/dL — ABNORMAL LOW (ref 13.0–17.0)
Immature Granulocytes: 3 %
Lymphocytes Relative: 4 %
Lymphs Abs: 0.6 10*3/uL — ABNORMAL LOW (ref 0.7–4.0)
MCH: 29.9 pg (ref 26.0–34.0)
MCHC: 34.8 g/dL (ref 30.0–36.0)
MCV: 85.9 fL (ref 80.0–100.0)
Monocytes Absolute: 0.4 10*3/uL (ref 0.1–1.0)
Monocytes Relative: 3 %
Neutro Abs: 13.6 10*3/uL — ABNORMAL HIGH (ref 1.7–7.7)
Neutrophils Relative %: 90 %
Platelets: 278 10*3/uL (ref 150–400)
RBC: 3.68 MIL/uL — ABNORMAL LOW (ref 4.22–5.81)
RDW: 12.4 % (ref 11.5–15.5)
WBC: 15.1 10*3/uL — ABNORMAL HIGH (ref 4.0–10.5)
nRBC: 0 % (ref 0.0–0.2)

## 2018-10-21 LAB — GLUCOSE, CAPILLARY
Glucose-Capillary: 211 mg/dL — ABNORMAL HIGH (ref 70–99)
Glucose-Capillary: 348 mg/dL — ABNORMAL HIGH (ref 70–99)
Glucose-Capillary: 420 mg/dL — ABNORMAL HIGH (ref 70–99)
Glucose-Capillary: 339 mg/dL — ABNORMAL HIGH (ref 70–99)

## 2018-10-21 MED ORDER — INSULIN GLARGINE 100 UNIT/ML ~~LOC~~ SOLN
25.0000 [IU] | Freq: Two times a day (BID) | SUBCUTANEOUS | Status: DC
  Administered 2018-10-21: 25 [IU] via SUBCUTANEOUS
  Filled 2018-10-21 (×2): qty 0.25

## 2018-10-21 MED ORDER — INSULIN ASPART 100 UNIT/ML ~~LOC~~ SOLN
15.0000 [IU] | Freq: Three times a day (TID) | SUBCUTANEOUS | Status: DC
  Administered 2018-10-21: 15 [IU] via SUBCUTANEOUS

## 2018-10-21 MED ORDER — INSULIN ASPART 100 UNIT/ML ~~LOC~~ SOLN
12.0000 [IU] | Freq: Three times a day (TID) | SUBCUTANEOUS | Status: DC
  Administered 2018-10-21 (×2): 12 [IU] via SUBCUTANEOUS

## 2018-10-21 MED ORDER — INSULIN GLARGINE 100 UNIT/ML ~~LOC~~ SOLN
30.0000 [IU] | Freq: Two times a day (BID) | SUBCUTANEOUS | Status: DC
  Administered 2018-10-21: 30 [IU] via SUBCUTANEOUS
  Filled 2018-10-21 (×2): qty 0.3

## 2018-10-21 NOTE — Progress Notes (Signed)
OT Cancellation Note  Patient Details Name: Eddie Mcdaniel MRN: [id number] DOB: 07/15/1940   Cancelled Treatment:    Reason Eval/Treat Not Completed: Other (comment);Fatigue/lethargy limiting ability to participate. Pt presents supine in bed, reporting he has been sleeping off/on for the majority of the day, reports too tired/weak to attempt working with OT (encouraged option of EOB/bed level activity as well). Encouraged/educated again re: OOB to recliner at least for meals daily. Will follow up for OT treatment at another time.  Lou Cal, OT [facility] Pager [phone] Office [phone]    Raymondo Band 10/21/2018, 3:36 PM

## 2018-10-21 NOTE — Progress Notes (Signed)
PROGRESS NOTE  Eddie Mcdaniel  [name] DOB: 1940/08/03 DOA: 10/15/2018 PCP: Ann Held, DO   Brief Narrative: "Eddie Mcdaniel" Eddie Mcdaniel is a 78 y.o. male with a history of CAD s/p DES to LAD, NIDT2DM, HTN, HLD, and GERD who presented to the ED 9/16 with progressive fatigue since 9/7 associated with diffuse weakness, poor per oral intake and intermittent fevers. In the ED he was hypoxic on room air with CXR demonstrating patchy airspace opacities without consolidation and possible left pleural effusion. CRP elevated at 9.1, PCT undetectable. Hypoxia progressed on 9/17 though CXR was stable. Ultimately given convalescent plasma 9/19. CT chest performed 9/21 shows typical GGO's without other complicating factors, though the patient remains with HFNC requirement.  Assessment & Plan: Principal Problem:   COVID-19 virus infection Active Problems:   Uncontrolled type 2 diabetes mellitus without complication, without long-term current use of insulin (HCC)   Hyperlipidemia LDL goal <70   Essential hypertension   Coronary artery disease involving native coronary artery of native heart without angina pectoris   Acute respiratory failure with hypoxia (HCC)   Abnormal liver function   Hyponatremia  Acute hypoxic respiratory failure due to covid-19 pneumonia: High risk of progression to ARDS based on age, comorbidities, severely elevated inflammatory markers and CXR infiltrates. CT chest 9/21 with typical GGO's.  - Completed remdesivir x5 days (9/17 - 9/21) - Continue steroids x10 days (9/17 - 9/26) - Received convalescent plasma 9/19. - Continue supplemental oxygen as needed to maintain SpO2 >88%. continues to require HFNC. Urged to prone as much as possible. Get OOB, up w/assist and continue regular PT. - Continue airborne, contact precautions. PPE including surgical gown, gloves, cap, shoe covers, and CAPR used during this encounter in a negative pressure room.  - Check intermittent labs.  Inflammatory marker showing sustained improvement (CRP 9.1 >> 0.9) - Maintain euvolemia. Not overloaded on CXR, exam, no CT chest.    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  - CT chest reveals no masses. Deviation is due to aortic arch/branchiocephalic vessels.   Left pleural effusion: Resolved.  Elevated d-dimer: Has normalized and is actually below age-adjusted cut-off.  - Continue prophylactic dose lovenox.   AST elevation: Mild, consistent with covid-19 infection. RUQ U/S normal, LFTs otherwise reassuring. hepatitis panel negative. Normalized on 9/18 - Monitor LFTs - Ok to give statin  NIDT2DM: with steroid-induced hyperglycemia. With HbA1c 6.5% and elevations into 400's despite significant insulin, he is clearly very steroid-sensitive. - Hold metformin - Augment insulin at mealtime and basal, continue following.   CAD s/p DES to LAD: No anginal symptoms. No ischemic features on ECG. No significant arrhythmias noted on telemetry so this was discontinued.  - Continue home beta blocker, statin, ASA  1 out of 4 blood culture bottles growing diphtheroids: This is suggestive of contaminant and will not receive targeted treatment. D/w pharmacy.  HTN:  - Continue coreg and norvasc  HLD:  - Continue statin  DVT prophylaxis: Lovenox Code Status: Full Family Communication: None at bedside, speaking with son daily Disposition Plan: Uncertain, depending on improvement with hypoxia.   Consultants:   None  Procedures:   Covid convalescent plasma 9/19  Antimicrobials:  Remdesivir 9/17 - 9/21  Ceftriaxone, azithromycin 9/17   Subjective: Oxygen requirement back up overnight though the patient feels about the same. No worsened shortness of breath, remains severely weak. No chest pain.    Objective: Vitals:   10/21/18 [id number] 10/21/18 [id number] 10/21/18 0736 10/21/18 0750  BP:    134/73  Pulse:    73  Resp:    20  Temp: 98.1 F (36.7 C)   98.1 F  (36.7 C)  TempSrc: Oral   Oral  SpO2:  (!) 80% 92% 94%  Weight:      Height:        Intake/Output Summary (Last 24 hours) at 10/21/2018 1209 Last data filed at 10/21/2018 1000 Gross per 24 hour  Intake 960 ml  Output 875 ml  Net 85 ml   Filed Weights   10/16/18 0100 10/16/18 0309  Weight: 66.2 kg 63.4 kg   Gen: 78 y.o. male in no distress Pulm: Nonlabored breathing HFNC. Crackles stable diffusely. CV: Regular rate and rhythm. No murmur, rub, or gallop. No JVD, no dependent edema. GI: Abdomen soft, non-tender, non-distended, with normoactive bowel sounds.  Ext: Warm, no deformities Skin: No rashes, lesions or ulcers on visualized skin. Neuro: Alert and oriented. No focal neurological deficits. Psych: Judgement and insight appear fair. Mood euthymic & affect congruent. Behavior is appropriate.    Data Reviewed: I have personally reviewed following labs and imaging studies  CBC: Recent Labs  Lab 10/15/18 2053 10/16/18 0610 10/17/18 0930 10/19/18 0147 10/21/18 0310  WBC 5.0 5.4 13.7* 15.7* 15.1*  NEUTROABS 3.5 4.7 12.4* 13.9* 13.6*  HGB 11.3* 12.0* 11.3* 11.3* 11.0*  HCT 31.0* 34.6* 32.6* 32.4* 31.6*  MCV 87.6 87.2 87.4 85.9 85.9  PLT 127* 159 210 260 0000000   Basic Metabolic Panel: Recent Labs  Lab 10/15/18 2053 10/16/18 0610 10/17/18 0930 10/19/18 0147 10/21/18 0310  NA 134* 135 134* 137 132*  K 4.3 3.6 3.6 4.0 4.1  CL 103 100 102 101 99  CO2 21* 24 21* 27 24  GLUCOSE 141* 219* 363* 129* 283*  BUN 15 10 21 19  27*  CREATININE 0.85 0.79 0.81 0.61 0.70  CALCIUM 8.0* 8.3* 8.2* 8.7* 8.2*   Liver Function Tests: Recent Labs  Lab 10/15/18 2053 10/16/18 0610 10/17/18 0930 10/19/18 0147 10/21/18 0310  AST 48* 37 41 31 22  ALT 26 26 31 28 23   ALKPHOS 39 43 46 51 53  BILITOT 1.2 0.6 0.6 0.5 0.6  PROT 6.3* 7.0 6.3* 6.1* 5.7*  ALBUMIN 2.9* 3.3* 3.0* 3.0* 2.6*   Cardiac Enzymes: Recent Labs  Lab 10/16/18 0610  CKTOTAL 131  CKMB 3.5   CBG: Recent Labs    Lab 10/20/18 0812 10/20/18 1135 10/20/18 1552 10/20/18 2131 10/21/18 0745  GLUCAP 229* 321* 338* 396* 211*   Urine analysis:    Component Value Date/Time   COLORURINE AMBER (A) 10/15/2018 1730   APPEARANCEUR HAZY (A) 10/15/2018 1730   LABSPEC 1.019 10/15/2018 1730   PHURINE 5.0 10/15/2018 1730   GLUCOSEU NEGATIVE 10/15/2018 1730   HGBUR NEGATIVE 10/15/2018 1730   HGBUR small 11/18/2009 0910   BILIRUBINUR NEGATIVE 10/15/2018 1730   BILIRUBINUR neg 10/25/2015 1141   KETONESUR NEGATIVE 10/15/2018 1730   PROTEINUR 30 (A) 10/15/2018 1730   UROBILINOGEN 0.2 10/25/2015 1141   UROBILINOGEN negative 11/18/2009 0910   NITRITE NEGATIVE 10/15/2018 1730   LEUKOCYTESUR NEGATIVE 10/15/2018 1730   Recent Results (from the past 240 hour(s))  SARS Coronavirus 2 [facility] order, Performed in [facility] hospital lab) Nasopharyngeal Nasopharyngeal Swab     Status: Abnormal   Collection Time: 10/15/18  5:10 PM   Specimen: Nasopharyngeal Swab  Result Value Ref Range Status   SARS Coronavirus 2 POSITIVE (A) NEGATIVE Final    Comment: RESULT CALLED TO, READ BACK  BY AND VERIFIED WITH: Marzella Schlein RN 1910 10/15/18 A BROWNING (NOTE) If result is NEGATIVE SARS-CoV-2 target nucleic acids are NOT DETECTED. The SARS-CoV-2 RNA is generally detectable in upper and lower  respiratory specimens during the acute phase of infection. The lowest  concentration of SARS-CoV-2 viral copies this assay can detect is 250  copies / mL. A negative result does not preclude SARS-CoV-2 infection  and should not be used as the sole basis for treatment or other  patient management decisions.  A negative result may occur with  improper specimen collection / handling, submission of specimen other  than nasopharyngeal swab, presence of viral mutation(s) within the  areas targeted by this assay, and inadequate number of viral copies  (<250 copies / mL). A negative result must be combined with clinical  observations, patient  history, and epidemiological information. If result is POSITIVE SARS-CoV-2 target nucleic acids are DETECTED. T he SARS-CoV-2 RNA is generally detectable in upper and lower  respiratory specimens during the acute phase of infection.  Positive  results are indicative of active infection with SARS-CoV-2.  Clinical  correlation with patient history and other diagnostic information is  necessary to determine patient infection status.  Positive results do  not rule out bacterial infection or co-infection with other viruses. If result is PRESUMPTIVE POSTIVE SARS-CoV-2 nucleic acids MAY BE PRESENT.   A presumptive positive result was obtained on the submitted specimen  and confirmed on repeat testing.  While 2019 novel coronavirus  (SARS-CoV-2) nucleic acids may be present in the submitted sample  additional confirmatory testing may be necessary for epidemiological  and / or clinical management purposes  to differentiate between  SARS-CoV-2 and other Sarbecovirus currently known to infect humans.  If clinically indicated additional testing with an alternate test  methodology [phone]) is  advised. The SARS-CoV-2 RNA is generally  detectable in upper and lower respiratory specimens during the acute  phase of infection. The expected result is Negative. Fact Sheet for Patients:  StrictlyIdeas.no Fact Sheet for Healthcare Providers: BankingDealers.co.za This test is not yet approved or cleared by the [place] FDA and has been authorized for detection and/or diagnosis of SARS-CoV-2 by FDA under an Emergency Use Authorization (EUA).  This EUA will remain in effect (meaning this test can be used) for the duration of the COVID-19 declaration under Section 564(b)(1) of the Act, 21 U.S.C. section 360bbb-3(b)(1), unless the authorization is terminated or revoked sooner. Performed at [facility], [place] [address], [place], [place] [address]     Blood Culture (routine x 2)     Status: Abnormal   Collection Time: 10/15/18  8:53 PM   Specimen: BLOOD  Result Value Ref Range Status   Specimen Description BLOOD RIGHT WRIST  Final   Special Requests   Final    BOTTLES DRAWN AEROBIC AND ANAEROBIC Blood Culture adequate volume   Culture  Setup Time   Final    GRAM POSITIVE RODS AEROBIC BOTTLE ONLY CRITICAL RESULT CALLED TO, READ BACK BY AND VERIFIED WITH: H.ZOMPA PHARMD, AT 1008 10/18/18 BY D. VANHOOK    Culture (A)  Final    DIPHTHEROIDS(CORYNEBACTERIUM SPECIES) Standardized susceptibility testing for this organism is not available. Performed at [facility], [place] [address], [place],  [address]    Report Status 10/19/2018 FINAL  Final  Blood Culture (routine x 2)     Status: None   Collection Time: 10/15/18  9:18 PM   Specimen: BLOOD  Result Value Ref Range  Status   Specimen Description BLOOD LEFT WRIST  Final   Special Requests   Final    BOTTLES DRAWN AEROBIC AND ANAEROBIC Blood Culture adequate volume   Culture   Final    NO GROWTH 5 DAYS Performed at [facility], [address], [place], [place] [address]    Report Status 10/20/2018 FINAL  Final      Radiology Studies: Ct Chest W Contrast  Result Date: 10/20/2018 CLINICAL DATA:  Acute respiratory distress secondary to COVID-19. Tracheal deviation noted on prior chest x-ray. EXAM: CT CHEST WITH CONTRAST TECHNIQUE: Multidetector CT imaging of the chest was performed during intravenous contrast administration. CONTRAST:  34mL OMNIPAQUE IOHEXOL 300 MG/ML  SOLN COMPARISON:  Chest x-ray dated 10/17/2018 FINDINGS: Cardiovascular: Aortic atherosclerosis. Heart size is normal. No pericardial effusion. Coronary artery calcifications. 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 This is due to the aortic arch and brachiocephalic vessels. There is no mass or adenopathy. Trachea otherwise appears normal. The thyroid gland  is normal. Small hiatal hernia. Lungs/Pleura: The patient has extensive bilateral peripheral hazy infiltrates, most severe in the lower lobes consistent with viral pneumonia. No pleural effusions. Upper Abdomen: No acute abnormality. Musculoskeletal: No chest wall abnormality. Moderate arthritic changes of both shoulders with chondrocalcinosis. IMPRESSION: 1. Extensive bilateral peripheral hazy infiltrates, most severe in the lower lobes consistent with viral pneumonia. 2. 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 No masses or adenopathy. Aortic Atherosclerosis (ICD10-I70.0). Electronically Signed   By: Lorriane Shire M.D.   On: 10/20/2018 16:04    Scheduled Meds:  amLODipine  5 mg Oral Daily   aspirin EC  81 mg Oral Daily   atorvastatin  20 mg Oral QPM   carvedilol  12.5 mg Oral BID WC   dexamethasone (DECADRON) injection  6 mg Intravenous Q24H   enoxaparin (LOVENOX) injection  40 mg Subcutaneous Q24H   influenza vaccine adjuvanted  0.5 mL Intramuscular Tomorrow-1000   insulin aspart  0-20 Units Subcutaneous TID WC   insulin aspart  0-5 Units Subcutaneous QHS   insulin aspart  12 Units Subcutaneous TID WC   insulin glargine  25 Units Subcutaneous BID   mouth rinse  15 mL Mouth Rinse BID   sodium chloride flush  3 mL Intravenous Once   Continuous Infusions:    LOS: 6 days   Time spent: 35 minutes.  Patrecia Pour, MD [facility] [email or web address] Password [facility] 10/21/2018, 12:09 PM

## 2018-10-21 NOTE — Plan of Care (Signed)
  Problem: Education: Goal: Knowledge of risk factors and measures for prevention of condition will improve Outcome: Progressing   Problem: Respiratory: Goal: Will maintain a patent airway Outcome: Progressing Goal: Complications related to the disease process, condition or treatment will be avoided or minimized Outcome: Progressing   Problem: Education: Goal: Knowledge of General Education information will improve Description: Including pain rating scale, medication(s)/side effects and non-pharmacologic comfort measures Outcome: Progressing   Problem: Health Behavior/Discharge Planning: Goal: Ability to manage health-related needs will improve Outcome: Progressing   Problem: Clinical Measurements: Goal: Ability to maintain clinical measurements within normal limits will improve Outcome: Progressing Goal: Will remain free from infection Outcome: Progressing Goal: Diagnostic test results will improve Outcome: Progressing Goal: Respiratory complications will improve Outcome: Progressing Goal: Cardiovascular complication will be avoided Outcome: Progressing   Problem: Activity: Goal: Risk for activity intolerance will decrease Outcome: Progressing   Problem: Nutrition: Goal: Adequate nutrition will be maintained Outcome: Progressing   Problem: Coping: Goal: Level of anxiety will decrease Outcome: Progressing   Problem: Elimination: Goal: Will not experience complications related to bowel motility Outcome: Progressing Goal: Will not experience complications related to urinary retention Outcome: Progressing   Problem: Pain Managment: Goal: General experience of comfort will improve Outcome: Progressing   Problem: Safety: Goal: Ability to remain free from injury will improve Outcome: Progressing   Problem: Skin Integrity: Goal: Risk for impaired skin integrity will decrease Outcome: Progressing   

## 2018-10-22 DIAGNOSIS — I2510 Atherosclerotic heart disease of native coronary artery without angina pectoris: Secondary | ICD-10-CM

## 2018-10-22 LAB — GLUCOSE, CAPILLARY
Glucose-Capillary: 130 mg/dL — ABNORMAL HIGH (ref 70–99)
Glucose-Capillary: 368 mg/dL — ABNORMAL HIGH (ref 70–99)
Glucose-Capillary: 387 mg/dL — ABNORMAL HIGH (ref 70–99)
Glucose-Capillary: 367 mg/dL — ABNORMAL HIGH (ref 70–99)

## 2018-10-22 MED ORDER — INSULIN GLARGINE 100 UNIT/ML ~~LOC~~ SOLN
34.0000 [IU] | Freq: Two times a day (BID) | SUBCUTANEOUS | Status: DC
  Administered 2018-10-22 – 2018-10-23 (×4): 34 [IU] via SUBCUTANEOUS
  Filled 2018-10-22 (×5): qty 0.34

## 2018-10-22 MED ORDER — INSULIN ASPART 100 UNIT/ML ~~LOC~~ SOLN
17.0000 [IU] | Freq: Three times a day (TID) | SUBCUTANEOUS | Status: DC
  Administered 2018-10-22 – 2018-10-27 (×16): 17 [IU] via SUBCUTANEOUS

## 2018-10-22 MED ORDER — OXYMETAZOLINE HCL 0.05 % NA SOLN
1.0000 | Freq: Two times a day (BID) | NASAL | Status: DC
  Administered 2018-10-22 – 2018-11-03 (×25): 1 via NASAL
  Filled 2018-10-22: qty 15

## 2018-10-22 NOTE — Progress Notes (Signed)
Physical Therapy Treatment Patient Details Name: ROM ALVARDO MRN: [id number] DOB: 1940-03-06 Today's Date: 10/22/2018    History of Present Illness 78 y.o. male,  w hypertension, hyperlipidemia, Dm2, CAD, rt rotator cuff repair x2 apparently presents with c/o feeling sick, fever last week, and has had poor po intake and poor energy,  and odd bitter taste in his mouth. Pt denies any travel or covid exposure. Pt admitted 10/15/18 for acute respiratory failure with hypoxia secondary to Covid -19, CAP; +lt pleural effusion    PT Comments    Patient on 10L HFNC on arrival. Sitting EOB and talking drops sats 92>87%. Requires cues to stop talking and attend to his breathing. Max cues to correctly do pursed lip breathing and to slow breaths. Pivot to [facility] and dropped to 83% with slow recovery. Ultimately ambulated 40 feet in room on 10L using pursed lip breathing and VERY slow pace to maintain sats >86% throughout walk. Patient demonstrating decr cognition (no worse) but needs repetition each day re: proper use and frequency of IS. Patient has declined in terms of activity tolerance since initial evaluation and now recommend HHPT to help him regain his independence.    Follow Up Recommendations  Home health PT;Supervision/Assistance - 24 hour(due to cognition and desaturating)     Equipment Recommendations  None recommended by PT    Recommendations for Other Services       Precautions / Restrictions Precautions Precautions: Fall;Other (comment) Precaution Comments: monitor O2 Restrictions Weight Bearing Restrictions: No    Mobility  Bed Mobility               General bed mobility comments: sitting at EOB on arrival  Transfers Overall transfer level: Needs assistance Equipment used: None Transfers: Sit to/from Stand;Stand Pivot Transfers Sit to Stand: Min guard Stand pivot transfers: Min guard       General transfer comment: for safety, lines, vc for  breathing  Ambulation/Gait Ambulation/Gait assistance: Min guard Gait Distance (Feet): 25 Feet(seated rest x 4 minutes to recover sats, 40 ft) Assistive device: (pt pushing O2 tank (he insisted)) Gait Pattern/deviations: Step-through pattern;Decreased stride length Gait velocity: extremely slow to try to keep his sats up; vc throughout for proper pursed lip breathing--sats responded well when he did it correctly       Stairs             Wheelchair Mobility    Modified Rankin (Stroke Patients Only)       Balance Overall balance assessment: Needs assistance Sitting-balance support: Feet supported;No upper extremity supported Sitting balance-Leahy Scale: Good     Standing balance support: No upper extremity supported Standing balance-Leahy Scale: Fair                              Cognition Arousal/Alertness: Awake/alert Behavior During Therapy: Flat affect Overall Cognitive Status: No family/caregiver present to determine baseline cognitive functioning                                 General Comments: Has been instructed on frequency of IS each visit and yet pt continues to report he should use it 5x/day. Instructed twice during session (once by PT, then by RN) and at end of session, again stated freq as 5x per day      Exercises Other Exercises Other Exercises: IS x 5 repsx 2 with fair technique. vc for  slower inhalation; Max inspired 750-1060ml    General Comments        Pertinent Vitals/Pain Pain Assessment: No/denies pain    Home Living                      Prior Function            PT Goals (current goals can now be found in the care plan section) Acute Rehab PT Goals Patient Stated Goal: get better and return home Time For Goal Achievement: 10/31/18 Potential to Achieve Goals: Good Progress towards PT goals: Progressing toward goals    Frequency    Min 3X/week      PT Plan Discharge plan needs to be  updated    Co-evaluation              AM-PAC PT "6 Clicks" Mobility   Outcome Measure  Help needed turning from your back to your side while in a flat bed without using bedrails?: None Help needed moving from lying on your back to sitting on the side of a flat bed without using bedrails?: A Little Help needed moving to and from a bed to a chair (including a wheelchair)?: A Little Help needed standing up from a chair using your arms (e.g., wheelchair or bedside chair)?: A Little Help needed to walk in hospital room?: A Little Help needed climbing 3-5 steps with a railing? : Total 6 Click Score: 17    End of Session Equipment Utilized During Treatment: Oxygen Activity Tolerance: Treatment limited secondary to medical complications (Comment)(desats) Patient left: with call bell/phone within reach;in chair(with MD)   PT Visit Diagnosis: Unsteadiness on feet (R26.81);Difficulty in walking, not elsewhere classified (R26.2)     Time: [id number] PT Time Calculation (min) (ACUTE ONLY): 50 min  Charges:  $Gait Training: 23-37 mins $Therapeutic Exercise: 8-22 mins                       Barry Brunner, PT       Rexanne Mano 10/22/2018, 12:33 PM

## 2018-10-22 NOTE — Progress Notes (Signed)
Family updated on plan of care  

## 2018-10-22 NOTE — Progress Notes (Signed)
PROGRESS NOTE    DUELL CIRAULO  [id number] DOB: 05-Apr-1940 DOA: 10/15/2018 PCP: Ann Held, DO      Brief Narrative:  Mr. Bullen is a 78 y.o. M with hx CAD s/p PCI x1 2017, HTN and DM who presented with 1 week fever, fatigue, malaise, and anosmia.  In the ER, CXR showed multifocal pneumonia, was 89% on room air.        Assessment & Plan:  Coronavirus pneumonitis with acute hypoxic respiratory failure In the setting of the ongoing 2020 COVID-19 pandemic.  Transfused convalescent plasma on 9/19 S/p remdesivir x5d  O2 req 5-15L in last 24 hours. -Continue steroids, day 8 -VTE PPx with Lovenox -Flutter valve, turn, cough, incentive spirometry q2hrs while awake -Nutrition consult    Hypertension Coronary disease secondary prevrention BP controlled -Continue amlodipine, carvedilol -Continue aspirin, atorvastatin  Diabetes Glucoses uncontrolled -Continue Lantus, increase dose -Continue mealtime insulin, increase dose -Continue SSI corrections -Hold metformin  Positive blood culture Diphtheroids in 1/4 blood cultures is likely contaminant.  He has been stable without fever off antibiotics     MDM and disposition: The below labs and imaging reports were reviewed and summarized above.  Medication management as above.  The patient was admitted with COVID-19.  He continues to desaturated to 70s with exertion, even despite supplemental O2.        DVT prophylaxis: Lovenox Code Status: FULL Family Communication: Will call wife by phone     Procedures:   CXR 9/18 -- bilateral pneumonia  CT chest 9/21 -- bilateral pneumonia, tracheal devation iwthout mass or hilar adenopathy  Antimicrobials:   Ceftriaxone x1  Azithromycin x1   Culture data:   9/16 blood culture 1/4 positive for diphtheroids       Subjective: Still has fatigue and nasal congestion.  No vomiting.  Appetite good.  No fever, chest pain.  Still cough.     Objective: Vitals:   10/21/18 1616 10/21/18 2020 10/22/18 0455 10/22/18 0747  BP: 136/61 (!) 142/63 (!) 141/70 (!) 142/75  Pulse: 84   64  Resp: 20 20 18 16   Temp: 97.7 F (36.5 C) 99.1 F (37.3 C) 98.2 F (36.8 C) 98.3 F (36.8 C)  TempSrc: Oral Oral Oral Oral  SpO2: 94% 96%  93%  Weight:      Height:        Intake/Output Summary (Last 24 hours) at 10/22/2018 1206 Last data filed at 10/22/2018 0459 Gross per 24 hour  Intake 400 ml  Output 1400 ml  Net -1000 ml   Filed Weights   10/16/18 0100 10/16/18 0309  Weight: 66.2 kg 63.4 kg    Examination: General appearance: overweight adult male, alert and in no acute distress.  Sitting in recliner HEENT: Anicteric, conjunctiva pink, lids and lashes normal. No nasal deformity, discharge, epistaxis.  Lips moist, dentition normal, oropharynx moist, no oral lesions, hearing normal.   Skin: Warm and dry.   No suspicious rashes or lesions on face, neck, arms, legs. Cardiac: RRR, nl S1-S2, no murmurs appreciated.  Capillary refill is brisk.  JVP normal.  No LE edema.  Radial pulses 2+ and symmetric. Respiratory: Respirations labored with exertion.  Comfortable at rest.  No wheezing or rales. Abdomen: Abdomen soft.  No TTP or guarding. No ascites, distension, hepatosplenomegaly.   MSK: No deformities or effusions. Neuro: Awake and alert.  EOMI, moves all extremities. Speech fluent.    Psych: Sensorium intact and responding to questions, attention normal. Affect normal.  Judgment and insight appear normal.       Data Reviewed: I have personally reviewed following labs and imaging studies:  CBC: Recent Labs  Lab 10/15/18 2053 10/16/18 0610 10/17/18 0930 10/19/18 0147 10/21/18 0310  WBC 5.0 5.4 13.7* 15.7* 15.1*  NEUTROABS 3.5 4.7 12.4* 13.9* 13.6*  HGB 11.3* 12.0* 11.3* 11.3* 11.0*  HCT 31.0* 34.6* 32.6* 32.4* 31.6*  MCV 87.6 87.2 87.4 85.9 85.9  PLT 127* 159 210 260 0000000   Basic Metabolic Panel: Recent Labs  Lab  10/15/18 2053 10/16/18 0610 10/17/18 0930 10/19/18 0147 10/21/18 0310  NA 134* 135 134* 137 132*  K 4.3 3.6 3.6 4.0 4.1  CL 103 100 102 101 99  CO2 21* 24 21* 27 24  GLUCOSE 141* 219* 363* 129* 283*  BUN 15 10 21 19  27*  CREATININE 0.85 0.79 0.81 0.61 0.70  CALCIUM 8.0* 8.3* 8.2* 8.7* 8.2*   GFR: Estimated Creatinine Clearance: 68.3 mL/min (by C-G formula based on SCr of 0.7 mg/dL). Liver Function Tests: Recent Labs  Lab 10/15/18 2053 10/16/18 0610 10/17/18 0930 10/19/18 0147 10/21/18 0310  AST 48* 37 41 31 22  ALT 26 26 31 28 23   ALKPHOS 39 43 46 51 53  BILITOT 1.2 0.6 0.6 0.5 0.6  PROT 6.3* 7.0 6.3* 6.1* 5.7*  ALBUMIN 2.9* 3.3* 3.0* 3.0* 2.6*   No results for input(s): LIPASE, AMYLASE in the last 168 hours. No results for input(s): AMMONIA in the last 168 hours. Coagulation Profile: No results for input(s): INR, PROTIME in the last 168 hours. Cardiac Enzymes: Recent Labs  Lab 10/16/18 0610  CKTOTAL 131  CKMB 3.5   BNP (last 3 results) No results for input(s): PROBNP in the last 8760 hours. HbA1C: No results for input(s): HGBA1C in the last 72 hours. CBG: Recent Labs  Lab 10/21/18 0745 10/21/18 1236 10/21/18 1617 10/21/18 2110 10/22/18 0747  GLUCAP 211* 348* 420* 339* 130*   Lipid Profile: No results for input(s): CHOL, HDL, LDLCALC, TRIG, CHOLHDL, LDLDIRECT in the last 72 hours. Thyroid Function Tests: No results for input(s): TSH, T4TOTAL, FREET4, T3FREE, THYROIDAB in the last 72 hours. Anemia Panel: No results for input(s): VITAMINB12, FOLATE, FERRITIN, TIBC, IRON, RETICCTPCT in the last 72 hours. Urine analysis:    Component Value Date/Time   COLORURINE AMBER (A) 10/15/2018 1730   APPEARANCEUR HAZY (A) 10/15/2018 1730   LABSPEC 1.019 10/15/2018 1730   PHURINE 5.0 10/15/2018 1730   GLUCOSEU NEGATIVE 10/15/2018 1730   HGBUR NEGATIVE 10/15/2018 1730   HGBUR small 11/18/2009 0910   BILIRUBINUR NEGATIVE 10/15/2018 1730   BILIRUBINUR neg  10/25/2015 1141   KETONESUR NEGATIVE 10/15/2018 1730   PROTEINUR 30 (A) 10/15/2018 1730   UROBILINOGEN 0.2 10/25/2015 1141   UROBILINOGEN negative 11/18/2009 0910   NITRITE NEGATIVE 10/15/2018 1730   LEUKOCYTESUR NEGATIVE 10/15/2018 1730   Sepsis Labs: @LABRCNTIP (procalcitonin:4,lacticacidven:4)  ) Recent Results (from the past 240 hour(s))  SARS Coronavirus 2 [facility] order, Performed in [facility] hospital lab) Nasopharyngeal Nasopharyngeal Swab     Status: Abnormal   Collection Time: 10/15/18  5:10 PM   Specimen: Nasopharyngeal Swab  Result Value Ref Range Status   SARS Coronavirus 2 POSITIVE (A) NEGATIVE Final    Comment: RESULT CALLED TO, READ BACK BY AND VERIFIED WITH: Marzella Schlein RN 1910 10/15/18 A BROWNING (NOTE) If result is NEGATIVE SARS-CoV-2 target nucleic acids are NOT DETECTED. The SARS-CoV-2 RNA is generally detectable in upper and lower  respiratory specimens during the acute phase of infection.  The lowest  concentration of SARS-CoV-2 viral copies this assay can detect is 250  copies / mL. A negative result does not preclude SARS-CoV-2 infection  and should not be used as the sole basis for treatment or other  patient management decisions.  A negative result may occur with  improper specimen collection / handling, submission of specimen other  than nasopharyngeal swab, presence of viral mutation(s) within the  areas targeted by this assay, and inadequate number of viral copies  (<250 copies / mL). A negative result must be combined with clinical  observations, patient history, and epidemiological information. If result is POSITIVE SARS-CoV-2 target nucleic acids are DETECTED. T he SARS-CoV-2 RNA is generally detectable in upper and lower  respiratory specimens during the acute phase of infection.  Positive  results are indicative of active infection with SARS-CoV-2.  Clinical  correlation with patient history and other diagnostic information is  necessary to  determine patient infection status.  Positive results do  not rule out bacterial infection or co-infection with other viruses. If result is PRESUMPTIVE POSTIVE SARS-CoV-2 nucleic acids MAY BE PRESENT.   A presumptive positive result was obtained on the submitted specimen  and confirmed on repeat testing.  While 2019 novel coronavirus  (SARS-CoV-2) nucleic acids may be present in the submitted sample  additional confirmatory testing may be necessary for epidemiological  and / or clinical management purposes  to differentiate between  SARS-CoV-2 and other Sarbecovirus currently known to infect humans.  If clinically indicated additional testing with an alternate test  methodology [phone]) is  advised. The SARS-CoV-2 RNA is generally  detectable in upper and lower respiratory specimens during the acute  phase of infection. The expected result is Negative. Fact Sheet for Patients:  StrictlyIdeas.no Fact Sheet for Healthcare Providers: BankingDealers.co.za This test is not yet approved or cleared by the [place] FDA and has been authorized for detection and/or diagnosis of SARS-CoV-2 by FDA under an Emergency Use Authorization (EUA).  This EUA will remain in effect (meaning this test can be used) for the duration of the COVID-19 declaration under Section 564(b)(1) of the Act, 21 U.S.C. section 360bbb-3(b)(1), unless the authorization is terminated or revoked sooner. Performed at [facility], [place] [address] [place], [place] [address]   Blood Culture (routine x 2)     Status: Abnormal   Collection Time: 10/15/18  8:53 PM   Specimen: BLOOD  Result Value Ref Range Status   Specimen Description BLOOD RIGHT WRIST  Final   Special Requests   Final    BOTTLES DRAWN AEROBIC AND ANAEROBIC Blood Culture adequate volume   Culture  Setup Time   Final    GRAM POSITIVE RODS AEROBIC BOTTLE ONLY CRITICAL RESULT CALLED TO, READ BACK BY AND  VERIFIED WITH: H.ZOMPA PHARMD, AT 1008 10/18/18 BY D. VANHOOK    Culture (A)  Final    DIPHTHEROIDS(CORYNEBACTERIUM SPECIES) Standardized susceptibility testing for this organism is not available. Performed at [facility], [place] [address] [place], [place] [address]    Report Status 10/19/2018 FINAL  Final  Blood Culture (routine x 2)     Status: None   Collection Time: 10/15/18  9:18 PM   Specimen: BLOOD  Result Value Ref Range Status   Specimen Description BLOOD LEFT WRIST  Final   Special Requests   Final    BOTTLES DRAWN AEROBIC AND ANAEROBIC Blood Culture adequate volume   Culture   Final    NO GROWTH 5 DAYS Performed  at [facility], [place] [address], [place], [place] [address]    Report Status 10/20/2018 FINAL  Final         Radiology Studies: Ct Chest W Contrast  Result Date: 10/20/2018 CLINICAL DATA:  Acute respiratory distress secondary to COVID-19. Tracheal deviation noted on prior chest x-ray. EXAM: CT CHEST WITH CONTRAST TECHNIQUE: Multidetector CT imaging of the chest was performed during intravenous contrast administration. CONTRAST:  66mL OMNIPAQUE IOHEXOL 300 MG/ML  SOLN COMPARISON:  Chest x-ray dated 10/17/2018 FINDINGS: Cardiovascular: Aortic atherosclerosis. Heart size is normal. No pericardial effusion. Coronary artery calcifications. 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 This is due to the aortic arch and brachiocephalic vessels. There is no mass or adenopathy. Trachea otherwise appears normal. The thyroid gland is normal. Small hiatal hernia. Lungs/Pleura: The patient has extensive bilateral peripheral hazy infiltrates, most severe in the lower lobes consistent with viral pneumonia. No pleural effusions. Upper Abdomen: No acute abnormality. Musculoskeletal: No chest wall abnormality. Moderate arthritic changes of both shoulders with chondrocalcinosis. IMPRESSION: 1. Extensive bilateral peripheral hazy  infiltrates, most severe in the lower lobes consistent with viral pneumonia. 2. 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 No masses or adenopathy. Aortic Atherosclerosis (ICD10-I70.0). Electronically Signed   By: Lorriane Shire M.D.   On: 10/20/2018 16:04        Scheduled Meds:  amLODipine  5 mg Oral Daily   aspirin EC  81 mg Oral Daily   atorvastatin  20 mg Oral QPM   carvedilol  12.5 mg Oral BID WC   dexamethasone (DECADRON) injection  6 mg Intravenous Q24H   enoxaparin (LOVENOX) injection  40 mg Subcutaneous Q24H   influenza vaccine adjuvanted  0.5 mL Intramuscular Tomorrow-1000   insulin aspart  0-20 Units Subcutaneous TID WC   insulin aspart  0-5 Units Subcutaneous QHS   insulin aspart  17 Units Subcutaneous TID WC   insulin glargine  34 Units Subcutaneous BID   mouth rinse  15 mL Mouth Rinse BID   oxymetazoline  1 spray Each Nare BID   sodium chloride flush  3 mL Intravenous Once   Continuous Infusions:   LOS: 7 days    Time spent: 35 minutes      Edwin Dada, MD [facility] 10/22/2018, 12:06 PM     Please page through [place]:  [email or web address] Contact charge nurse for password If 7PM-7AM, please contact night-coverage

## 2018-10-22 NOTE — Progress Notes (Signed)
Initial Nutrition Assessment  DOCUMENTATION CODES:   Not applicable  INTERVENTION:    Ensure Max po once daily, each supplement provides 150 kcal and 30 grams of protein.    Pt receiving Hormel Shake daily with Breakfast which provides 520 kcals and 22 g of protein and Magic cup BID with lunch and dinner, each supplement provides 290 kcal and 9 grams of protein, automatically on meal trays to optimize nutritional intake.    Multivitamin with minerals daily.   Encourage intake of meals and supplements.  NUTRITION DIAGNOSIS:   Increased nutrient needs related to acute illness as evidenced by estimated needs.  GOAL:   Patient will meet greater than or equal to 90% of their needs  MONITOR:   PO intake, Supplement acceptance  REASON FOR ASSESSMENT:   Consult Assessment of nutrition requirement/status  ASSESSMENT:   78 yo male admitted with coronavirus pneumonitis. PMH includes CAD, HTN, DM.   Patient has been on a heart healthy diet up until 9/21 when diet was changed to Vegetarian. Since diet was changed, patient has consumed 100% of meals.    Labs 9/22 reviewed. Sodium 132 (L) Albumin 2.6 (L), reflects inflammatory process, not a good indicator of nutrition status. CBG's: [phone]  Medications reviewed and include decadron, novolog, lantus.  Per review of weight encounters, patient has lost 6.5% of usual weight within the past 9 months. No more recent PTA weights available for comparison.   NUTRITION - FOCUSED PHYSICAL EXAM:  deferred  Diet Order:   Diet Order            Diet vegetarian Room service appropriate? Yes; Fluid consistency: Thin  Diet effective now              EDUCATION NEEDS:   No education needs have been identified at this time  Skin:  Skin Assessment: Reviewed RN Assessment  Last BM:  9/22 type 4  Height:   Ht Readings from Last 1 Encounters:  10/16/18 5\' 5"  (1.651 m)    Weight:   Wt Readings from Last 1 Encounters:   10/16/18 63.4 kg    Ideal Body Weight:  61.8 kg  BMI:  Body mass index is 23.26 kg/m.  Estimated Nutritional Needs:   Kcal:  1800-2100  Protein:  80-100 gm  Fluid:  1.8 L    Molli Barrows, RD, LDN, CNSC Pager [phone] After Hours Pager [phone]

## 2018-10-23 LAB — CBC
HCT: 33.6 % — ABNORMAL LOW (ref 39.0–52.0)
Hemoglobin: 11.4 g/dL — ABNORMAL LOW (ref 13.0–17.0)
MCH: 29.7 pg (ref 26.0–34.0)
MCHC: 33.9 g/dL (ref 30.0–36.0)
MCV: 87.5 fL (ref 80.0–100.0)
Platelets: 277 10*3/uL (ref 150–400)
RBC: 3.84 MIL/uL — ABNORMAL LOW (ref 4.22–5.81)
RDW: 12.7 % (ref 11.5–15.5)
WBC: 14.3 10*3/uL — ABNORMAL HIGH (ref 4.0–10.5)
nRBC: 0 % (ref 0.0–0.2)

## 2018-10-23 LAB — COMPREHENSIVE METABOLIC PANEL
ALT: 26 U/L (ref 0–44)
AST: 22 U/L (ref 15–41)
Albumin: 2.5 g/dL — ABNORMAL LOW (ref 3.5–5.0)
Alkaline Phosphatase: 51 U/L (ref 38–126)
Anion gap: 7 (ref 5–15)
BUN: 28 mg/dL — ABNORMAL HIGH (ref 8–23)
CO2: 26 mmol/L (ref 22–32)
Calcium: 8.3 mg/dL — ABNORMAL LOW (ref 8.9–10.3)
Chloride: 101 mmol/L (ref 98–111)
Creatinine, Ser: 0.59 mg/dL — ABNORMAL LOW (ref 0.61–1.24)
GFR calc Af Amer: >60 mL/min (ref >60)
GFR calc non Af Amer: >60 mL/min (ref >60)
Glucose, Bld: 175 mg/dL — ABNORMAL HIGH (ref 70–99)
Potassium: 4.4 mmol/L (ref 3.5–5.1)
Sodium: 134 mmol/L — ABNORMAL LOW (ref 135–145)
Total Bilirubin: 0.7 mg/dL (ref 0.3–1.2)
Total Protein: 5.3 g/dL — ABNORMAL LOW (ref 6.5–8.1)

## 2018-10-23 LAB — GLUCOSE, CAPILLARY
Glucose-Capillary: 143 mg/dL — ABNORMAL HIGH (ref 70–99)
Glucose-Capillary: 353 mg/dL — ABNORMAL HIGH (ref 70–99)
Glucose-Capillary: 302 mg/dL — ABNORMAL HIGH (ref 70–99)

## 2018-10-23 LAB — D-DIMER, QUANTITATIVE: D-Dimer, Quant: 12.13 ug/mL-FEU — ABNORMAL HIGH (ref 0.00–0.50)

## 2018-10-23 MED ORDER — ENSURE MAX PROTEIN PO LIQD
11.0000 [oz_av] | Freq: Every day | ORAL | Status: DC
  Administered 2018-10-23 – 2018-10-29 (×7): 11 [oz_av] via ORAL
  Filled 2018-10-23 (×7): qty 330

## 2018-10-23 MED ORDER — LIP MEDEX EX OINT
TOPICAL_OINTMENT | CUTANEOUS | Status: DC | PRN
  Administered 2018-10-23: 21:00:00 via TOPICAL
  Filled 2018-10-23: qty 7

## 2018-10-23 NOTE — Progress Notes (Signed)
Called son Eddie Mcdaniel) to give update on patient. Eddie Mcdaniel extremely grateful. He works in [employer] as well so he is Patent attorney and understanding. He requested that we pass along the message to dayshift that he would like a call from the physician that is checking on his dad. He understood that Dr. Bonner Puna was going to be away for quite a few days but he would like to speak with a doctor daily and had not heard from a physician in the last 2 days. He also requested that we do a facetime so that his mom could see her husband. We accommodated that request as soon as the phone call ended. Patient was very emotional but happy to speak and see his wife. Will continue to provide care and support as needed for his recovery.

## 2018-10-23 NOTE — Progress Notes (Signed)
Patient sleeping soundly, no distress noted, o2 sats maintaining at 95-97%, bed in low position, call bell in reach, bed alarm on, will monitor.

## 2018-10-23 NOTE — Progress Notes (Signed)
Physical Therapy Treatment Patient Details Name: Eddie Mcdaniel MRN: [id number] DOB: [birth year] Today's Date: 10/23/2018    History of Present Illness 78 y.o. male,  w hypertension, hyperlipidemia, Dm2, CAD, rt rotator cuff repair x2 apparently presents with c/o feeling sick, fever last week, and has had poor po intake and poor energy,  and odd bitter taste in his mouth. Pt denies any travel or covid exposure. Pt admitted 10/15/18 for acute respiratory failure with hypoxia secondary to Covid -19, CAP; +lt pleural effusion    PT Comments    Patient tolerating activity much better. BEGINNING to better understand frequency and use of IS. Able to walk in the hallway for the first time (only on 4L and must be 6L or less to ambulate in hall way). Standing rest breaks x 3 to cover ~225 ft with lowest sats 86% with several minutes of pursed lip breathing (with constant cues for technique) to return to 89-91%. Participated in multiple exercises prior to ambulation with sats 89-95% on 4L.    Follow Up Recommendations  Home health PT;Supervision/Assistance - 24 hour     Equipment Recommendations  None recommended by PT    Recommendations for Other Services       Precautions / Restrictions Precautions Precautions: Fall;Other (comment) Precaution Comments: monitor O2 Restrictions Weight Bearing Restrictions: No    Mobility  Bed Mobility Overal bed mobility: Modified Independent Bed Mobility: Supine to Sit;Sit to Supine     Supine to sit: Modified independent (Device/Increase time) Sit to supine: Modified independent (Device/Increase time)   General bed mobility comments: repeated x 2 due to wanting to lie down to show me an exercise he does at home  Transfers Overall transfer level: Needs assistance Equipment used: None Transfers: Sit to/from [facility] Sit to Stand: Min guard Stand pivot transfers: Min guard       General transfer comment: for safety, lines,  vc for breathing  Ambulation/Gait Ambulation/Gait assistance: Min assist Gait Distance (Feet): 75 Feet(x3 with standing rest breaks between with vc for PLB ) Assistive device: (pt pushing O2 tank (he insisted)) Gait Pattern/deviations: Step-through pattern;Decreased stride length;Staggering right Gait velocity: slightly faster, but remains very slow   General Gait Details: pt brethes in through his nose deeply, but forgets to purse lips to blow out (when cued to do this, he maintains his sats>88% on 4L); staggering steps to his right required min assist to recover x 3   Stairs             Wheelchair Mobility    Modified Rankin (Stroke Patients Only)       Balance Overall balance assessment: Needs assistance Sitting-balance support: Feet supported;No upper extremity supported Sitting balance-Leahy Scale: Normal Sitting balance - Comments: sitting EOC exercises with no imbalance   Standing balance support: No upper extremity supported Standing balance-Leahy Scale: Fair                              Cognition Arousal/Alertness: Awake/alert Behavior During Therapy: WFL for tasks assessed/performed Overall Cognitive Status: No family/caregiver present to determine baseline cognitive functioning                                 General Comments: Patient again today confused on his frequency of using IS, however today he stated he should use it 15x/day. Again reviewed 10x per hour in specific detail (i.e.  starting at 10:00 until 11:00 do 10 breaths; 11:00-12:00 do 10 breaths). By end of session he was initiating asking clarifying questions (still not sure what he was to do) and again specifically reviewed the steps      Exercises General Exercises - Lower Extremity Ankle Circles/Pumps: AROM;Both;20 reps;Seated Long Arc Quad: AROM;Both;10 reps  Hip ABduction/ADduction: AROM;10 reps;Both;Seated Other Exercises Other Exercises: IS x 5 repsx 3 with  fair technique. vc for slower inhalation; Max inspired 750-1052ml    General Comments        Pertinent Vitals/Pain Pain Assessment: No/denies pain    Home Living                      Prior Function            PT Goals (current goals can now be found in the care plan section) Acute Rehab PT Goals Patient Stated Goal: get better and return home Time For Goal Achievement: 10/31/18 Potential to Achieve Goals: Good Progress towards PT goals: Progressing toward goals    Frequency    Min 3X/week      PT Plan Current plan remains appropriate    Co-evaluation              AM-PAC PT "6 Clicks" Mobility   Outcome Measure  Help needed turning from your back to your side while in a flat bed without using bedrails?: None Help needed moving from lying on your back to sitting on the side of a flat bed without using bedrails?: A Little Help needed moving to and from a bed to a chair (including a wheelchair)?: A Little Help needed standing up from a chair using your arms (e.g., wheelchair or bedside chair)?: A Little Help needed to walk in hospital room?: A Little Help needed climbing 3-5 steps with a railing? : A Lot 6 Click Score: 18    End of Session Equipment Utilized During Treatment: Oxygen Activity Tolerance: Treatment limited secondary to medical complications (Comment)(desats) Patient left: with call bell/phone within reach;in chair   PT Visit Diagnosis: Unsteadiness on feet (R26.81);Difficulty in walking, not elsewhere classified (R26.2)     Time: 1000-1031 PT Time Calculation (min) (ACUTE ONLY): 31 min  Charges:  $Gait Training: 23-37 mins                       Barry Brunner, PT       [place] [facility] 10/23/2018, 10:54 AM

## 2018-10-23 NOTE — Progress Notes (Signed)
Updated Eddie Mcdaniel on pts condition, pts son states he wished to talk to Dr. Aletha Halim pass to morning nurse, pt requited Facetime with wife , son was very happy with update , will keep monitoring pt

## 2018-10-23 NOTE — Progress Notes (Signed)
Inpatient Diabetes Program Recommendations  AACE/ADA: New Consensus Statement on Inpatient Glycemic Control (2015)  Target Ranges:  Prepandial:   less than 140 mg/dL      Peak postprandial:   less than 180 mg/dL (1-2 hours)      Critically ill patients:  140 - 180 mg/dL   Lab Results  Component Value Date   GLUCAP 143 (H) 10/23/2018   HGBA1C 6.5 05/07/2017    Review of Glycemic Control Results for SAKETH, FERRAND" (MRN [id number]) as of 10/23/2018 11:34  Ref. Range 10/22/2018 12:00 10/22/2018 15:49 10/22/2018 20:37 10/23/2018 07:58  Glucose-Capillary Latest Ref Range: 70 - 99 mg/dL 368 (H) 387 (H) 367 (H) 143 (H)   Diabetes history: Type 2 DM Outpatient Diabetes medications: Metformin 750 mg QD Current orders for Inpatient glycemic control: Lantus 34 units BID, Novolog 17 units TID, Novolog 0-20 units TID, Novolog 0-5 units QHS Decadron 6 mg QD  Inpatient Diabetes Program Recommendations:    Recommend:  -Increasing Meal coverage to Novolog 20 units TID (assuming patient is consuming >50% of meal and to be tapered once steroids are decreased) -Increase Lantus to 40 units QAM, 35 units QHS.  Thanks, Bronson Curb, MSN, RNC-OB Diabetes Coordinator [phone] (8a-5p)

## 2018-10-23 NOTE — Progress Notes (Signed)
PROGRESS NOTE    Eddie Mcdaniel  [name] DOB: August 22, 1940 DOA: 10/15/2018 PCP: Eddie Held, DO   Brief Narrative:  Eddie Mcdaniel is a 78 y.o. M with hx CAD s/p PCI x1 2017, HTN and DM who presented with 1 week fever, fatigue, malaise, and anosmia. In the ER, CXR showed multifocal pneumonia, was 89% on room air.  Assessment & Plan:  Coronavirus pneumonitis with acute hypoxic respiratory failure, POA -Transfused convalescent plasma on 9/19 -S/p remdesivir x5d -Continues to require upwards of 4 L high flow nasal cannula over the past 24 hours with sats in the low 90s -continue to wean as tolerated -Continue steroids -last dose 10/25/2018 -VTE PPx with Lovenox -Flutter valve, prone, incentive spirometry q2hrs while awake  Hypertension, essential Coronary disease secondary prevrention BP well controlled -Continue amlodipine, carvedilol -Continue aspirin, atorvastatin  Diabetes, non-insulin dependent Glucoses uncontrolled on steroids -Continue Lantus (34u BID), increase dose -Continue mealtime insulin, increase dose -Continue SSI corrections -Hold metformin  Positive blood culture - Diphtheroids in 1/4 blood cultures is likely contaminant.  He has been stable without fever off antibiotics   MDM and disposition: The below labs and imaging reports were reviewed and summarized above.  Medication management as above. The patient was admitted with COVID-19.  He continues to desaturated to 70s with exertion, even despite supplemental O2.     DVT prophylaxis: Lovenox Code Status: FULL Family Communication: Wife by phone   Procedures/Imaging:   CXR 9/18 -- bilateral pneumonia  CT chest 9/21 -- bilateral pneumonia, tracheal devation iwthout mass or hilar adenopathy  Antimicrobials:   Ceftriaxone x1  Azithromycin x1   Culture data:   9/16 blood culture 1/4 positive for diphtheroids  Subjective: No acute issues or events overnight; denies chest pain,  shortness of breath, headache, fevers, or chills.  Objective: Vitals:   10/22/18 1551 10/22/18 1954 10/23/18 0400 10/23/18 0757  BP: (!) 122/55 118/61 (!) 154/75   Pulse: 72 65 68   Resp: 18 18 20    Temp: 97.8 F (36.6 C) 98.2 F (36.8 C) 98.5 F (36.9 C) 98.4 F (36.9 C)  TempSrc: Oral Oral Oral   SpO2: 93%  94%   Weight:      Height:        Intake/Output Summary (Last 24 hours) at 10/23/2018 [id number] Last data filed at 10/23/2018 0400 Gross per 24 hour  Intake 240 ml  Output 1425 ml  Net -1185 ml   Filed Weights   10/16/18 0100 10/16/18 0309  Weight: 66.2 kg 63.4 kg    Examination:  General appearance: overweight adult male, alert and in no acute distress.  Sitting in recliner HEENT: Anicteric, conjunctiva pink, lids and lashes normal. No nasal deformity, discharge, epistaxis.  Lips moist, dentition normal, oropharynx moist, no oral lesions, hearing normal.   Skin: Warm and dry.   No suspicious rashes or lesions on face, neck, arms, legs. Cardiac: RRR, nl S1-S2, no murmurs appreciated. Capillary refill is brisk. JVP normal. No LE edema.  Radial pulses 2+ and symmetric. Respiratory: Respirations labored with exertion. Comfortable at rest. No wheezing or rales. Abdomen: Abdomen soft. No TTP or guarding. No ascites, distension, hepatosplenomegaly. MSK: No deformities or effusions. Neuro: Awake and alert.  EOMI, moves all extremities. Speech fluent.     Data Reviewed: I have personally reviewed following labs and imaging studies:  CBC: Recent Labs  Lab 10/17/18 0930 10/19/18 0147 10/21/18 0310 10/23/18 0325  WBC 13.7* 15.7* 15.1* 14.3*  NEUTROABS 12.4* 13.9*  13.6*  --   HGB 11.3* 11.3* 11.0* 11.4*  HCT 32.6* 32.4* 31.6* 33.6*  MCV 87.4 85.9 85.9 87.5  PLT 210 260 278 [id number]   Basic Metabolic Panel: Recent Labs  Lab 10/17/18 0930 10/19/18 0147 10/21/18 0310 10/23/18 0325  NA 134* 137 132* 134*  K 3.6 4.0 4.1 4.4  CL 102 101 99 101  CO2 21* 27 24 26   GLUCOSE  363* 129* 283* 175*  BUN 21 19 27* 28*  CREATININE 0.81 0.61 0.70 0.59*  CALCIUM 8.2* 8.7* 8.2* 8.3*   GFR: Estimated Creatinine Clearance: 68.3 mL/min (A) (by C-G formula based on SCr of 0.59 mg/dL (L)). Liver Function Tests: Recent Labs  Lab 10/17/18 0930 10/19/18 0147 10/21/18 0310 10/23/18 0325  AST 41 31 22 22   ALT 31 28 23 26   ALKPHOS 46 51 53 51  BILITOT 0.6 0.5 0.6 0.7  PROT 6.3* 6.1* 5.7* 5.3*  ALBUMIN 3.0* 3.0* 2.6* 2.5*   No results for input(s): LIPASE, AMYLASE in the last 168 hours. No results for input(s): AMMONIA in the last 168 hours. Coagulation Profile: No results for input(s): INR, PROTIME in the last 168 hours. Cardiac Enzymes: No results for input(s): CKTOTAL, CKMB, CKMBINDEX, TROPONINI in the last 168 hours. BNP (last 3 results) No results for input(s): PROBNP in the last 8760 hours. HbA1C: No results for input(s): HGBA1C in the last 72 hours. CBG: Recent Labs  Lab 10/22/18 0747 10/22/18 1200 10/22/18 1549 10/22/18 2037 10/23/18 0758  GLUCAP 130* 368* 387* 367* 143*   Lipid Profile: No results for input(s): CHOL, HDL, LDLCALC, TRIG, CHOLHDL, LDLDIRECT in the last 72 hours. Thyroid Function Tests: No results for input(s): TSH, T4TOTAL, FREET4, T3FREE, THYROIDAB in the last 72 hours. Anemia Panel: No results for input(s): VITAMINB12, FOLATE, FERRITIN, TIBC, IRON, RETICCTPCT in the last 72 hours.   Urine analysis:    Component Value Date/Time   COLORURINE AMBER (A) 10/15/2018 1730   APPEARANCEUR HAZY (A) 10/15/2018 1730   LABSPEC 1.019 10/15/2018 1730   PHURINE 5.0 10/15/2018 1730   GLUCOSEU NEGATIVE 10/15/2018 1730   HGBUR NEGATIVE 10/15/2018 1730   HGBUR small 11/18/2009 0910   BILIRUBINUR NEGATIVE 10/15/2018 1730   BILIRUBINUR neg 10/25/2015 1141   KETONESUR NEGATIVE 10/15/2018 1730   PROTEINUR 30 (A) 10/15/2018 1730   UROBILINOGEN 0.2 10/25/2015 1141   UROBILINOGEN negative 11/18/2009 0910   NITRITE NEGATIVE 10/15/2018 1730    LEUKOCYTESUR NEGATIVE 10/15/2018 1730    Recent Results (from the past 240 hour(s))  SARS Coronavirus 2 [facility] order, Performed in [facility] hospital lab) Nasopharyngeal Nasopharyngeal Swab     Status: Abnormal   Collection Time: 10/15/18  5:10 PM   Specimen: Nasopharyngeal Swab  Result Value Ref Range Status   SARS Coronavirus 2 POSITIVE (A) NEGATIVE Final    Comment: RESULT CALLED TO, READ BACK BY AND VERIFIED WITH: Marzella Schlein RN 1910 10/15/18 A BROWNING (NOTE) If result is NEGATIVE SARS-CoV-2 target nucleic acids are NOT DETECTED. The SARS-CoV-2 RNA is generally detectable in upper and lower  respiratory specimens during the acute phase of infection. The lowest  concentration of SARS-CoV-2 viral copies this assay can detect is 250  copies / mL. A negative result does not preclude SARS-CoV-2 infection  and should not be used as the sole basis for treatment or other  patient management decisions.  A negative result may occur with  improper specimen collection / handling, submission of specimen other  than nasopharyngeal swab, presence of viral mutation(s) within the  areas targeted by this assay, and inadequate number of viral copies  (<250 copies / mL). A negative result must be combined with clinical  observations, patient history, and epidemiological information. If result is POSITIVE SARS-CoV-2 target nucleic acids are DETECTED. T he SARS-CoV-2 RNA is generally detectable in upper and lower  respiratory specimens during the acute phase of infection.  Positive  results are indicative of active infection with SARS-CoV-2.  Clinical  correlation with patient history and other diagnostic information is  necessary to determine patient infection status.  Positive results do  not rule out bacterial infection or co-infection with other viruses. If result is PRESUMPTIVE POSTIVE SARS-CoV-2 nucleic acids MAY BE PRESENT.   A presumptive positive result was obtained on the submitted  specimen  and confirmed on repeat testing.  While 2019 novel coronavirus  (SARS-CoV-2) nucleic acids may be present in the submitted sample  additional confirmatory testing may be necessary for epidemiological  and / or clinical management purposes  to differentiate between  SARS-CoV-2 and other Sarbecovirus currently known to infect humans.  If clinically indicated additional testing with an alternate test  methodology [id number]) is  advised. The SARS-CoV-2 RNA is generally  detectable in upper and lower respiratory specimens during the acute  phase of infection. The expected result is Negative. Fact Sheet for Patients:  StrictlyIdeas.no Fact Sheet for Healthcare Providers: BankingDealers.co.za This test is not yet approved or cleared by the [place] FDA and has been authorized for detection and/or diagnosis of SARS-CoV-2 by FDA under an Emergency Use Authorization (EUA).  This EUA will remain in effect (meaning this test can be used) for the duration of the COVID-19 declaration under Section 564(b)(1) of the Act, 21 U.S.C. section 360bbb-3(b)(1), unless the authorization is terminated or revoked sooner. Performed at [facility], [place] [address], [place], [place] [address]   Blood Culture (routine x 2)     Status: Abnormal   Collection Time: 10/15/18  8:53 PM   Specimen: BLOOD  Result Value Ref Range Status   Specimen Description BLOOD RIGHT WRIST  Final   Special Requests   Final    BOTTLES DRAWN AEROBIC AND ANAEROBIC Blood Culture adequate volume   Culture  Setup Time   Final    GRAM POSITIVE RODS AEROBIC BOTTLE ONLY CRITICAL RESULT CALLED TO, READ BACK BY AND VERIFIED WITH: H.ZOMPA PHARMD, AT 1008 10/18/18 BY D. VANHOOK    Culture (A)  Final    DIPHTHEROIDS(CORYNEBACTERIUM SPECIES) Standardized susceptibility testing for this organism is not available. Performed at [facility], [place] [address] [place],  [place] [address]    Report Status 10/19/2018 FINAL  Final  Blood Culture (routine x 2)     Status: None   Collection Time: 10/15/18  9:18 PM   Specimen: BLOOD  Result Value Ref Range Status   Specimen Description BLOOD LEFT WRIST  Final   Special Requests   Final    BOTTLES DRAWN AEROBIC AND ANAEROBIC Blood Culture adequate volume   Culture   Final    NO GROWTH 5 DAYS Performed at [facility], [place] [address], [place], [place] [address]    Report Status 10/20/2018 FINAL  Final     Radiology Studies: No results found.  Scheduled Meds: . amLODipine  5 mg Oral Daily  . aspirin EC  81 mg Oral Daily  . atorvastatin  20 mg Oral QPM  . carvedilol  12.5 mg Oral BID WC  . dexamethasone (DECADRON) injection  6 mg Intravenous Q24H  . enoxaparin (LOVENOX) injection  40 mg Subcutaneous Q24H  . influenza vaccine adjuvanted  0.5 mL Intramuscular Tomorrow-1000  . insulin aspart  0-20 Units Subcutaneous TID WC  . insulin aspart  0-5 Units Subcutaneous QHS  . insulin aspart  17 Units Subcutaneous TID WC  . insulin glargine  34 Units Subcutaneous BID  . mouth rinse  15 mL Mouth Rinse BID  . oxymetazoline  1 spray Each Nare BID  . sodium chloride flush  3 mL Intravenous Once   Continuous Infusions:   LOS: 8 days   Time spent: 35 minutes   Little Ishikawa, DO [facility] 10/23/2018, 8:23 AM   Please page through [place]:  [email or web address] Contact charge nurse for password If 7PM-7AM, please contact night-coverage

## 2018-10-23 NOTE — Plan of Care (Signed)
  Problem: Education: Goal: Knowledge of risk factors and measures for prevention of condition will improve Outcome: Progressing   Problem: Respiratory: Goal: Will maintain a patent airway Outcome: Progressing Goal: Complications related to the disease process, condition or treatment will be avoided or minimized Outcome: Progressing   Problem: Education: Goal: Knowledge of General Education information will improve Description: Including pain rating scale, medication(s)/side effects and non-pharmacologic comfort measures Outcome: Progressing   Problem: Health Behavior/Discharge Planning: Goal: Ability to manage health-related needs will improve Outcome: Progressing   Problem: Clinical Measurements: Goal: Ability to maintain clinical measurements within normal limits will improve Outcome: Progressing Goal: Will remain free from infection Outcome: Progressing Goal: Diagnostic test results will improve Outcome: Progressing Goal: Respiratory complications will improve Outcome: Progressing Goal: Cardiovascular complication will be avoided Outcome: Progressing   Problem: Activity: Goal: Risk for activity intolerance will decrease Outcome: Progressing   Problem: Nutrition: Goal: Adequate nutrition will be maintained Outcome: Progressing   Problem: Coping: Goal: Level of anxiety will decrease Outcome: Progressing

## 2018-10-23 NOTE — Progress Notes (Signed)
Occupational Therapy Treatment Patient Details Name: Eddie Mcdaniel MRN: [id number] DOB: [birth year] Today's Date: 10/23/2018    History of present illness 78 y.o. male,  w hypertension, hyperlipidemia, Dm2, CAD, rt rotator cuff repair x2 apparently presents with c/o feeling sick, fever last week, and has had poor po intake and poor energy,  and odd bitter taste in his mouth. Pt denies any travel or covid exposure. Pt admitted 10/15/18 for acute respiratory failure with hypoxia secondary to Covid -19, CAP; +lt pleural effusion   OT comments  Entered room with SpO2 86 on RA due to O2 tubing being pulled off wall. Pt aware and states he had been trying to contact nurse. Charge nurse states they did not get a call at the nurses station and were not notified by tele monitoring. Pt placed on 4L and SpO2 returned to 96 and breathing improved. Pt then completed ADL session with overall set up for UB and min A for LB due to fatigue. Educated pt on energy conservation strategies during ADL session. Pt required multiple rest breaks and was able to maintain SpO2 on 4L throughout session. Pt very appreciative. Continue to recommend [place] after DC. Will follow acutely.   Follow Up Recommendations  Home health OT;Supervision/Assistance - 24 hour    Equipment Recommendations  Tub/shower seat    Recommendations for Other Services      Precautions / Restrictions Precautions Precautions: Fall;Other (comment) Precaution Comments: monitor O2 Restrictions Weight Bearing Restrictions: No       Mobility Bed Mobility Overal bed mobility: Modified Independent                Transfers Overall transfer level: Needs assistance   Transfers: Sit to/from Stand;Stand Pivot Transfers Sit to Stand: Supervision Stand pivot transfers: Supervision            Balance     Sitting balance-Leahy Scale: Normal       Standing balance-Leahy Scale: Fair                             ADL  either performed or assessed with clinical judgement   ADL Overall ADL's : Needs assistance/impaired     Grooming: Set up;Sitting   Upper Body Bathing: Set up;Sitting   Lower Body Bathing: Min guard;Sit to/from stand   Upper Body Dressing : Set up;Sitting   Lower Body Dressing: Minimal assistance;Sit to/from stand Lower Body Dressing Details (indicate cue type and reason): a for socks due to fatigue             Functional mobility during ADLs: Min guard General ADL Comments: Able to complete full bath adn assist with dressing on 4L with SpO2 sats remianing in 90s. Began educating pt on energy conservation strategies during ADL     Vision       Perception     Praxis      Cognition Arousal/Alertness: Awake/alert Behavior During Therapy: WFL for tasks assessed/performed Overall Cognitive Status: No family/caregiver present to determine baseline cognitive functioning                                 General Comments: Pt states he has been calling out for nursing to help, but nurse states she has not been called; pt aware that his O2 level was 86 due to O2 tubing being pulled off the wall  Exercises     Shoulder Instructions       General Comments      Pertinent Vitals/ Pain       Pain Assessment: No/denies pain  Home Living                                          Prior Functioning/Environment              Frequency  Min 3X/week        Progress Toward Goals  OT Goals(current goals can now be found in the care plan section)  Progress towards OT goals: Progressing toward goals  Acute Rehab OT Goals Patient Stated Goal: get better and return home OT Goal Formulation: With patient Time For Goal Achievement: 10/31/18 Potential to Achieve Goals: Good ADL Goals Pt Will Perform Grooming: with modified independence;standing Pt Will Perform Lower Body Bathing: with modified independence;sit to/from stand Pt Will  Perform Lower Body Dressing: with modified independence;sit to/from stand Pt Will Transfer to Toilet: with modified independence;ambulating Pt Will Perform Toileting - Clothing Manipulation and hygiene: with modified independence;sit to/from stand Pt/caregiver will Perform Home Exercise Program: Increased strength;Both right and left upper extremity;With theraband;With written HEP provided;Independently Additional ADL Goal #1: Pt will independently verbalize/demonstrate at least 3 energy conservation techniques during functional task.  Plan Discharge plan remains appropriate;Frequency needs to be updated    Co-evaluation                 AM-PAC OT "6 Clicks" Daily Activity     Outcome Measure   Help from another person eating meals?: None Help from another person taking care of personal grooming?: A Little Help from another person toileting, which includes using toliet, bedpan, or urinal?: A Little Help from another person bathing (including washing, rinsing, drying)?: A Little Help from another person to put on and taking off regular upper body clothing?: A Little Help from another person to put on and taking off regular lower body clothing?: A Little 6 Click Score: 19    End of Session Equipment Utilized During Treatment: Oxygen(4L)  OT Visit Diagnosis: Muscle weakness (generalized) (M62.81);Other (comment)   Activity Tolerance Patient tolerated treatment well   Patient Left in bed;with call bell/phone within reach   Nurse Communication Mobility status;Other (comment)(SpO2 86 on entry; O2 pulled off wall)        Time: [id number] OT Time Calculation (min): 39 min  Charges: OT General Charges $OT Visit: 1 Visit OT Treatments $Self Care/Home Management : 38-52 mins  Maurie Boettcher, OT/L   Acute OT Clinical Specialist [place] Pager [phone] Office [phone]    [facility] 10/23/2018, 6:18 PM

## 2018-10-24 LAB — GLUCOSE, CAPILLARY
Glucose-Capillary: 379 mg/dL — ABNORMAL HIGH (ref 70–99)
Glucose-Capillary: 189 mg/dL — ABNORMAL HIGH (ref 70–99)
Glucose-Capillary: 271 mg/dL — ABNORMAL HIGH (ref 70–99)
Glucose-Capillary: 235 mg/dL — ABNORMAL HIGH (ref 70–99)
Glucose-Capillary: 242 mg/dL — ABNORMAL HIGH (ref 70–99)

## 2018-10-24 MED ORDER — INSULIN GLARGINE 100 UNIT/ML ~~LOC~~ SOLN
40.0000 [IU] | Freq: Two times a day (BID) | SUBCUTANEOUS | Status: DC
  Administered 2018-10-24 – 2018-10-26 (×5): 40 [IU] via SUBCUTANEOUS
  Filled 2018-10-24 (×8): qty 0.4

## 2018-10-24 NOTE — Progress Notes (Signed)
PROGRESS NOTE    Eddie Mcdaniel  [id number] DOB: [birth year] DOA: 10/15/2018 PCP: Ann Held, DO   Brief Narrative:  Eddie Mcdaniel is a 78 y.o. M with hx CAD s/p PCI x1 2017, HTN and DM who presented with 1 week fever, fatigue, malaise, and anosmia. In the ER, CXR showed multifocal pneumonia, was 89% on room air.  Assessment & Plan:  Coronavirus pneumonitis with acute hypoxic respiratory failure, POA - Transfused convalescent plasma on 9/19 - S/p remdesivir x5d - Patient able to wean off of oxygen today at rest satting low 90s on room air. -She continues to desat to low 80s with even minimal exertion. - Dexamethasone to complete on 10/25/2018 - VTE PPx with Lovenox - Flutter valve, prone, incentive spirometry q2hrs while awake  Hypertension, essential Coronary disease secondary prevrention BP well controlled -Continue amlodipine, carvedilol -Continue aspirin, atorvastatin  Diabetes, non-insulin dependent Glucoses uncontrolled on steroids -Increase Lantus (40u BID) -Continue mealtime insulin - 17u -Continue SSI corrections -Hold metformin  Positive blood culture - Diphtheroids in 1/4 blood cultures is likely contaminant.  He has been stable without fever off antibiotics   MDM and disposition: The below labs and imaging reports were reviewed and summarized above.  Medication management as above. The patient was admitted with COVID-19.  He continues to desaturated to 80s with exertion, even despite supplemental O2.     DVT prophylaxis: Lovenox Code Status: FULL Family Communication: Wife by phone   Procedures/Imaging:   CXR 9/18 -- bilateral pneumonia  CT chest 9/21 -- bilateral pneumonia, tracheal devation iwthout mass or hilar adenopathy  Antimicrobials:   Ceftriaxone x1  Azithromycin x1   Culture data:   9/16 blood culture 1/4 positive for diphtheroids  Subjective: No acute issues or events overnight; denies chest pain, shortness of  breath, headache, fevers, or chills.  Objective: Vitals:   10/24/18 0000 10/24/18 0300 10/24/18 0736 10/24/18 0824  BP:  (!) 148/84 114/61   Pulse: 63  66 70  Resp:      Temp:  98.1 F (36.7 C) 98.3 F (36.8 C)   TempSrc:   Oral   SpO2: 97%  98%   Weight:      Height:        Intake/Output Summary (Last 24 hours) at 10/24/2018 0847 Last data filed at 10/24/2018 0305 Gross per 24 hour  Intake 840 ml  Output 1375 ml  Net -535 ml   Filed Weights   10/16/18 0100 10/16/18 0309  Weight: 66.2 kg 63.4 kg    Examination:  General appearance: overweight adult male, alert and in no acute distress.  Sitting in recliner HEENT: Anicteric, conjunctiva pink, lids and lashes normal. No nasal deformity, discharge, epistaxis.  Lips moist, dentition normal, oropharynx moist, no oral lesions, hearing normal.   Skin: Warm and dry.   No suspicious rashes or lesions on face, neck, arms, legs. Cardiac: RRR, nl S1-S2, no murmurs appreciated. Capillary refill is brisk. JVP normal. No LE edema.  Radial pulses 2+ and symmetric. Respiratory: Respirations labored with exertion. Comfortable at rest. No wheezing or rales. Abdomen: Abdomen soft. No TTP or guarding. No ascites, distension, hepatosplenomegaly. MSK: No deformities or effusions. Neuro: Awake and alert.  EOMI, moves all extremities. Speech fluent.     Data Reviewed: I have personally reviewed following labs and imaging studies:  CBC: Recent Labs  Lab 10/17/18 0930 10/19/18 0147 10/21/18 0310 10/23/18 0325  WBC 13.7* 15.7* 15.1* 14.3*  NEUTROABS 12.4* 13.9* 13.6*  --  HGB 11.3* 11.3* 11.0* 11.4*  HCT 32.6* 32.4* 31.6* 33.6*  MCV 87.4 85.9 85.9 87.5  PLT 210 260 278 [id number]   Basic Metabolic Panel: Recent Labs  Lab 10/17/18 0930 10/19/18 0147 10/21/18 0310 10/23/18 0325  NA 134* 137 132* 134*  K 3.6 4.0 4.1 4.4  CL 102 101 99 101  CO2 21* 27 24 26   GLUCOSE 363* 129* 283* 175*  BUN 21 19 27* 28*  CREATININE 0.81 0.61 0.70 0.59*   CALCIUM 8.2* 8.7* 8.2* 8.3*   GFR: Estimated Creatinine Clearance: 68.3 mL/min (A) (by C-G formula based on SCr of 0.59 mg/dL (L)). Liver Function Tests: Recent Labs  Lab 10/17/18 0930 10/19/18 0147 10/21/18 0310 10/23/18 0325  AST 41 31 22 22   ALT 31 28 23 26   ALKPHOS 46 51 53 51  BILITOT 0.6 0.5 0.6 0.7  PROT 6.3* 6.1* 5.7* 5.3*  ALBUMIN 3.0* 3.0* 2.6* 2.5*   No results for input(s): LIPASE, AMYLASE in the last 168 hours. No results for input(s): AMMONIA in the last 168 hours. Coagulation Profile: No results for input(s): INR, PROTIME in the last 168 hours. Cardiac Enzymes: No results for input(s): CKTOTAL, CKMB, CKMBINDEX, TROPONINI in the last 168 hours. BNP (last 3 results) No results for input(s): PROBNP in the last 8760 hours. HbA1C: No results for input(s): HGBA1C in the last 72 hours. CBG: Recent Labs  Lab 10/23/18 0758 10/23/18 1123 10/23/18 1616 10/23/18 2048 10/24/18 0738  GLUCAP 143* 353* 379* 302* 189*   Lipid Profile: No results for input(s): CHOL, HDL, LDLCALC, TRIG, CHOLHDL, LDLDIRECT in the last 72 hours. Thyroid Function Tests: No results for input(s): TSH, T4TOTAL, FREET4, T3FREE, THYROIDAB in the last 72 hours. Anemia Panel: No results for input(s): VITAMINB12, FOLATE, FERRITIN, TIBC, IRON, RETICCTPCT in the last 72 hours.   Urine analysis:    Component Value Date/Time   COLORURINE AMBER (A) 10/15/2018 1730   APPEARANCEUR HAZY (A) 10/15/2018 1730   LABSPEC 1.019 10/15/2018 1730   PHURINE 5.0 10/15/2018 1730   GLUCOSEU NEGATIVE 10/15/2018 1730   HGBUR NEGATIVE 10/15/2018 1730   HGBUR small 11/18/2009 0910   BILIRUBINUR NEGATIVE 10/15/2018 1730   BILIRUBINUR neg 10/25/2015 1141   KETONESUR NEGATIVE 10/15/2018 1730   PROTEINUR 30 (A) 10/15/2018 1730   UROBILINOGEN 0.2 10/25/2015 1141   UROBILINOGEN negative 11/18/2009 0910   NITRITE NEGATIVE 10/15/2018 1730   LEUKOCYTESUR NEGATIVE 10/15/2018 1730    Recent Results (from the past 240  hour(s))  SARS Coronavirus 2 [facility] order, Performed in [facility] hospital lab) Nasopharyngeal Nasopharyngeal Swab     Status: Abnormal   Collection Time: 10/15/18  5:10 PM   Specimen: Nasopharyngeal Swab  Result Value Ref Range Status   SARS Coronavirus 2 POSITIVE (A) NEGATIVE Final    Comment: RESULT CALLED TO, READ BACK BY AND VERIFIED WITH: Marzella Schlein RN 1910 10/15/18 A BROWNING (NOTE) If result is NEGATIVE SARS-CoV-2 target nucleic acids are NOT DETECTED. The SARS-CoV-2 RNA is generally detectable in upper and lower  respiratory specimens during the acute phase of infection. The lowest  concentration of SARS-CoV-2 viral copies this assay can detect is 250  copies / mL. A negative result does not preclude SARS-CoV-2 infection  and should not be used as the sole basis for treatment or other  patient management decisions.  A negative result may occur with  improper specimen collection / handling, submission of specimen other  than nasopharyngeal swab, presence of viral mutation(s) within the  areas targeted by this  assay, and inadequate number of viral copies  (<250 copies / mL). A negative result must be combined with clinical  observations, patient history, and epidemiological information. If result is POSITIVE SARS-CoV-2 target nucleic acids are DETECTED. T he SARS-CoV-2 RNA is generally detectable in upper and lower  respiratory specimens during the acute phase of infection.  Positive  results are indicative of active infection with SARS-CoV-2.  Clinical  correlation with patient history and other diagnostic information is  necessary to determine patient infection status.  Positive results do  not rule out bacterial infection or co-infection with other viruses. If result is PRESUMPTIVE POSTIVE SARS-CoV-2 nucleic acids MAY BE PRESENT.   A presumptive positive result was obtained on the submitted specimen  and confirmed on repeat testing.  While 2019 novel coronavirus   (SARS-CoV-2) nucleic acids may be present in the submitted sample  additional confirmatory testing may be necessary for epidemiological  and / or clinical management purposes  to differentiate between  SARS-CoV-2 and other Sarbecovirus currently known to infect humans.  If clinically indicated additional testing with an alternate test  methodology [phone]) is  advised. The SARS-CoV-2 RNA is generally  detectable in upper and lower respiratory specimens during the acute  phase of infection. The expected result is Negative. Fact Sheet for Patients:  StrictlyIdeas.no Fact Sheet for Healthcare Providers: BankingDealers.co.za This test is not yet approved or cleared by the [place] FDA and has been authorized for detection and/or diagnosis of SARS-CoV-2 by FDA under an Emergency Use Authorization (EUA).  This EUA will remain in effect (meaning this test can be used) for the duration of the COVID-19 declaration under Section 564(b)(1) of the Act, 21 U.S.C. section 360bbb-3(b)(1), unless the authorization is terminated or revoked sooner. Performed at [facility], [place] [address] [place], [place] [address]   Blood Culture (routine x 2)     Status: Abnormal   Collection Time: 10/15/18  8:53 PM   Specimen: BLOOD  Result Value Ref Range Status   Specimen Description BLOOD RIGHT WRIST  Final   Special Requests   Final    BOTTLES DRAWN AEROBIC AND ANAEROBIC Blood Culture adequate volume   Culture  Setup Time   Final    GRAM POSITIVE RODS AEROBIC BOTTLE ONLY CRITICAL RESULT CALLED TO, READ BACK BY AND VERIFIED WITH: H.ZOMPA PHARMD, AT 1008 10/18/18 BY D. VANHOOK    Culture (A)  Final    DIPHTHEROIDS(CORYNEBACTERIUM SPECIES) Standardized susceptibility testing for this organism is not available. Performed at [facility], [place] [address] [place], [place] [address]    Report Status 10/19/2018 FINAL  Final  Blood Culture  (routine x 2)     Status: None   Collection Time: 10/15/18  9:18 PM   Specimen: BLOOD  Result Value Ref Range Status   Specimen Description BLOOD LEFT WRIST  Final   Special Requests   Final    BOTTLES DRAWN AEROBIC AND ANAEROBIC Blood Culture adequate volume   Culture   Final    NO GROWTH 5 DAYS Performed at [facility], [place] [address], [place], [place] [address]    Report Status 10/20/2018 FINAL  Final     Radiology Studies: No results found.  Scheduled Meds: . amLODipine  5 mg Oral Daily  . aspirin EC  81 mg Oral Daily  . atorvastatin  20 mg Oral QPM  . carvedilol  12.5 mg Oral BID WC  . dexamethasone (DECADRON) injection  6 mg Intravenous Q24H  .  enoxaparin (LOVENOX) injection  40 mg Subcutaneous Q24H  . influenza vaccine adjuvanted  0.5 mL Intramuscular Tomorrow-1000  . insulin aspart  0-20 Units Subcutaneous TID WC  . insulin aspart  0-5 Units Subcutaneous QHS  . insulin aspart  17 Units Subcutaneous TID WC  . insulin glargine  34 Units Subcutaneous BID  . mouth rinse  15 mL Mouth Rinse BID  . oxymetazoline  1 spray Each Nare BID  . Ensure Max Protein  11 oz Oral Daily  . sodium chloride flush  3 mL Intravenous Once   Continuous Infusions:   LOS: 9 days   Time spent: 35 minutes   Little Ishikawa, DO [facility] 10/24/2018, 8:47 AM   Please page through [place]:  [email or web address] Contact charge nurse for password If 7PM-7AM, please contact night-coverage

## 2018-10-25 LAB — CBC
HCT: 38.2 % — ABNORMAL LOW (ref 39.0–52.0)
Hemoglobin: 12.9 g/dL — ABNORMAL LOW (ref 13.0–17.0)
MCH: 29.7 pg (ref 26.0–34.0)
MCHC: 33.8 g/dL (ref 30.0–36.0)
MCV: 88 fL (ref 80.0–100.0)
Platelets: 295 10*3/uL (ref 150–400)
RBC: 4.34 MIL/uL (ref 4.22–5.81)
RDW: 13.1 % (ref 11.5–15.5)
WBC: 18.7 10*3/uL — ABNORMAL HIGH (ref 4.0–10.5)
nRBC: 0 % (ref 0.0–0.2)

## 2018-10-25 LAB — D-DIMER, QUANTITATIVE: D-Dimer, Quant: 8.5 ug/mL-FEU — ABNORMAL HIGH (ref 0.00–0.50)

## 2018-10-25 LAB — COMPREHENSIVE METABOLIC PANEL
ALT: 62 U/L — ABNORMAL HIGH (ref 0–44)
AST: 38 U/L (ref 15–41)
Albumin: 2.7 g/dL — ABNORMAL LOW (ref 3.5–5.0)
Alkaline Phosphatase: 54 U/L (ref 38–126)
Anion gap: 9 (ref 5–15)
BUN: 24 mg/dL — ABNORMAL HIGH (ref 8–23)
CO2: 25 mmol/L (ref 22–32)
Calcium: 8.8 mg/dL — ABNORMAL LOW (ref 8.9–10.3)
Chloride: 101 mmol/L (ref 98–111)
Creatinine, Ser: 0.73 mg/dL (ref 0.61–1.24)
GFR calc Af Amer: >60 mL/min (ref >60)
GFR calc non Af Amer: >60 mL/min (ref >60)
Glucose, Bld: 127 mg/dL — ABNORMAL HIGH (ref 70–99)
Potassium: 4.9 mmol/L (ref 3.5–5.1)
Sodium: 135 mmol/L (ref 135–145)
Total Bilirubin: 0.8 mg/dL (ref 0.3–1.2)
Total Protein: 5.9 g/dL — ABNORMAL LOW (ref 6.5–8.1)

## 2018-10-25 LAB — GLUCOSE, CAPILLARY
Glucose-Capillary: 119 mg/dL — ABNORMAL HIGH (ref 70–99)
Glucose-Capillary: 257 mg/dL — ABNORMAL HIGH (ref 70–99)
Glucose-Capillary: 183 mg/dL — ABNORMAL HIGH (ref 70–99)

## 2018-10-25 NOTE — Progress Notes (Signed)
PROGRESS NOTE    Eddie Mcdaniel  [id number] DOB: [birth year] DOA: 10/15/2018 PCP: Ann Held, DO   Brief Narrative:  Mr. Cosner is a 78 y.o. M with hx CAD s/p PCI x1 2017, HTN and DM who presented with 1 week fever, fatigue, malaise, and anosmia. In the ER, CXR showed multifocal pneumonia, was 89% on room air.  Assessment & Plan:   Coronavirus pneumonitis with acute hypoxic respiratory failure, POA - Transfused convalescent plasma on 9/19 - S/p remdesivir x5d - Patient able to wean off of oxygen today at rest satting low 90s on room air. - -Desaturated to 85% with ambulation, minimally hypoxic at 87% on 2 L nasal cannula, patient did resolve with 4 L nasal cannula with exertion with sats up to 96%. - Dexamethasone to complete on 10/25/2018 - VTE PPx with Lovenox - Flutter valve, prone, incentive spirometry q2hrs while awake Recent Labs    10/23/18 0325 10/25/18 0522  DDIMER 12.13* 8.50*    Hypertension, essential Coronary disease secondary prevrention BP well controlled -Continue amlodipine, carvedilol -Continue aspirin, atorvastatin  Diabetes, non-insulin dependent Glucoses uncontrolled on steroids -Increase Lantus (40u BID) -Continue mealtime insulin - 17u -Continue SSI corrections -Hold metformin  Positive blood culture - Diphtheroids in 1/4 blood cultures is likely contaminant.  He has been stable without fever off antibiotics   MDM and disposition: The below labs and imaging reports were reviewed and summarized above.  Medication management as above. The patient was admitted with COVID-19.  He continues to desaturated to 80s with exertion, even despite supplemental O2.     DVT prophylaxis: Lovenox Code Status: FULL Family Communication: Son by phone   Procedures/Imaging:   CXR 9/18 -- bilateral pneumonia  CT chest 9/21 -- bilateral pneumonia, tracheal devation iwthout mass or hilar adenopathy  Antimicrobials:   Ceftriaxone x1   Azithromycin x1   Culture data:   9/16 blood culture 1/4 positive for diphtheroids  Subjective: No acute issues or events overnight; denies chest pain, shortness of breath, headache, fevers, or chills.  Objective: Vitals:   10/24/18 1726 10/24/18 2051 10/25/18 0500 10/25/18 0727  BP:  132/62 (!) 153/76 (!) 144/68  Pulse: 70   69  Resp:  18    Temp:  98 F (36.7 C) 98.9 F (37.2 C) 98.7 F (37.1 C)  TempSrc:  Oral Oral Oral  SpO2:    92%  Weight:      Height:        Intake/Output Summary (Last 24 hours) at 10/25/2018 0911 Last data filed at 10/25/2018 [id number] Gross per 24 hour  Intake 500 ml  Output 1651 ml  Net -1151 ml   Filed Weights   10/16/18 0100 10/16/18 0309  Weight: 66.2 kg 63.4 kg    Examination:  General appearance: overweight adult male, alert and in no acute distress.  Sitting in recliner HEENT: Anicteric, conjunctiva pink, lids and lashes normal. No nasal deformity, discharge, epistaxis.  Lips moist, dentition normal, oropharynx moist, no oral lesions, hearing normal.   Skin: Warm and dry.   No suspicious rashes or lesions on face, neck, arms, legs. Cardiac: RRR, nl S1-S2, no murmurs appreciated. Capillary refill is brisk. JVP normal. No LE edema.  Radial pulses 2+ and symmetric. Respiratory: Respirations labored with exertion. Comfortable at rest. No wheezing or rales. Abdomen: Abdomen soft. No TTP or guarding. No ascites, distension, hepatosplenomegaly. MSK: No deformities or effusions. Neuro: Awake and alert.  EOMI, moves all extremities. Speech fluent.  Data Reviewed: I have personally reviewed following labs and imaging studies:  CBC: Recent Labs  Lab 10/19/18 0147 10/21/18 0310 10/23/18 0325 10/25/18 0522  WBC 15.7* 15.1* 14.3* 18.7*  NEUTROABS 13.9* 13.6*  --   --   HGB 11.3* 11.0* 11.4* 12.9*  HCT 32.4* 31.6* 33.6* 38.2*  MCV 85.9 85.9 87.5 88.0  PLT 260 278 277 [id number]   Basic Metabolic Panel: Recent Labs  Lab 10/19/18 0147 10/21/18  0310 10/23/18 0325 10/25/18 0522  NA 137 132* 134* 135  K 4.0 4.1 4.4 4.9  CL 101 99 101 101  CO2 27 24 26 25   GLUCOSE 129* 283* 175* 127*  BUN 19 27* 28* 24*  CREATININE 0.61 0.70 0.59* 0.73  CALCIUM 8.7* 8.2* 8.3* 8.8*   GFR: Estimated Creatinine Clearance: 68.3 mL/min (by C-G formula based on SCr of 0.73 mg/dL). Liver Function Tests: Recent Labs  Lab 10/19/18 0147 10/21/18 0310 10/23/18 0325 10/25/18 0522  AST 31 22 22  38  ALT 28 23 26  62*  ALKPHOS 51 53 51 54  BILITOT 0.5 0.6 0.7 0.8  PROT 6.1* 5.7* 5.3* 5.9*  ALBUMIN 3.0* 2.6* 2.5* 2.7*   No results for input(s): LIPASE, AMYLASE in the last 168 hours. No results for input(s): AMMONIA in the last 168 hours. Coagulation Profile: No results for input(s): INR, PROTIME in the last 168 hours. Cardiac Enzymes: No results for input(s): CKTOTAL, CKMB, CKMBINDEX, TROPONINI in the last 168 hours. BNP (last 3 results) No results for input(s): PROBNP in the last 8760 hours. HbA1C: No results for input(s): HGBA1C in the last 72 hours. CBG: Recent Labs  Lab 10/24/18 0738 10/24/18 1129 10/24/18 1657 10/24/18 2115 10/25/18 0723  GLUCAP 189* 271* 235* 242* 119*   Lipid Profile: No results for input(s): CHOL, HDL, LDLCALC, TRIG, CHOLHDL, LDLDIRECT in the last 72 hours. Thyroid Function Tests: No results for input(s): TSH, T4TOTAL, FREET4, T3FREE, THYROIDAB in the last 72 hours. Anemia Panel: No results for input(s): VITAMINB12, FOLATE, FERRITIN, TIBC, IRON, RETICCTPCT in the last 72 hours.   Urine analysis:    Component Value Date/Time   COLORURINE AMBER (A) 10/15/2018 1730   APPEARANCEUR HAZY (A) 10/15/2018 1730   LABSPEC 1.019 10/15/2018 1730   PHURINE 5.0 10/15/2018 1730   GLUCOSEU NEGATIVE 10/15/2018 1730   HGBUR NEGATIVE 10/15/2018 1730   HGBUR small 11/18/2009 0910   BILIRUBINUR NEGATIVE 10/15/2018 1730   BILIRUBINUR neg 10/25/2015 1141   KETONESUR NEGATIVE 10/15/2018 1730   PROTEINUR 30 (A) 10/15/2018  1730   UROBILINOGEN 0.2 10/25/2015 1141   UROBILINOGEN negative 11/18/2009 0910   NITRITE NEGATIVE 10/15/2018 1730   LEUKOCYTESUR NEGATIVE 10/15/2018 1730    Recent Results (from the past 240 hour(s))  SARS Coronavirus 2 [facility] order, Performed in [facility] hospital lab) Nasopharyngeal Nasopharyngeal Swab     Status: Abnormal   Collection Time: 10/15/18  5:10 PM   Specimen: Nasopharyngeal Swab  Result Value Ref Range Status   SARS Coronavirus 2 POSITIVE (A) NEGATIVE Final    Comment: RESULT CALLED TO, READ BACK BY AND VERIFIED WITH: Marzella Schlein RN 1910 10/15/18 A BROWNING (NOTE) If result is NEGATIVE SARS-CoV-2 target nucleic acids are NOT DETECTED. The SARS-CoV-2 RNA is generally detectable in upper and lower  respiratory specimens during the acute phase of infection. The lowest  concentration of SARS-CoV-2 viral copies this assay can detect is 250  copies / mL. A negative result does not preclude SARS-CoV-2 infection  and should not be used as the sole basis  for treatment or other  patient management decisions.  A negative result may occur with  improper specimen collection / handling, submission of specimen other  than nasopharyngeal swab, presence of viral mutation(s) within the  areas targeted by this assay, and inadequate number of viral copies  (<250 copies / mL). A negative result must be combined with clinical  observations, patient history, and epidemiological information. If result is POSITIVE SARS-CoV-2 target nucleic acids are DETECTED. T he SARS-CoV-2 RNA is generally detectable in upper and lower  respiratory specimens during the acute phase of infection.  Positive  results are indicative of active infection with SARS-CoV-2.  Clinical  correlation with patient history and other diagnostic information is  necessary to determine patient infection status.  Positive results do  not rule out bacterial infection or co-infection with other viruses. If result is  PRESUMPTIVE POSTIVE SARS-CoV-2 nucleic acids MAY BE PRESENT.   A presumptive positive result was obtained on the submitted specimen  and confirmed on repeat testing.  While 2019 novel coronavirus  (SARS-CoV-2) nucleic acids may be present in the submitted sample  additional confirmatory testing may be necessary for epidemiological  and / or clinical management purposes  to differentiate between  SARS-CoV-2 and other Sarbecovirus currently known to infect humans.  If clinically indicated additional testing with an alternate test  methodology [id number]) is  advised. The SARS-CoV-2 RNA is generally  detectable in upper and lower respiratory specimens during the acute  phase of infection. The expected result is Negative. Fact Sheet for Patients:  StrictlyIdeas.no Fact Sheet for Healthcare Providers: BankingDealers.co.za This test is not yet approved or cleared by the [place] FDA and has been authorized for detection and/or diagnosis of SARS-CoV-2 by FDA under an Emergency Use Authorization (EUA).  This EUA will remain in effect (meaning this test can be used) for the duration of the COVID-19 declaration under Section 564(b)(1) of the Act, 21 U.S.C. section 360bbb-3(b)(1), unless the authorization is terminated or revoked sooner. Performed at [facility], [place] [address], [place], [place] [address]   Blood Culture (routine x 2)     Status: Abnormal   Collection Time: 10/15/18  8:53 PM   Specimen: BLOOD  Result Value Ref Range Status   Specimen Description BLOOD RIGHT WRIST  Final   Special Requests   Final    BOTTLES DRAWN AEROBIC AND ANAEROBIC Blood Culture adequate volume   Culture  Setup Time   Final    GRAM POSITIVE RODS AEROBIC BOTTLE ONLY CRITICAL RESULT CALLED TO, READ BACK BY AND VERIFIED WITH: H.ZOMPA PHARMD, AT 1008 10/18/18 BY D. VANHOOK    Culture (A)  Final    DIPHTHEROIDS(CORYNEBACTERIUM SPECIES) Standardized  susceptibility testing for this organism is not available. Performed at [facility], [place] [address], [place], [place] [address]    Report Status 10/19/2018 FINAL  Final  Blood Culture (routine x 2)     Status: None   Collection Time: 10/15/18  9:18 PM   Specimen: BLOOD  Result Value Ref Range Status   Specimen Description BLOOD LEFT WRIST  Final   Special Requests   Final    BOTTLES DRAWN AEROBIC AND ANAEROBIC Blood Culture adequate volume   Culture   Final    NO GROWTH 5 DAYS Performed at [facility], [place] [address], [place], [place] [address]    Report Status 10/20/2018 FINAL  Final     Radiology Studies: No results found.  Scheduled Meds: . amLODipine  5 mg Oral Daily  . aspirin EC  81 mg Oral Daily  . atorvastatin  20 mg Oral QPM  . carvedilol  12.5 mg Oral BID WC  . enoxaparin (LOVENOX) injection  40 mg Subcutaneous Q24H  . influenza vaccine adjuvanted  0.5 mL Intramuscular Tomorrow-1000  . insulin aspart  0-20 Units Subcutaneous TID WC  . insulin aspart  0-5 Units Subcutaneous QHS  . insulin aspart  17 Units Subcutaneous TID WC  . insulin glargine  40 Units Subcutaneous BID  . mouth rinse  15 mL Mouth Rinse BID  . oxymetazoline  1 spray Each Nare BID  . Ensure Max Protein  11 oz Oral Daily  . sodium chloride flush  3 mL Intravenous Once   Continuous Infusions:   LOS: 10 days   Time spent: 35 minutes   Little Ishikawa, DO [facility] 10/25/2018, 9:11 AM   Please page through [place]:  [email or web address] Contact charge nurse for password If 7PM-7AM, please contact night-coverage

## 2018-10-25 NOTE — Progress Notes (Signed)
Walk test performed with pt. While pt at rest in chair O2 sats 93% on RA. Once pt up and out of room O2 sats maintained in 90s for 1st lap around nurses station. During 2nd lap, pt had to stop for a rest break and O2 sats dropped as low as 85% on RA. Pt placed on 2L [place] and O2 went as high as 87%, then pt increased to 4L [place] and sats increased as high as 96%. Pt back in room in the chair, on RA and sats in 90s. Will continue to monitor

## 2018-10-25 NOTE — Progress Notes (Signed)
Son, Konrad Dolores updated.

## 2018-10-26 LAB — GLUCOSE, CAPILLARY
Glucose-Capillary: 61 mg/dL — ABNORMAL LOW (ref 70–99)
Glucose-Capillary: 65 mg/dL — ABNORMAL LOW (ref 70–99)
Glucose-Capillary: 114 mg/dL — ABNORMAL HIGH (ref 70–99)
Glucose-Capillary: 226 mg/dL — ABNORMAL HIGH (ref 70–99)
Glucose-Capillary: 211 mg/dL — ABNORMAL HIGH (ref 70–99)
Glucose-Capillary: 74 mg/dL (ref 70–99)
Glucose-Capillary: 133 mg/dL — ABNORMAL HIGH (ref 70–99)
Glucose-Capillary: 140 mg/dL — ABNORMAL HIGH (ref 70–99)

## 2018-10-26 NOTE — Progress Notes (Signed)
Walk test performed. Pt started off on RA, sats at 93%. Pt walked halfway down the hall and sats decreased to 76% at the lowest. [place] turned on to 2L, sats at 80%. [place] then increased to 4L, sats at 85%. [place] then increased to 6L, sats increased to 96% at the highest. Pt stated he felt SOB and he felt that he was walking too fast. Pt then returned back to his room. Pt placed in the chair, on 2L [place], sats at 93%. Pt now on RA. Will continue to monitor

## 2018-10-26 NOTE — Progress Notes (Signed)
Son updated

## 2018-10-26 NOTE — Progress Notes (Addendum)
PROGRESS NOTE    Eddie Mcdaniel  [name] DOB: Apr 23, 1940 DOA: 10/15/2018 PCP: Ann Held, DO   Brief Narrative:  Eddie Mcdaniel is a 78 y.o. M with hx CAD s/p PCI x1 2017, HTN and DM who presented with 1 week fever, fatigue, malaise, and anosmia. In the ER, CXR showed multifocal pneumonia, was 89% on room air.  Assessment & Plan:   Coronavirus pneumonitis with acute hypoxic respiratory failure, POA - Transfused convalescent plasma on 9/19 - S/p remdesivir x5d - Walk test patient desatted to 76% initially requiring up to 6L to improve sats above 90%.  -Able to wean back to room air at rest after recovery - Dexamethasone to complete on 10/25/2018 - VTE PPx with Lovenox - Flutter valve, prone, incentive spirometry q2hrs while awake Recent Labs    10/25/18 0522  DDIMER 8.50*   Hypertension, essential Coronary disease secondary prevrention BP well controlled -Continue amlodipine, carvedilol -Continue aspirin, atorvastatin  Diabetes, non-insulin dependent Glucoses uncontrolled on steroids -Increase Lantus (40u BID) -Continue mealtime insulin - 17u -Continue SSI corrections -Hold metformin  Positive blood culture - Diphtheroids in 1/4 blood cultures is likely contaminant.  He has been stable without fever off antibiotics  MDM and disposition: The below labs and imaging reports were reviewed and summarized above.  Medication management as above. The patient was admitted with COVID-19.  He continues to desaturated to 80s with exertion, even despite supplemental O2.     DVT prophylaxis: Lovenox Code Status: FULL Family Communication: Son attempted to call x2; unable to leave voicemail  Procedures/Imaging:   CXR 9/18 -- bilateral pneumonia  CT chest 9/21 -- bilateral pneumonia, tracheal devation iwthout mass or hilar adenopathy  Antimicrobials:   Ceftriaxone x1  Azithromycin x1   Culture data:   9/16 blood culture 1/4 positive for diphtheroids  Subjective: No acute issues or events overnight; denies chest pain, headache, fevers, or chills.  She still complains of market dyspnea with even minimal exertion.  Objective: Vitals:   10/25/18 2005 10/26/18 0435 10/26/18 0815 10/26/18 1001  BP: (!) 104/56 140/79 134/76 (!) 122/53  Pulse: 71 63 62   Resp: 18  16   Temp: 98.3 F (36.8 C) 98 F (36.7 C)    TempSrc: Oral Oral Oral   SpO2: 95% 97% 93%   Weight:      Height:        Intake/Output Summary (Last 24 hours) at 10/26/2018 1149 Last data filed at 10/26/2018 0817 Gross per 24 hour  Intake 1200 ml  Output 675 ml  Net 525 ml   Filed Weights   10/16/18 0100 10/16/18 0309  Weight: 66.2 kg 63.4 kg    Examination:  General appearance: overweight adult male, alert and in no acute distress.  Sitting in recliner HEENT: Anicteric, conjunctiva pink, lids and lashes normal. No nasal deformity, discharge, epistaxis.  Lips moist, dentition normal, oropharynx moist, no oral lesions, hearing normal.   Skin: Warm and dry.   No suspicious rashes or lesions on face, neck, arms, legs. Cardiac: RRR, nl S1-S2, no murmurs appreciated. Capillary refill is brisk. JVP normal. No LE edema.  Radial pulses 2+ and symmetric. Respiratory: Respirations labored with exertion. Comfortable at rest. No wheezing or rales. Abdomen: Abdomen soft. No TTP or guarding. No ascites, distension, hepatosplenomegaly. MSK: No deformities or effusions. Neuro: Awake and alert.  EOMI, moves all extremities. Speech fluent.     Data Reviewed: I have personally reviewed following labs and imaging studies:  CBC:  Recent Labs  Lab 10/21/18 0310 10/23/18 0325 10/25/18 0522  WBC 15.1* 14.3* 18.7*  NEUTROABS 13.6*  --   --   HGB 11.0* 11.4* 12.9*  HCT 31.6* 33.6* 38.2*  MCV 85.9 87.5 88.0  PLT 278 277 [id number]   Basic Metabolic Panel: Recent Labs  Lab 10/21/18 0310 10/23/18 0325 10/25/18 0522  NA 132* 134* 135  K 4.1 4.4 4.9  CL 99 101 101  CO2 24 26 25    GLUCOSE 283* 175* 127*  BUN 27* 28* 24*  CREATININE 0.70 0.59* 0.73  CALCIUM 8.2* 8.3* 8.8*   GFR: Estimated Creatinine Clearance: 68.3 mL/min (by C-G formula based on SCr of 0.73 mg/dL). Liver Function Tests: Recent Labs  Lab 10/21/18 0310 10/23/18 0325 10/25/18 0522  AST 22 22 38  ALT 23 26 62*  ALKPHOS 53 51 54  BILITOT 0.6 0.7 0.8  PROT 5.7* 5.3* 5.9*  ALBUMIN 2.6* 2.5* 2.7*   No results for input(s): LIPASE, AMYLASE in the last 168 hours. No results for input(s): AMMONIA in the last 168 hours. Coagulation Profile: No results for input(s): INR, PROTIME in the last 168 hours. Cardiac Enzymes: No results for input(s): CKTOTAL, CKMB, CKMBINDEX, TROPONINI in the last 168 hours. BNP (last 3 results) No results for input(s): PROBNP in the last 8760 hours. HbA1C: No results for input(s): HGBA1C in the last 72 hours. CBG: Recent Labs  Lab 10/25/18 2141 10/26/18 0807 10/26/18 0821 10/26/18 0840 10/26/18 0955  GLUCAP 183* 65* 61* 114* 226*   Lipid Profile: No results for input(s): CHOL, HDL, LDLCALC, TRIG, CHOLHDL, LDLDIRECT in the last 72 hours. Thyroid Function Tests: No results for input(s): TSH, T4TOTAL, FREET4, T3FREE, THYROIDAB in the last 72 hours. Anemia Panel: No results for input(s): VITAMINB12, FOLATE, FERRITIN, TIBC, IRON, RETICCTPCT in the last 72 hours.   Urine analysis:    Component Value Date/Time   COLORURINE AMBER (A) 10/15/2018 1730   APPEARANCEUR HAZY (A) 10/15/2018 1730   LABSPEC 1.019 10/15/2018 1730   PHURINE 5.0 10/15/2018 1730   GLUCOSEU NEGATIVE 10/15/2018 1730   HGBUR NEGATIVE 10/15/2018 1730   HGBUR small 11/18/2009 0910   BILIRUBINUR NEGATIVE 10/15/2018 1730   BILIRUBINUR neg 10/25/2015 1141   KETONESUR NEGATIVE 10/15/2018 1730   PROTEINUR 30 (A) 10/15/2018 1730   UROBILINOGEN 0.2 10/25/2015 1141   UROBILINOGEN negative 11/18/2009 0910   NITRITE NEGATIVE 10/15/2018 1730   LEUKOCYTESUR NEGATIVE 10/15/2018 1730    No results  found for this or any previous visit (from the past 240 hour(s)).   Radiology Studies: No results found.  Scheduled Meds: . amLODipine  5 mg Oral Daily  . aspirin EC  81 mg Oral Daily  . atorvastatin  20 mg Oral QPM  . carvedilol  12.5 mg Oral BID WC  . enoxaparin (LOVENOX) injection  40 mg Subcutaneous Q24H  . influenza vaccine adjuvanted  0.5 mL Intramuscular Tomorrow-1000  . insulin aspart  0-20 Units Subcutaneous TID WC  . insulin aspart  0-5 Units Subcutaneous QHS  . insulin aspart  17 Units Subcutaneous TID WC  . insulin glargine  40 Units Subcutaneous BID  . mouth rinse  15 mL Mouth Rinse BID  . oxymetazoline  1 spray Each Nare BID  . Ensure Max Protein  11 oz Oral Daily  . sodium chloride flush  3 mL Intravenous Once   Continuous Infusions:   LOS: 11 days   Time spent: 35 minutes   Little Ishikawa, DO [facility] 10/26/2018, 11:49 AM  Please page through AMION:  [email or web address] Contact charge nurse for password If 7PM-7AM, please contact night-coverage

## 2018-10-27 LAB — GLUCOSE, CAPILLARY
Glucose-Capillary: 202 mg/dL — ABNORMAL HIGH (ref 70–99)
Glucose-Capillary: 49 mg/dL — ABNORMAL LOW (ref 70–99)
Glucose-Capillary: 101 mg/dL — ABNORMAL HIGH (ref 70–99)
Glucose-Capillary: 320 mg/dL — ABNORMAL HIGH (ref 70–99)
Glucose-Capillary: 25 mg/dL — CL (ref 70–99)
Glucose-Capillary: 226 mg/dL — ABNORMAL HIGH (ref 70–99)
Glucose-Capillary: 94 mg/dL (ref 70–99)
Glucose-Capillary: 179 mg/dL — ABNORMAL HIGH (ref 70–99)
Glucose-Capillary: 211 mg/dL — ABNORMAL HIGH (ref 70–99)
Glucose-Capillary: 158 mg/dL — ABNORMAL HIGH (ref 70–99)

## 2018-10-27 LAB — CBC
HCT: 33.5 % — ABNORMAL LOW (ref 39.0–52.0)
Hemoglobin: 11.4 g/dL — ABNORMAL LOW (ref 13.0–17.0)
MCH: 30.2 pg (ref 26.0–34.0)
MCHC: 34 g/dL (ref 30.0–36.0)
MCV: 88.6 fL (ref 80.0–100.0)
Platelets: 219 10*3/uL (ref 150–400)
RBC: 3.78 MIL/uL — ABNORMAL LOW (ref 4.22–5.81)
RDW: 13.5 % (ref 11.5–15.5)
WBC: 16.5 10*3/uL — ABNORMAL HIGH (ref 4.0–10.5)
nRBC: 0 % (ref 0.0–0.2)

## 2018-10-27 LAB — COMPREHENSIVE METABOLIC PANEL
ALT: 70 U/L — ABNORMAL HIGH (ref 0–44)
AST: 34 U/L (ref 15–41)
Albumin: 2.3 g/dL — ABNORMAL LOW (ref 3.5–5.0)
Alkaline Phosphatase: 49 U/L (ref 38–126)
Anion gap: 14 (ref 5–15)
BUN: 27 mg/dL — ABNORMAL HIGH (ref 8–23)
CO2: 20 mmol/L — ABNORMAL LOW (ref 22–32)
Calcium: 8.2 mg/dL — ABNORMAL LOW (ref 8.9–10.3)
Chloride: 95 mmol/L — ABNORMAL LOW (ref 98–111)
Creatinine, Ser: 0.6 mg/dL — ABNORMAL LOW (ref 0.61–1.24)
GFR calc Af Amer: >60 mL/min (ref >60)
GFR calc non Af Amer: >60 mL/min (ref >60)
Glucose, Bld: 113 mg/dL — ABNORMAL HIGH (ref 70–99)
Potassium: 4.7 mmol/L (ref 3.5–5.1)
Sodium: 129 mmol/L — ABNORMAL LOW (ref 135–145)
Total Bilirubin: 0.7 mg/dL (ref 0.3–1.2)
Total Protein: 5 g/dL — ABNORMAL LOW (ref 6.5–8.1)

## 2018-10-27 LAB — D-DIMER, QUANTITATIVE: D-Dimer, Quant: 3.86 ug/mL-FEU — ABNORMAL HIGH (ref 0.00–0.50)

## 2018-10-27 MED ORDER — INSULIN GLARGINE 100 UNIT/ML ~~LOC~~ SOLN
40.0000 [IU] | Freq: Every day | SUBCUTANEOUS | Status: DC
  Filled 2018-10-27: qty 0.4

## 2018-10-27 MED ORDER — DEXTROSE 50 % IV SOLN: 50.0000 mL | Freq: Once | INTRAVENOUS | Status: AC

## 2018-10-27 MED ORDER — DEXTROSE 50 % IV SOLN: 1.0000 | INTRAVENOUS | Status: DC | PRN

## 2018-10-27 MED ORDER — DEXTROSE 50 % IV SOLN
INTRAVENOUS | Status: AC
  Administered 2018-10-27: 50 mL
  Filled 2018-10-27: qty 50

## 2018-10-27 NOTE — Progress Notes (Signed)
PROGRESS NOTE    Eddie Mcdaniel  [id number] DOB: 08/12/1940 DOA: 10/15/2018 PCP: Ann Held, DO   Brief Narrative:  Eddie Mcdaniel is a 78 y.o. M with hx CAD s/p PCI x1 2017, HTN and DM who presented with 1 week fever, fatigue, malaise, and anosmia. In the ER, CXR showed multifocal pneumonia, was 89% on room air.  Assessment & Plan:   Coronavirus pneumonitis with acute hypoxic respiratory failure, POA - Transfused convalescent plasma on 9/19 - S/p remdesivir x5d - Walk test patient desatted to 76% initially requiring up to 6L to improve sats above 90%.  - Able to wean back to room air at rest after recovery - Dexamethasone completed on 10/25/2018 - VTE PPx with Lovenox - Flutter valve, prone, incentive spirometry q2hrs while awake Recent Labs    10/25/18 0522 10/27/18 0315  DDIMER 8.50* 3.86*   Hypertension, essential Coronary disease secondary prevrention - BP well controlled - Continue amlodipine, carvedilol - Continue aspirin, atorvastatin  Diabetes, non-insulin dependent -Hypoglycemic event early morning, resolved with p.o. intake -Use Lantus to 40 units every morning, previously twice daily - Discontinue pre-meal insulin - Continue SSI corrections - Hold metformin  Positive blood culture - Diphtheroids in 1/4 blood cultures is likely contaminant.  He has been stable without fever off antibiotics  MDM and disposition: The below labs and imaging reports were reviewed and summarized above.  Medication management as above. The patient was admitted with COVID-19.  He continues to desaturated to 80s with exertion, even despite supplemental O2.    DVT prophylaxis: Lovenox Code Status: FULL Family Communication: Son spoken to at [employer] over phone; Russians answered, updated on current gnosis and likely disposition in the next 24 to 48 hours  Procedures/Imaging:  - CXR 9/18 - bilateral pneumonia - CT Chest 9/21 - bilateral pneumonia, tracheal devation  iwthout mass or hilar adenopathy  Antimicrobials:  - Ceftriaxone x1 - Azithromycin x1   Culture data:  - 9/16 blood culture 1/4 positive for diphtheroids  Subjective: Mild hypoglycemia this morning, asymptomatic.  Patient otherwise indicates he feels much better, off oxygen at rest, exertional dyspnea is improving.  Denies chest pain, headache, fevers, chills, nausea, vomiting, diarrhea, constipation.  Objective: Vitals:   10/26/18 1546 10/26/18 2000 10/27/18 0500 10/27/18 0815  BP: 133/71 (!) 106/53 128/62 132/68  Pulse: 61   62  Resp: 18   18  Temp: 98.8 F (37.1 C) 98.9 F (37.2 C) 98.7 F (37.1 C) 97.9 F (36.6 C)  TempSrc: Oral Oral Oral Oral  SpO2: 91%   93%  Weight:      Height:        Intake/Output Summary (Last 24 hours) at 10/27/2018 1345 Last data filed at 10/27/2018 0800 Gross per 24 hour  Intake 1800 ml  Output 1125 ml  Net 675 ml   Filed Weights   10/16/18 0100 10/16/18 0309  Weight: 66.2 kg 63.4 kg    Examination:  General appearance: overweight adult male, alert and in no acute distress.  Sitting in recliner HEENT: Anicteric, conjunctiva pink, lids and lashes normal. No nasal deformity, discharge, epistaxis.  Lips moist, dentition normal, oropharynx moist, no oral lesions, hearing normal.   Skin: Warm and dry.   No suspicious rashes or lesions on face, neck, arms, legs. Cardiac: RRR, nl S1-S2, no murmurs appreciated. Capillary refill is brisk. JVP normal. No LE edema.  Radial pulses 2+ and symmetric. Respiratory: Respirations labored with exertion. Comfortable at rest. No wheezing or rales.  Abdomen: Abdomen soft. No TTP or guarding. No ascites, distension, hepatosplenomegaly. MSK: No deformities or effusions. Neuro: Awake and alert.  EOMI, moves all extremities. Speech fluent.     Data Reviewed: I have personally reviewed following labs and imaging studies:  CBC: Recent Labs  Lab 10/21/18 0310 10/23/18 0325 10/25/18 0522 10/27/18 0315  WBC  15.1* 14.3* 18.7* 16.5*  NEUTROABS 13.6*  --   --   --   HGB 11.0* 11.4* 12.9* 11.4*  HCT 31.6* 33.6* 38.2* 33.5*  MCV 85.9 87.5 88.0 88.6  PLT 278 277 295 [id number]   Basic Metabolic Panel: Recent Labs  Lab 10/21/18 0310 10/23/18 0325 10/25/18 0522 10/27/18 0315  NA 132* 134* 135 129*  K 4.1 4.4 4.9 4.7  CL 99 101 101 95*  CO2 24 26 25  20*  GLUCOSE 283* 175* 127* 113*  BUN 27* 28* 24* 27*  CREATININE 0.70 0.59* 0.73 0.60*  CALCIUM 8.2* 8.3* 8.8* 8.2*   GFR: Estimated Creatinine Clearance: 68.3 mL/min (A) (by C-G formula based on SCr of 0.6 mg/dL (L)).   Liver Function Tests: Recent Labs  Lab 10/21/18 0310 10/23/18 0325 10/25/18 0522 10/27/18 0315  AST 22 22 38 34  ALT 23 26 62* 70*  ALKPHOS 53 51 54 49  BILITOT 0.6 0.7 0.8 0.7  PROT 5.7* 5.3* 5.9* 5.0*  ALBUMIN 2.6* 2.5* 2.7* 2.3*   CBG: Recent Labs  Lab 10/26/18 1758 10/26/18 2039 10/27/18 0823 10/27/18 0844 10/27/18 1132  GLUCAP 133* 140* 49* 101* 320*   Urine analysis:    Component Value Date/Time   COLORURINE AMBER (A) 10/15/2018 1730   APPEARANCEUR HAZY (A) 10/15/2018 1730   LABSPEC 1.019 10/15/2018 1730   PHURINE 5.0 10/15/2018 1730   GLUCOSEU NEGATIVE 10/15/2018 1730   HGBUR NEGATIVE 10/15/2018 1730   HGBUR small 11/18/2009 0910   BILIRUBINUR NEGATIVE 10/15/2018 1730   BILIRUBINUR neg 10/25/2015 1141   KETONESUR NEGATIVE 10/15/2018 1730   PROTEINUR 30 (A) 10/15/2018 1730   UROBILINOGEN 0.2 10/25/2015 1141   UROBILINOGEN negative 11/18/2009 0910   NITRITE NEGATIVE 10/15/2018 1730   LEUKOCYTESUR NEGATIVE 10/15/2018 1730    No results found for this or any previous visit (from the past 240 hour(s)).   Radiology Studies: No results found.  Scheduled Meds: . amLODipine  5 mg Oral Daily  . aspirin EC  81 mg Oral Daily  . atorvastatin  20 mg Oral QPM  . carvedilol  12.5 mg Oral BID WC  . enoxaparin (LOVENOX) injection  40 mg Subcutaneous Q24H  . influenza vaccine adjuvanted  0.5 mL  Intramuscular Tomorrow-1000  . insulin aspart  0-20 Units Subcutaneous TID WC  . insulin aspart  0-5 Units Subcutaneous QHS  . insulin aspart  17 Units Subcutaneous TID WC  . insulin glargine  40 Units Subcutaneous BID  . mouth rinse  15 mL Mouth Rinse BID  . oxymetazoline  1 spray Each Nare BID  . Ensure Max Protein  11 oz Oral Daily  . sodium chloride flush  3 mL Intravenous Once   Continuous Infusions:   LOS: 12 days   Time spent: 35 minutes   Little Ishikawa, DO [facility] 10/27/2018, 1:45 PM   Please page through [place]:  [email or web address] Contact charge nurse for password If 7PM-7AM, please contact night-coverage

## 2018-10-27 NOTE — Progress Notes (Signed)
Spoke with patient son Konrad Dolores via phone reviewed POC and status.

## 2018-10-27 NOTE — Progress Notes (Signed)
SATURATION QUALIFICATIONS: (This note is used to comply with regulatory documentation for home oxygen)  Patient Saturations on Room Air at Rest = 90%  Patient Saturations on Room Air while Ambulating = 76%  Patient Saturations on 4, then 6 Liters of oxygen while Ambulating = 89%  Please briefly explain why patient needs home oxygen: To maintain oxygenation >87% while completing functional mobility    Barry Brunner, PT

## 2018-10-27 NOTE — Progress Notes (Signed)
Physical Therapy Treatment  Patient on room air on arrival with sats >90%. Upon standing, sats quickly decreased to 85% and unable to bring sats up with pursed lip breathing. Applied 2L of O2 and sats up to 90%, however as he began walking he again quickly desaturated and required 4-6L of O2 to ambulate and keep sats >87%. Required cues for standing rest breaks, slower pace, and pursed lip breathing to keep sats >87%. Overall, moving better and requiring less oxygen than when last seen by PT.    10/27/18 1359  PT Visit Information  Last PT Received On 10/27/18  Assistance Needed +1  History of Present Illness 78 y.o. male,  w hypertension, hyperlipidemia, Dm2, CAD, rt rotator cuff repair x2 apparently presents with c/o feeling sick, fever last week, and has had poor po intake and poor energy,  and odd bitter taste in his mouth. Pt denies any travel or covid exposure. Pt admitted 10/15/18 for acute respiratory failure with hypoxia secondary to Covid -19, CAP; +lt pleural effusion  Subjective Data  Patient Stated Goal get better and return home  Precautions  Precautions Fall;Other (comment)  Precaution Comments monitor O2  Restrictions  Weight Bearing Restrictions No  Pain Assessment  Pain Assessment No/denies pain  Cognition  Arousal/Alertness Awake/alert  Behavior During Therapy WFL for tasks assessed/performed  Overall Cognitive Status No family/caregiver present to determine baseline cognitive functioning  General Comments states (without PT asking) that he is using IS 15-20 times/hour now  Transfers  Overall transfer level Needs assistance  Equipment used None  Transfers Sit to/from Stand  Sit to Stand Supervision  Stand pivot transfers Supervision  General transfer comment for safety, lines, vc for breathing  Ambulation/Gait  Ambulation/Gait assistance Min assist  Gait Distance (Feet) 100 Feet (standing rest, 50 standing rest, 50 return to chair)  Assistive device  (pt pushing  O2 tank (he insisted))  Gait Pattern/deviations Step-through pattern;Decreased stride length;Staggering right  General Gait Details on room air initially, had to incr to 6L while walking  Gait velocity vc for slower pace to maintain sats and decr RR  Balance  Overall balance assessment Needs assistance  Sitting-balance support Feet supported;No upper extremity supported  Sitting balance-Leahy Scale Normal  Standing balance support No upper extremity supported  Standing balance-Leahy Scale Fair  Exercises  Exercises Other exercises  Other Exercises  Other Exercises sit to stand from recliner x 5 reps without use of UEs to raise or lower; focus on slow and controlled descent; required rest after 3rd and 4th reps  PT - End of Session  Equipment Utilized During Treatment Oxygen  Activity Tolerance Treatment limited secondary to medical complications (Comment) (desats)  Patient left with call bell/phone within reach;in chair  Nurse Communication Mobility status;Other (comment) (decreased sats; on 2L at end of session)   PT - Assessment/Plan  PT Plan Current plan remains appropriate  PT Visit Diagnosis Unsteadiness on feet (R26.81);Difficulty in walking, not elsewhere classified (R26.2)  PT Frequency (ACUTE ONLY) Min 3X/week  Follow Up Recommendations Home health PT;Supervision/Assistance - 24 hour  PT equipment None recommended by PT  AM-PAC PT "6 Clicks" Mobility Outcome Measure (Version 2)  Help needed turning from your back to your side while in a flat bed without using bedrails? 4  Help needed moving from lying on your back to sitting on the side of a flat bed without using bedrails? 3  Help needed moving to and from a bed to a chair (including a wheelchair)? 3  Help  needed standing up from a chair using your arms (e.g., wheelchair or bedside chair)? 3  Help needed to walk in hospital room? 3  Help needed climbing 3-5 steps with a railing?  2  6 Click Score 18  Consider  Recommendation of Discharge To: Home with [facility]  PT Goal Progression  Progress towards PT goals Progressing toward goals  Acute Rehab PT Goals  Time For Goal Achievement 10/31/18  Potential to Achieve Goals Good  PT Time Calculation  PT Start Time (ACUTE ONLY) 1044  PT Stop Time (ACUTE ONLY) 1109  PT Time Calculation (min) (ACUTE ONLY) 25 min  PT General Charges  $$ ACUTE PT VISIT 1 Visit  PT Treatments  $Gait Training 23-37 mins     Barry Brunner, PT

## 2018-10-27 NOTE — Progress Notes (Signed)
Inpatient Diabetes Program Recommendations  AACE/ADA: New Consensus Statement on Inpatient Glycemic Control (2015)  Target Ranges:  Prepandial:   less than 140 mg/dL      Peak postprandial:   less than 180 mg/dL (1-2 hours)      Critically ill patients:  140 - 180 mg/dL   Lab Results  Component Value Date   GLUCAP 101 (H) 10/27/2018   HGBA1C 6.5 05/07/2017    Review of Glycemic Control Results for DELNO, IRIAS" (MRN [id number]) as of 10/27/2018 11:50  Ref. Range 10/26/2018 17:58 10/26/2018 20:39 10/27/2018 08:23 10/27/2018 08:44  Glucose-Capillary Latest Ref Range: 70 - 99 mg/dL 133 (H) 140 (H) 49 (L) 101 (H)   Diabetes history: Type 2 DM Outpatient Diabetes medications: Metformin 750 mg QD Current orders for Inpatient glycemic control: Lantus 40 units BID, Novolog 17 units TID, Novolog 0-20 units TID, Novolog 0-5 units QHS  Inpatient Diabetes Program Recommendations:    Noted hypoglycemia this AM of 49 mg/dL and steroids tapered without change to insulin regimen.   Recommend:  -Decreasing Meal coverage to Novolog 10 units TID (assuming patient is consuming >50% of meal and to be tapered once steroids are decreased) -Decrease Lantus to 40 units QD  Thanks, Bronson Curb, MSN, RNC-OB Diabetes Coordinator [phone] (8a-5p)

## 2018-10-28 LAB — GLUCOSE, CAPILLARY
Glucose-Capillary: 127 mg/dL — ABNORMAL HIGH (ref 70–99)
Glucose-Capillary: 148 mg/dL — ABNORMAL HIGH (ref 70–99)
Glucose-Capillary: 65 mg/dL — ABNORMAL LOW (ref 70–99)
Glucose-Capillary: 238 mg/dL — ABNORMAL HIGH (ref 70–99)
Glucose-Capillary: 240 mg/dL — ABNORMAL HIGH (ref 70–99)
Glucose-Capillary: 317 mg/dL — ABNORMAL HIGH (ref 70–99)

## 2018-10-28 LAB — C-REACTIVE PROTEIN: CRP: 7.9 mg/dL — ABNORMAL HIGH (ref <1.0)

## 2018-10-28 NOTE — Progress Notes (Signed)
Called patients son Konrad Dolores discussed POC and status.

## 2018-10-28 NOTE — Progress Notes (Signed)
Updated given to son Colbie Odoms via phone # [phone].

## 2018-10-28 NOTE — Progress Notes (Signed)
Occupational Therapy Treatment Patient Details Name: Eddie Mcdaniel MRN: [id number] DOB: [birth year] Today's Date: 10/28/2018    History of present illness 78 y.o. male,  w hypertension, hyperlipidemia, Dm2, CAD, rt rotator cuff repair x2 apparently presents with c/o feeling sick, fever last week, and has had poor po intake and poor energy,  and odd bitter taste in his mouth. Pt denies any travel or covid exposure. Pt admitted 10/15/18 for acute respiratory failure with hypoxia secondary to Covid -19, CAP; +lt pleural effusion   OT comments  Pt completed ADL session in bathroom at sink level on 4L with S/ minguard A. Required min A x 1 for balance recovery during mobility to toilet. SpO2 91 end of session with 2/4 DOE at times. Educating pt on energy conservation and pursed lip breathing throughout session. Making good progress. Wife called and updated on end of session. Will continue to follow acutely. Pt very appreciative.   Follow Up Recommendations  Home health OT;Supervision/Assistance - 24 hour    Equipment Recommendations  3 in 1 bedside commode(to use as shower seat)    Recommendations for Other Services      Precautions / Restrictions Precautions Precautions: Fall;Other (comment) Precaution Comments: monitor O2       Mobility Bed Mobility Overal bed mobility: Modified Independent                Transfers Overall transfer level: Needs assistance   Transfers: Sit to/from Stand;Stand Pivot Transfers Sit to Stand: Supervision Stand pivot transfers: Min guard            Balance     Sitting balance-Leahy Scale: Good       Standing balance-Leahy Scale: Fair                             ADL either performed or assessed with clinical judgement   ADL Overall ADL's : Needs assistance/impaired     Grooming: Sitting;Standing;Supervision/safety   Upper Body Bathing: Set up;Sitting   Lower Body Bathing: Min guard;Sit to/from stand   Upper  Body Dressing : Set up;Sitting   Lower Body Dressing: Min guard;Sit to/from stand   Toilet Transfer: Minimal assistance;Comfort height toilet;Ambulation Toilet Transfer Details (indicate cue type and reason): LOB with side step adn min A from therapist to recover [place] and Hygiene: Min guard       Functional mobility during ADLs: Minimal assistance(without AD) General ADL Comments: Working on energy conservation strategies and pursed lip breathing during ADL.      Vision       Perception     Praxis      Cognition Arousal/Alertness: Awake/alert Behavior During Therapy: WFL for tasks assessed/performed Overall Cognitive Status: No family/caregiver present to determine baseline cognitive functioning                                          Exercises Other Exercises Other Exercises: incentive spirometer x 10. Able to pull 750 ml   Shoulder Instructions       General Comments      Pertinent Vitals/ Pain       Pain Assessment: No/denies pain  Home Living  Prior Functioning/Environment              Frequency  Min 3X/week        Progress Toward Goals  OT Goals(current goals can now be found in the care plan section)  Progress towards OT goals: Progressing toward goals  Acute Rehab OT Goals Patient Stated Goal: get better and return home OT Goal Formulation: With patient Time For Goal Achievement: 10/31/18 Potential to Achieve Goals: Good ADL Goals Pt Will Perform Grooming: with modified independence;standing Pt Will Perform Lower Body Bathing: with modified independence;sit to/from stand Pt Will Perform Lower Body Dressing: with modified independence;sit to/from stand Pt Will Transfer to Toilet: with modified independence;ambulating Pt Will Perform Toileting - Clothing Manipulation and hygiene: with modified independence;sit to/from stand Pt/caregiver  will Perform Home Exercise Program: Increased strength;Both right and left upper extremity;With theraband;With written HEP provided;Independently Additional ADL Goal #1: Pt will independently verbalize/demonstrate at least 3 energy conservation techniques during functional task.  Plan Discharge plan remains appropriate;Frequency needs to be updated    Co-evaluation                 AM-PAC OT "6 Clicks" Daily Activity     Outcome Measure   Help from another person eating meals?: None Help from another person taking care of personal grooming?: A Little Help from another person toileting, which includes using toliet, bedpan, or urinal?: A Little Help from another person bathing (including washing, rinsing, drying)?: A Little Help from another person to put on and taking off regular upper body clothing?: A Little Help from another person to put on and taking off regular lower body clothing?: A Little 6 Click Score: 19    End of Session Equipment Utilized During Treatment: Oxygen(4L during mobility)  OT Visit Diagnosis: Muscle weakness (generalized) (M62.81);Other (comment)   Activity Tolerance Patient tolerated treatment well   Patient Left in chair;with call bell/phone within reach   Nurse Communication Mobility status        Time: 1520-1600 OT Time Calculation (min): 40 min  Charges: OT General Charges $OT Visit: 1 Visit OT Treatments $Self Care/Home Management : 38-52 mins  Maurie Boettcher, OT/L   Acute OT Clinical Specialist [place] Pager [phone] Office [phone]    [facility] 10/28/2018, 4:24 PM

## 2018-10-28 NOTE — Progress Notes (Signed)
CCMD notified writer patient's oxygen saturations in the high 80's, writer observed  urinal with clear yellow urine sitting at bedside patient advised he had just stood up to urinate. Oxygen sats reading 96% on bedside monitor.

## 2018-10-28 NOTE — Progress Notes (Addendum)
PROGRESS NOTE    Eddie Mcdaniel  [name] DOB: [birth year] DOA: 10/15/2018 PCP: Ann Held, DO   Brief Narrative:  [name] is a 78 y.o. M with hx CAD s/p PCI x1 2017, HTN and DM who presented with 1 week fever, fatigue, malaise, and anosmia. In the ER, CXR showed multifocal pneumonia, acutely hypoxic on room air.  Assessment & Plan:   Coronavirus pneumonitis with acute hypoxic respiratory failure, POA - Transfused convalescent plasma on 9/19 - S/p remdesivir x5d - Walk test patient desatted to 76% initially requiring up to 6L with sats only improving to 89%  -Patient on room air this morning at rest at 94%, patient does desaturate to 89% while talking - Dexamethasone completed on 10/25/2018 - VTE PPx with Lovenox - Flutter valve, prone, incentive spirometry q2hrs while awake -Follow D-dimer/CRP - consider Actemra vs eval for DVT/PE if patient decompensates or worsening labs -D-dimer has been downtrending over the past week appropriately - down to 3.86 today Recent Labs    10/27/18 0315  DDIMER 3.86*    Hypertension, essential Coronary disease secondary prevrention - BP well controlled - Continue amlodipine, carvedilol - Continue aspirin, atorvastatin  Diabetes, non-insulin dependent, markedly uncontrolled -Patient becomes hypoglycemic receives D50/ice cream/fruit juice/sugary foods by staff then becomes hyperglycemic requiring sliding scale only rebounding to hypoglycemia -moderately poor p.o. intake moderately exacerbating hypoglycemia -Stop all insulin x24h, follow point-of-care glucose over the next 24 hours and reinitiate insulin as necessary - Hold metformin  Positive blood culture - Diphtheroids in 1/4 blood cultures is likely contaminant.  He has been stable without fever off antibiotics  MDM and disposition: Patient continues to recover from COVID-19 pneumonia, with hypoxia ongoing, worse with exertion, minimally improving daily.  Patient  now completed steroid course, antiviral, plasma.  At this time would consider disposition to skilled nursing facility given ongoing need for moderate amount of oxygen, upwards of 6 L with ambulation likely will need long taper as well as likely PT given patient's reported weakness.  DVT prophylaxis: Lovenox Code Status: FULL Family Communication: Son spoken to at length over phone; all questions answered, updated on current prognosis and possible disposition  Procedures/Imaging:  - CXR 9/18 - bilateral pneumonia - CT Chest 9/21 - bilateral pneumonia, tracheal devation without mass or hilar adenopathy  Antimicrobials:  - Ceftriaxone x1 - Azithromycin x1   Culture data:  - 9/16 blood culture 1/4 positive for diphtheroids  Subjective: Patient continues to have hypoglycemic events, feels tired and generally weak.  Patient weaned off of oxygen to room air, without dyspnea, hypoxia or other complaints.  Otherwise denies chest pain, headache, fevers, chills, nausea, vomiting, diarrhea, constipation.  Objective: Vitals:   10/27/18 1637 10/27/18 1938 10/28/18 0442 10/28/18 0700  BP:  (!) 107/56 131/63 125/65  Pulse: 80 68 69 66  Resp:  15 16 18   Temp:  98.2 F (36.8 C) 98.4 F (36.9 C) 97.6 F (36.4 C)  TempSrc:  Oral Oral Oral  SpO2: 91% 96% 96% 95%  Weight:      Height:        Intake/Output Summary (Last 24 hours) at 10/28/2018 1122 Last data filed at 10/28/2018 0800 Gross per 24 hour  Intake 460 ml  Output 1675 ml  Net -1215 ml   Filed Weights   10/16/18 0100 10/16/18 0309  Weight: 66.2 kg 63.4 kg    Examination:  General:  Pleasantly resting in bed, No acute distress. HEENT:  Normocephalic atraumatic.  Sclerae nonicteric,  noninjected.  Extraocular movements intact bilaterally. Neck:  Without mass or deformity.  Trachea is midline. Lungs:  Clear to auscultate bilaterally without rhonchi, wheeze, or rales. Heart:  Regular rate and rhythm.  Without murmurs, rubs, or  gallops. Abdomen:  Soft, nontender, nondistended.  Without guarding or rebound. Extremities: Without cyanosis, clubbing, edema, or obvious deformity. Vascular:  Dorsalis pedis and posterior tibial pulses palpable bilaterally. Skin:  Warm and dry, no erythema, no ulcerations.   Data Reviewed: I have personally reviewed following labs and imaging studies:  CBC: Recent Labs  Lab 10/23/18 0325 10/25/18 0522 10/27/18 0315  WBC 14.3* 18.7* 16.5*  HGB 11.4* 12.9* 11.4*  HCT 33.6* 38.2* 33.5*  MCV 87.5 88.0 88.6  PLT 277 295 [id number]   Basic Metabolic Panel: Recent Labs  Lab 10/23/18 0325 10/25/18 0522 10/27/18 0315  NA 134* 135 129*  K 4.4 4.9 4.7  CL 101 101 95*  CO2 26 25 20*  GLUCOSE 175* 127* 113*  BUN 28* 24* 27*  CREATININE 0.59* 0.73 0.60*  CALCIUM 8.3* 8.8* 8.2*   GFR: Estimated Creatinine Clearance: 68.3 mL/min (A) (by C-G formula based on SCr of 0.6 mg/dL (L)).   Liver Function Tests: Recent Labs  Lab 10/23/18 0325 10/25/18 0522 10/27/18 0315  AST 22 38 34  ALT 26 62* 70*  ALKPHOS 51 54 49  BILITOT 0.7 0.8 0.7  PROT 5.3* 5.9* 5.0*  ALBUMIN 2.5* 2.7* 2.3*   CBG: Recent Labs  Lab 10/27/18 1926 10/27/18 2122 10/28/18 0310 10/28/18 0750 10/28/18 0824  GLUCAP 211* 158* 127* 65* 148*   Urine analysis:    Component Value Date/Time   COLORURINE AMBER (A) 10/15/2018 1730   APPEARANCEUR HAZY (A) 10/15/2018 1730   LABSPEC 1.019 10/15/2018 1730   PHURINE 5.0 10/15/2018 1730   GLUCOSEU NEGATIVE 10/15/2018 1730   HGBUR NEGATIVE 10/15/2018 1730   HGBUR small 11/18/2009 0910   BILIRUBINUR NEGATIVE 10/15/2018 1730   BILIRUBINUR neg 10/25/2015 1141   KETONESUR NEGATIVE 10/15/2018 1730   PROTEINUR 30 (A) 10/15/2018 1730   UROBILINOGEN 0.2 10/25/2015 1141   UROBILINOGEN negative 11/18/2009 0910   NITRITE NEGATIVE 10/15/2018 1730   LEUKOCYTESUR NEGATIVE 10/15/2018 1730    No results found for this or any previous visit (from the past 240 hour(s)).    Radiology Studies: No results found.  Scheduled Meds: . amLODipine  5 mg Oral Daily  . aspirin EC  81 mg Oral Daily  . atorvastatin  20 mg Oral QPM  . carvedilol  12.5 mg Oral BID WC  . enoxaparin (LOVENOX) injection  40 mg Subcutaneous Q24H  . influenza vaccine adjuvanted  0.5 mL Intramuscular Tomorrow-1000  . mouth rinse  15 mL Mouth Rinse BID  . oxymetazoline  1 spray Each Nare BID  . Ensure Max Protein  11 oz Oral Daily  . sodium chloride flush  3 mL Intravenous Once   Continuous Infusions:   LOS: 13 days   Time spent: 35 minutes   Little Ishikawa, DO [facility] 10/28/2018, 11:22 AM   Please page through [place]:  [email or web address] Contact charge nurse for password If 7PM-7AM, please contact night-coverage

## 2018-10-28 NOTE — Progress Notes (Signed)
Inpatient Diabetes Program Recommendations  AACE/ADA: New Consensus Statement on Inpatient Glycemic Control  Target Ranges:  Prepandial:   less than 140 mg/dL      Peak postprandial:   less than 180 mg/dL (1-2 hours)      Critically ill patients:  140 - 180 mg/dL  Results for Eddie Mcdaniel, Eddie Mcdaniel" (MRN [id number]) as of 10/28/2018 14:11  Ref. Range 10/28/2018 03:10 10/28/2018 07:50 10/28/2018 08:24 10/28/2018 12:13  Glucose-Capillary Latest Ref Range: 70 - 99 mg/dL 127 (H) 65 (L) 148 (H) 238 (H)   Results for Eddie Mcdaniel, Eddie Mcdaniel" (MRN [id number]) as of 10/28/2018 14:11  Ref. Range 10/27/2018 08:44 10/27/2018 11:32 10/27/2018 16:20 10/27/2018 16:33 10/27/2018 17:33 10/27/2018 18:34 10/27/2018 19:26 10/27/2018 21:22  Glucose-Capillary Latest Ref Range: 70 - 99 mg/dL 101 (H) 320 (H)  Novolog 32 units 25 (LL) 226 (H) 94 179 (H) 211 (H) 158 (H)   Review of Glycemic Control  Diabetes history: DM2 Outpatient Diabetes medications: Metformin XR 750 mg BID Current orders for Inpatient glycemic control: CBG monitoring  Inpatient Diabetes Program Recommendations:   Insulin-Noted Lantus, Novolog meal coverage, Novolog correction scale, and steroids have been discontinued. CBG up to 238 mg/dl at 12:13 today. Recommend at least ordering Novolog 0-9 units Q4H to use for correction.  Thanks, Barnie Alderman, RN, MSN, CDE Diabetes Coordinator Inpatient Diabetes Program [phone] (Team Pager from 8am to 5pm)

## 2018-10-29 DIAGNOSIS — J069 Acute upper respiratory infection, unspecified: Secondary | ICD-10-CM

## 2018-10-29 LAB — GLUCOSE, CAPILLARY
Glucose-Capillary: 114 mg/dL — ABNORMAL HIGH (ref 70–99)
Glucose-Capillary: 288 mg/dL — ABNORMAL HIGH (ref 70–99)
Glucose-Capillary: 236 mg/dL — ABNORMAL HIGH (ref 70–99)
Glucose-Capillary: 321 mg/dL — ABNORMAL HIGH (ref 70–99)

## 2018-10-29 LAB — D-DIMER, QUANTITATIVE: D-Dimer, Quant: 1.97 ug/mL-FEU — ABNORMAL HIGH (ref 0.00–0.50)

## 2018-10-29 LAB — HEMOGLOBIN A1C
Hgb A1c MFr Bld: 7.4 % — ABNORMAL HIGH (ref 4.8–5.6)
Mean Plasma Glucose: 165.68 mg/dL

## 2018-10-29 LAB — C-REACTIVE PROTEIN: CRP: 9 mg/dL — ABNORMAL HIGH (ref <1.0)

## 2018-10-29 MED ORDER — INSULIN ASPART 100 UNIT/ML ~~LOC~~ SOLN
1.0000 [IU] | Freq: Three times a day (TID) | SUBCUTANEOUS | Status: DC
  Administered 2018-10-29: 2 [IU] via SUBCUTANEOUS
  Administered 2018-10-29 – 2018-10-30 (×2): 3 [IU] via SUBCUTANEOUS
  Administered 2018-10-30: 2 [IU] via SUBCUTANEOUS
  Administered 2018-10-31: 4 [IU] via SUBCUTANEOUS
  Administered 2018-10-31: 1 [IU] via SUBCUTANEOUS
  Administered 2018-10-31 – 2018-11-01 (×2): 3 [IU] via SUBCUTANEOUS
  Administered 2018-11-01: 5 [IU] via SUBCUTANEOUS
  Administered 2018-11-01: 2 [IU] via SUBCUTANEOUS
  Administered 2018-11-02: 5 [IU] via SUBCUTANEOUS
  Administered 2018-11-02: 2 [IU] via SUBCUTANEOUS
  Administered 2018-11-02: 3 [IU] via SUBCUTANEOUS
  Administered 2018-11-03: 5 [IU] via SUBCUTANEOUS
  Administered 2018-11-03: 2 [IU] via SUBCUTANEOUS

## 2018-10-29 MED ORDER — OXYCODONE HCL 5 MG PO TABS
5.0000 mg | ORAL_TABLET | Freq: Four times a day (QID) | ORAL | Status: DC | PRN
  Administered 2018-10-29 (×2): 5 mg via ORAL
  Filled 2018-10-29 (×2): qty 1

## 2018-10-29 MED ORDER — GUAIFENESIN-DM 100-10 MG/5ML PO SYRP
5.0000 mL | ORAL_SOLUTION | ORAL | Status: DC | PRN
  Administered 2018-10-29 (×2): 5 mL via ORAL
  Filled 2018-10-29 (×2): qty 10

## 2018-10-29 MED ORDER — ENSURE MAX PROTEIN PO LIQD
11.0000 [oz_av] | Freq: Two times a day (BID) | ORAL | Status: DC
  Administered 2018-10-30 (×2): 11 [oz_av] via ORAL
  Filled 2018-10-29 (×4): qty 330

## 2018-10-29 MED ORDER — FUROSEMIDE 10 MG/ML IJ SOLN
40.0000 mg | Freq: Once | INTRAMUSCULAR | Status: AC
  Administered 2018-10-29: 40 mg via INTRAVENOUS
  Filled 2018-10-29: qty 4

## 2018-10-29 NOTE — Progress Notes (Signed)
Nutrition Follow-up RD working remotely.  DOCUMENTATION CODES:   Not applicable  INTERVENTION:    Increase Ensure Max to BID, each supplement provides 150 kcal and 30 grams of protein.    Continue Hormel Shake daily with breakfast which provides 520 kcals and 22 g of protein and Magic cup BID with lunch and dinner, each supplement provides 290 kcal and 9 grams of protein, automatically on meal trays to optimize nutritional intake.   NUTRITION DIAGNOSIS:   Increased nutrient needs related to acute illness as evidenced by estimated needs.  Ongoing   GOAL:   Patient will meet greater than or equal to 90% of their needs  Progressing   MONITOR:   PO intake, Supplement acceptance  ASSESSMENT:   78 yo male admitted with coronavirus pneumonitis. PMH includes CAD, HTN, DM.  Patient remains lethargic. He is currently requiring 3 L nasal cannula. His PO intake has been very good (100% meal completion) up until yesterday when he ate 50% of all meals. He has been drinking Ensure Max once daily.   Labs reviewed. Sodium 129 (L) CBG's: [id number] Medications reviewed. CBG's up and down yesterday, insulin d/c'ed and insulin requirements being re-evaluated today.   No new weights available for review.   Diet Order:   Diet Order            Diet vegetarian Room service appropriate? Yes; Fluid consistency: Thin  Diet effective now              EDUCATION NEEDS:   No education needs have been identified at this time  Skin:  Skin Assessment: Reviewed RN Assessment  Last BM:  9/29  Height:   Ht Readings from Last 1 Encounters:  10/16/18 5\' 5"  (1.651 m)    Weight:   Wt Readings from Last 1 Encounters:  10/16/18 63.4 kg    Ideal Body Weight:  61.8 kg  BMI:  Body mass index is 23.26 kg/m.  Estimated Nutritional Needs:   Kcal:  1800-2100  Protein:  80-100 gm  Fluid:  1.8 L    Molli Barrows, RD, LDN, CNSC Pager [phone] After Hours Pager  [phone]

## 2018-10-29 NOTE — Progress Notes (Signed)
Pt A&Ox4, sitting up in chair, VSS, c/o persistant non-productive cough that causes chest soreness. Aminon page sent to Dr. Cruzita Lederer. Awaiting Md. Orders.

## 2018-10-29 NOTE — Progress Notes (Signed)
Physical Therapy Treatment Patient Details Name: Eddie Mcdaniel MRN: [id number] DOB: [birth year] Today's Date: 10/29/2018    History of Present Illness 78 y.o. male,  w hypertension, hyperlipidemia, Dm2, CAD, rt rotator cuff repair x2 apparently presents with c/o feeling sick, fever last week, and has had poor po intake and poor energy,  and odd bitter taste in his mouth. Pt denies any travel or covid exposure. Pt admitted 10/15/18 for acute respiratory failure with hypoxia secondary to Covid -19, CAP; +lt pleural effusion    PT Comments    Patient continues to improve. Able to walk 150 ft on 4L with sats at lowest 88%, but with standing rest break increased to 90% <1 minute. Generally 89-93% on 4L while walking. Introduced use of rollator as pt continues to be unsteady when walking so slowly to maintain slower RR and O2 sats. He is not sure he wants a rollator for discharge.     Follow Up Recommendations  Home health PT;Supervision/Assistance - 24 hour     Equipment Recommendations  Other (comment)(pt considering rollator (can benefit))    Recommendations for Other Services       Precautions / Restrictions Precautions Precautions: Fall;Other (comment) Precaution Comments: monitor O2 Restrictions Weight Bearing Restrictions: No    Mobility  Bed Mobility                  Transfers Overall transfer level: Needs assistance Equipment used: 4-wheeled walker Transfers: Sit to/from Stand Sit to Stand: Supervision         General transfer comment: good awareness of lines/monitors; vc for use of rollator including one seated rest on rollator while walking  Ambulation/Gait Ambulation/Gait assistance: Min guard Gait Distance (Feet): 150 Feet(seated rest due to dyspnea/cough; 50 ft to return to room) Assistive device: 4-wheeled walker Gait Pattern/deviations: Step-through pattern;Decreased stride length Gait velocity: vc for slower, deeper breaths with pursed lip  breathing   General Gait Details: on 3L at rest sats 97%; req;uired 4L to walk with sats 88-95%   Stairs             Wheelchair Mobility    Modified Rankin (Stroke Patients Only)       Balance Overall balance assessment: Needs assistance Sitting-balance support: Feet supported;No upper extremity supported Sitting balance-Leahy Scale: Good     Standing balance support: No upper extremity supported Standing balance-Leahy Scale: Good                              Cognition Arousal/Alertness: Awake/alert Behavior During Therapy: WFL for tasks assessed/performed Overall Cognitive Status: No family/caregiver present to determine baseline cognitive functioning                                 General Comments: ? HOH vs decr cognition      Exercises Other Exercises Other Exercises: pt reports he has already done his LE exercises today, including sit to stand x 6 reps (return demonstrated good technique)    General Comments        Pertinent Vitals/Pain      Home Living Family/patient expects to be discharged to:: Private residence Living Arrangements: Spouse/significant other Available Help at Discharge: Family;Available 24 hours/day Type of Home: House Home Access: Stairs to enter Entrance Stairs-Rails: None Home Layout: One level Home Equipment: None      Prior Function Level of Independence: Independent  Comments: exercising 7x/wk (walking); retired; drives   PT Goals (current goals can now be found in the care plan section) Acute Rehab PT Goals Patient Stated Goal: get better and return home Time For Goal Achievement: 10/31/18 Potential to Achieve Goals: Good Progress towards PT goals: Progressing toward goals    Frequency    Min 3X/week      PT Plan Current plan remains appropriate    Co-evaluation              AM-PAC PT "6 Clicks" Mobility   Outcome Measure  Help needed turning from your back to your  side while in a flat bed without using bedrails?: None Help needed moving from lying on your back to sitting on the side of a flat bed without using bedrails?: A Little Help needed moving to and from a bed to a chair (including a wheelchair)?: A Little Help needed standing up from a chair using your arms (e.g., wheelchair or bedside chair)?: A Little Help needed to walk in hospital room?: A Little Help needed climbing 3-5 steps with a railing? : A Lot 6 Click Score: 18    End of Session Equipment Utilized During Treatment: Oxygen Activity Tolerance: Patient limited by fatigue(desats) Patient left: with call bell/phone within reach;in chair Nurse Communication: Mobility status;Other (comment)(decreased sats; on 3L at end of session) PT Visit Diagnosis: Unsteadiness on feet (R26.81);Difficulty in walking, not elsewhere classified (R26.2)     Time: [id number] PT Time Calculation (min) (ACUTE ONLY): 32 min  Charges:  $Gait Training: 23-37 mins                       Eddie Mcdaniel, PT       Eddie Mcdaniel 10/29/2018, 12:22 PM

## 2018-10-29 NOTE — Progress Notes (Signed)
Late Entry for 10/29/18: Patient seen and assessed. Physical assessment completed via computerized charting via [place]. Pt found sitting in bedside chair. No acute distress noted. The writer of the notes informs patient of medication regimen this PM to include Lovenox subcutaneous. Pt makes request for pain pill and cough syrup with medication round. Will comply with request.

## 2018-10-29 NOTE — Progress Notes (Signed)
PROGRESS NOTE  Eddie Mcdaniel [id number] DOB: Dec 10, 1940 DOA: 10/15/2018 PCP: Ann Held, DO   LOS: 14 days   Brief Narrative / Interim history: 78 year old male with CAD status post PCI 2017, hypertension, diabetes mellitus who was admitted to the hospital on 10/15/2018 1:20 week of fever, fatigue, malaise, anosmia.  Chest x-ray on admission showed multifocal pneumonia, he was hypoxic and was admitted to the hospital.  He finished a course of Remdesivir, he received convalescent plasma on 9/19 and finished steroids as well.  Hospital course complicated by persistent hypoxia  Subjective / 24h Interval events: Feeling a little bit improved this morning, he denies any shortness of breath at rest but gets short of breath with minimal activity.  Assessment & Plan: Principal Problem:   COVID-19 virus infection Active Problems:   Uncontrolled type 2 diabetes mellitus without complication, without long-term current use of insulin (HCC)   Hyperlipidemia LDL goal <70   Essential hypertension   Coronary artery disease involving native coronary artery of native heart without angina pectoris   Acute respiratory failure with hypoxia (HCC)   Abnormal liver function   Hyponatremia   Principal Problem Acute Hypoxic Respiratory Failure due to Covid-19 Viral Illness -Completed treatment with Remdesivir, steroids, received convalescent plasma on 9/19 -Flutter valve, incentive spirometry -Remains hypoxic, took off oxygen while talking to me this morning and desatted to low 80s on room air at rest -Very mild lower extremity swelling, will give Lasix x1   COVID-19 Labs  Recent Labs    10/27/18 0315 10/28/18 1020 10/29/18 0520  DDIMER 3.86*  --  1.97*  CRP  --  7.9* 9.0*    Lab Results  Component Value Date   SARSCOV2NAA POSITIVE (A) 10/15/2018    Active Problems Hypertension / CAD / HLD -Continue Norvasc, Coreg, aspirin, statin  Type 2 diabetes, non-insulin-dependent,  very brittle with episodes of hyper and hypoglycemia -CBGs last night in the 300 range, will place in a custom very mild sliding scale to avoid further episodes of hypoglycemia  CBG (last 3)  Recent Labs    10/28/18 1619 10/28/18 2040 10/29/18 0807  GLUCAP 240* 317* 114*    Positive blood culture -Diphtheroids in 1/4, likely contaminant.  Stable, afebrile, continue to monitor off antibiotics  Disposition: -Remains quite hypoxic, worse with exertion but gradually improving.  PT/OT recommended home health   Scheduled Meds: . amLODipine  5 mg Oral Daily  . aspirin EC  81 mg Oral Daily  . atorvastatin  20 mg Oral QPM  . carvedilol  12.5 mg Oral BID WC  . enoxaparin (LOVENOX) injection  40 mg Subcutaneous Q24H  . influenza vaccine adjuvanted  0.5 mL Intramuscular Tomorrow-1000  . mouth rinse  15 mL Mouth Rinse BID  . oxymetazoline  1 spray Each Nare BID  . Ensure Max Protein  11 oz Oral BID  . sodium chloride flush  3 mL Intravenous Once   Continuous Infusions: PRN Meds:.acetaminophen, dextrose, guaiFENesin-dextromethorphan, lip balm, oxyCODONE, sodium chloride  DVT prophylaxis: Lovenox Code Status: Full code Family Communication: d/w son Konrad Dolores [phone] Disposition Plan: home when ready   Consultants:  None   Procedures:  None   Microbiology: 10/15/2018 blood cultures 1 out of 4 bottles with diphtheroids  Antimicrobials: None    Objective: Vitals:   10/29/18 0407 10/29/18 0810 10/29/18 0835 10/29/18 0928  BP: 118/67 107/70  107/70  Pulse: 61 66  69  Resp: 19 17    Temp: 97.8 F (36.6 C) 98.2 F (  36.8 C)    TempSrc: Oral Oral    SpO2: 96% 91% 90%   Weight:      Height:        Intake/Output Summary (Last 24 hours) at 10/29/2018 1016 Last data filed at 10/29/2018 0500 Gross per 24 hour  Intake 600 ml  Output 1700 ml  Net -1100 ml   Filed Weights   10/16/18 0100 10/16/18 0309  Weight: 66.2 kg 63.4 kg    Examination:  Constitutional: NAD Eyes:  PERRL, lids and conjunctivae normal ENMT: Mucous membranes are moist.  Respiratory: Rhonchi at the bases, no wheezing, faint crackles at the bases.  Moves air well. Cardiovascular: Regular rate and rhythm, no murmurs / rubs / gallops.  Trace LE edema. 2+ pedal pulses. No carotid bruits.  Abdomen: no tenderness. Bowel sounds positive.  Musculoskeletal: no clubbing / cyanosis. Skin: no rashes, lesions, ulcers. No induration Neurologic: CN 2-12 grossly intact. Strength 5/5 in all 4.  Psychiatric: Normal judgment and insight. Alert and oriented x 3. Normal mood.    Data Reviewed: I have independently reviewed following labs and imaging studies   CBC: Recent Labs  Lab 10/23/18 0325 10/25/18 0522 10/27/18 0315  WBC 14.3* 18.7* 16.5*  HGB 11.4* 12.9* 11.4*  HCT 33.6* 38.2* 33.5*  MCV 87.5 88.0 88.6  PLT 277 295 [id number]   Basic Metabolic Panel: Recent Labs  Lab 10/23/18 0325 10/25/18 0522 10/27/18 0315  NA 134* 135 129*  K 4.4 4.9 4.7  CL 101 101 95*  CO2 26 25 20*  GLUCOSE 175* 127* 113*  BUN 28* 24* 27*  CREATININE 0.59* 0.73 0.60*  CALCIUM 8.3* 8.8* 8.2*   GFR: Estimated Creatinine Clearance: 68.3 mL/min (A) (by C-G formula based on SCr of 0.6 mg/dL (L)). Liver Function Tests: Recent Labs  Lab 10/23/18 0325 10/25/18 0522 10/27/18 0315  AST 22 38 34  ALT 26 62* 70*  ALKPHOS 51 54 49  BILITOT 0.7 0.8 0.7  PROT 5.3* 5.9* 5.0*  ALBUMIN 2.5* 2.7* 2.3*   No results for input(s): LIPASE, AMYLASE in the last 168 hours. No results for input(s): AMMONIA in the last 168 hours. Coagulation Profile: No results for input(s): INR, PROTIME in the last 168 hours. Cardiac Enzymes: No results for input(s): CKTOTAL, CKMB, CKMBINDEX, TROPONINI in the last 168 hours. BNP (last 3 results) No results for input(s): PROBNP in the last 8760 hours. HbA1C: No results for input(s): HGBA1C in the last 72 hours. CBG: Recent Labs  Lab 10/28/18 0824 10/28/18 1213 10/28/18 1619 10/28/18  2040 10/29/18 0807  GLUCAP 148* 238* 240* 317* 114*   Lipid Profile: No results for input(s): CHOL, HDL, LDLCALC, TRIG, CHOLHDL, LDLDIRECT in the last 72 hours. Thyroid Function Tests: No results for input(s): TSH, T4TOTAL, FREET4, T3FREE, THYROIDAB in the last 72 hours. Anemia Panel: No results for input(s): VITAMINB12, FOLATE, FERRITIN, TIBC, IRON, RETICCTPCT in the last 72 hours. Urine analysis:    Component Value Date/Time   COLORURINE AMBER (A) 10/15/2018 1730   APPEARANCEUR HAZY (A) 10/15/2018 1730   LABSPEC 1.019 10/15/2018 1730   PHURINE 5.0 10/15/2018 1730   GLUCOSEU NEGATIVE 10/15/2018 1730   HGBUR NEGATIVE 10/15/2018 1730   HGBUR small 11/18/2009 0910   BILIRUBINUR NEGATIVE 10/15/2018 1730   BILIRUBINUR neg 10/25/2015 1141   KETONESUR NEGATIVE 10/15/2018 1730   PROTEINUR 30 (A) 10/15/2018 1730   UROBILINOGEN 0.2 10/25/2015 1141   UROBILINOGEN negative 11/18/2009 0910   NITRITE NEGATIVE 10/15/2018 1730   LEUKOCYTESUR NEGATIVE 10/15/2018 1730  Sepsis Labs: Invalid input(s): PROCALCITONIN, LACTICIDVEN  No results found for this or any previous visit (from the past 240 hour(s)).    Radiology Studies: No results found.   Marzetta Board, MD, PhD [facility]  Contact via  [email or web address]  [place] P: [phone] F: [phone]

## 2018-10-30 DIAGNOSIS — J189 Pneumonia, unspecified organism: Secondary | ICD-10-CM

## 2018-10-30 LAB — CBC
HCT: 31.7 % — ABNORMAL LOW (ref 39.0–52.0)
Hemoglobin: 10.6 g/dL — ABNORMAL LOW (ref 13.0–17.0)
MCH: 29.9 pg (ref 26.0–34.0)
MCHC: 33.4 g/dL (ref 30.0–36.0)
MCV: 89.5 fL (ref 80.0–100.0)
Platelets: 200 10*3/uL (ref 150–400)
RBC: 3.54 MIL/uL — ABNORMAL LOW (ref 4.22–5.81)
RDW: 13.5 % (ref 11.5–15.5)
WBC: 12.7 10*3/uL — ABNORMAL HIGH (ref 4.0–10.5)
nRBC: 0 % (ref 0.0–0.2)

## 2018-10-30 LAB — GLUCOSE, CAPILLARY
Glucose-Capillary: 144 mg/dL — ABNORMAL HIGH (ref 70–99)
Glucose-Capillary: 259 mg/dL — ABNORMAL HIGH (ref 70–99)
Glucose-Capillary: 208 mg/dL — ABNORMAL HIGH (ref 70–99)
Glucose-Capillary: 420 mg/dL — ABNORMAL HIGH (ref 70–99)

## 2018-10-30 LAB — COMPREHENSIVE METABOLIC PANEL
ALT: 65 U/L — ABNORMAL HIGH (ref 0–44)
AST: 33 U/L (ref 15–41)
Albumin: 2.3 g/dL — ABNORMAL LOW (ref 3.5–5.0)
Alkaline Phosphatase: 59 U/L (ref 38–126)
Anion gap: 6 (ref 5–15)
BUN: 19 mg/dL (ref 8–23)
CO2: 27 mmol/L (ref 22–32)
Calcium: 8.2 mg/dL — ABNORMAL LOW (ref 8.9–10.3)
Chloride: 91 mmol/L — ABNORMAL LOW (ref 98–111)
Creatinine, Ser: 0.7 mg/dL (ref 0.61–1.24)
GFR calc Af Amer: >60 mL/min (ref >60)
GFR calc non Af Amer: >60 mL/min (ref >60)
Glucose, Bld: 145 mg/dL — ABNORMAL HIGH (ref 70–99)
Potassium: 4.9 mmol/L (ref 3.5–5.1)
Sodium: 124 mmol/L — ABNORMAL LOW (ref 135–145)
Total Bilirubin: 0.8 mg/dL (ref 0.3–1.2)
Total Protein: 5.7 g/dL — ABNORMAL LOW (ref 6.5–8.1)

## 2018-10-30 LAB — C-REACTIVE PROTEIN: CRP: 8.9 mg/dL — ABNORMAL HIGH (ref <1.0)

## 2018-10-30 LAB — MAGNESIUM: Magnesium: 1.9 mg/dL (ref 1.7–2.4)

## 2018-10-30 LAB — D-DIMER, QUANTITATIVE: D-Dimer, Quant: 1.67 ug/mL-FEU — ABNORMAL HIGH (ref 0.00–0.50)

## 2018-10-30 MED ORDER — ALBUTEROL SULFATE HFA 108 (90 BASE) MCG/ACT IN AERS
2.0000 | INHALATION_SPRAY | Freq: Four times a day (QID) | RESPIRATORY_TRACT | Status: DC
  Administered 2018-10-30 – 2018-11-04 (×20): 2 via RESPIRATORY_TRACT
  Filled 2018-10-30: qty 6.7

## 2018-10-30 MED ORDER — INSULIN ASPART 100 UNIT/ML ~~LOC~~ SOLN
2.0000 [IU] | Freq: Three times a day (TID) | SUBCUTANEOUS | Status: DC
  Administered 2018-10-30 – 2018-10-31 (×3): 2 [IU] via SUBCUTANEOUS

## 2018-10-30 NOTE — Plan of Care (Signed)
  Problem: Education: Goal: Knowledge of risk factors and measures for prevention of condition will improve Outcome: Progressing   Problem: Respiratory: Goal: Will maintain a patent airway Outcome: Progressing Goal: Complications related to the disease process, condition or treatment will be avoided or minimized Outcome: Progressing   Problem: Education: Goal: Knowledge of General Education information will improve Description: Including pain rating scale, medication(s)/side effects and non-pharmacologic comfort measures Outcome: Progressing   Problem: Health Behavior/Discharge Planning: Goal: Ability to manage health-related needs will improve Outcome: Progressing   Problem: Clinical Measurements: Goal: Ability to maintain clinical measurements within normal limits will improve Outcome: Progressing Goal: Will remain free from infection Outcome: Progressing Goal: Diagnostic test results will improve Outcome: Progressing Goal: Respiratory complications will improve Outcome: Progressing Goal: Cardiovascular complication will be avoided Outcome: Progressing   Problem: Activity: Goal: Risk for activity intolerance will decrease Outcome: Progressing   Problem: Nutrition: Goal: Adequate nutrition will be maintained Outcome: Progressing   Problem: Coping: Goal: Level of anxiety will decrease Outcome: Progressing   Problem: Elimination: Goal: Will not experience complications related to bowel motility Outcome: Progressing Goal: Will not experience complications related to urinary retention Outcome: Progressing   Problem: Pain Managment: Goal: General experience of comfort will improve Outcome: Progressing   Problem: Safety: Goal: Ability to remain free from injury will improve Outcome: Progressing   Problem: Skin Integrity: Goal: Risk for impaired skin integrity will decrease Outcome: Progressing   

## 2018-10-30 NOTE — Progress Notes (Signed)
PT Cancellation Note  Patient Details Name: Eddie Mcdaniel MRN: [id number] DOB: 1940/06/11   Cancelled Treatment:    Reason Eval/Treat Not Completed: Patient declined, reports that he was up in recliner and just got back to bed. States in AM he will be ready.  [name] 10/30/2018, 4:23 PM

## 2018-10-30 NOTE — Progress Notes (Signed)
PROGRESS NOTE  Eddie Mcdaniel [name] DOB: Feb 05, 1940 DOA: 10/15/2018 PCP: Ann Held, DO   LOS: 15 days   Brief Narrative / Interim history: 78 year old male with CAD status post PCI 2017, hypertension, diabetes mellitus who was admitted to the hospital on 10/15/2018 1:20 week of fever, fatigue, malaise, anosmia.  Chest x-ray on admission showed multifocal pneumonia, he was hypoxic and was admitted to the hospital.  He finished a course of Remdesivir, he received convalescent plasma on 9/19 and finished steroids as well.  Hospital course complicated by persistent hypoxia  Subjective / 24h Interval events: Denies any shortness of breath at rest but gets pretty dyspneic with minimal activity  Assessment & Plan: Principal Problem:   COVID-19 virus infection Active Problems:   Uncontrolled type 2 diabetes mellitus without complication, without long-term current use of insulin   Hyperlipidemia LDL goal <70   Essential hypertension   Coronary artery disease involving native coronary artery of native heart without angina pectoris   Acute respiratory failure with hypoxia (HCC)   Abnormal liver function   Hyponatremia   Principal Problem Acute Hypoxic Respiratory Failure due to Covid-19 Viral Illness -Completed treatment with Remdesivir, steroids, received convalescent plasma on 9/19 -Flutter valve, incentive spirometry -Remains hypoxic, I turned the oxygen down when I was down to the patient this morning and dipped into the low 80s put him back on 2 L -Lasix x1 on 10/30, will not repeat given hyponatremia   COVID-19 Labs  Recent Labs    10/28/18 1020 10/29/18 0520 10/30/18 0352  DDIMER  --  1.97* 1.67*  CRP 7.9* 9.0* 8.9*    Lab Results  Component Value Date   SARSCOV2NAA POSITIVE (A) 10/15/2018    Active Problems Hypertension / CAD / HLD -Continue Norvasc, Coreg, aspirin, statin   Type 2 diabetes, non-insulin-dependent, very brittle with episodes of  hyper and hypoglycemia -CBGs last night in the 300 range, will place in a custom very mild sliding scale to avoid further episodes of hypoglycemia -Added on 2 units scheduled in addition to his sliding scale for lunch and evening hyperglycemia  CBG (last 3)  Recent Labs    10/29/18 1642 10/29/18 2117 10/30/18 0749  GLUCAP 236* 321* 144*   Hyponatremia  -likely hypovolemic, hold further doses of Lasix and allow p.o. intake  Positive blood culture -Diphtheroids in 1/4, likely contaminant.  Stable, afebrile, continue to monitor off antibiotics  Disposition: -Remains quite hypoxic, worse with exertion but gradually improving.  PT/OT recommended home health.  Anticipate discharge in 2 to 3 days   Scheduled Meds: . albuterol  2 puff Inhalation Q6H  . amLODipine  5 mg Oral Daily  . aspirin EC  81 mg Oral Daily  . atorvastatin  20 mg Oral QPM  . carvedilol  12.5 mg Oral BID WC  . enoxaparin (LOVENOX) injection  40 mg Subcutaneous Q24H  . influenza vaccine adjuvanted  0.5 mL Intramuscular Tomorrow-1000  . insulin aspart  1-5 Units Subcutaneous TID WC  . insulin aspart  2 Units Subcutaneous TID WC  . mouth rinse  15 mL Mouth Rinse BID  . oxymetazoline  1 spray Each Nare BID  . Ensure Max Protein  11 oz Oral BID  . sodium chloride flush  3 mL Intravenous Once   Continuous Infusions: PRN Meds:.acetaminophen, dextrose, guaiFENesin-dextromethorphan, lip balm, oxyCODONE, sodium chloride  DVT prophylaxis: Lovenox Code Status: Full code Family Communication: d/w son Konrad Dolores [phone] Disposition Plan: home when ready   Consultants:  None  Procedures:  None   Microbiology: 10/15/2018 blood cultures 1 out of 4 bottles with diphtheroids  Antimicrobials: None    Objective: Vitals:   10/29/18 1652 10/29/18 1930 10/30/18 0416 10/30/18 0748  BP: 102/61 (!) 92/51 (!) 104/57 102/60  Pulse: 62   64  Resp:  18 16 18   Temp:  98 F (36.7 C) 98.7 F (37.1 C) 98.7 F (37.1 C)   TempSrc:  Oral Oral Oral  SpO2:  91%  97%  Weight:      Height:        Intake/Output Summary (Last 24 hours) at 10/30/2018 1118 Last data filed at 10/30/2018 0725 Gross per 24 hour  Intake 360 ml  Output 1240 ml  Net -880 ml   Filed Weights   10/16/18 0100 10/16/18 0309  Weight: 66.2 kg 63.4 kg    Examination:  Constitutional: no distress, sitting in chair and eating breakfast  Eyes: no scleral icterus  ENMT: mmm Respiratory: rhonchi at the bases, no wheezing Cardiovascular: RRR, no murmurs appreciated Abdomen: Soft, nontender, nondistended, positive bowel sounds Musculoskeletal: no clubbing / cyanosis. Skin: No rashes seen Neurologic: Nonfocal, equal strength Psychiatric: Normal judgment and insight. Alert and oriented x 3. Normal mood.    Data Reviewed: I have independently reviewed following labs and imaging studies   CBC: Recent Labs  Lab 10/25/18 0522 10/27/18 0315 10/30/18 0352  WBC 18.7* 16.5* 12.7*  HGB 12.9* 11.4* 10.6*  HCT 38.2* 33.5* 31.7*  MCV 88.0 88.6 89.5  PLT 295 219 [id number]   Basic Metabolic Panel: Recent Labs  Lab 10/25/18 0522 10/27/18 0315 10/30/18 0352  NA 135 129* 124*  K 4.9 4.7 4.9  CL 101 95* 91*  CO2 25 20* 27  GLUCOSE 127* 113* 145*  BUN 24* 27* 19  CREATININE 0.73 0.60* 0.70  CALCIUM 8.8* 8.2* 8.2*  MG  --   --  1.9   GFR: Estimated Creatinine Clearance: 68.3 mL/min (by C-G formula based on SCr of 0.7 mg/dL). Liver Function Tests: Recent Labs  Lab 10/25/18 0522 10/27/18 0315 10/30/18 0352  AST 38 34 33  ALT 62* 70* 65*  ALKPHOS 54 49 59  BILITOT 0.8 0.7 0.8  PROT 5.9* 5.0* 5.7*  ALBUMIN 2.7* 2.3* 2.3*   No results for input(s): LIPASE, AMYLASE in the last 168 hours. No results for input(s): AMMONIA in the last 168 hours. Coagulation Profile: No results for input(s): INR, PROTIME in the last 168 hours. Cardiac Enzymes: No results for input(s): CKTOTAL, CKMB, CKMBINDEX, TROPONINI in the last 168 hours. BNP (last  3 results) No results for input(s): PROBNP in the last 8760 hours. HbA1C: Recent Labs    10/29/18 0520  HGBA1C 7.4*   CBG: Recent Labs  Lab 10/29/18 0807 10/29/18 1150 10/29/18 1642 10/29/18 2117 10/30/18 0749  GLUCAP 114* 288* 236* 321* 144*   Lipid Profile: No results for input(s): CHOL, HDL, LDLCALC, TRIG, CHOLHDL, LDLDIRECT in the last 72 hours. Thyroid Function Tests: No results for input(s): TSH, T4TOTAL, FREET4, T3FREE, THYROIDAB in the last 72 hours. Anemia Panel: No results for input(s): VITAMINB12, FOLATE, FERRITIN, TIBC, IRON, RETICCTPCT in the last 72 hours. Urine analysis:    Component Value Date/Time   COLORURINE AMBER (A) 10/15/2018 1730   APPEARANCEUR HAZY (A) 10/15/2018 1730   LABSPEC 1.019 10/15/2018 1730   PHURINE 5.0 10/15/2018 1730   GLUCOSEU NEGATIVE 10/15/2018 1730   HGBUR NEGATIVE 10/15/2018 1730   HGBUR small 11/18/2009 0910   BILIRUBINUR NEGATIVE 10/15/2018 1730   BILIRUBINUR  neg 10/25/2015 1141   KETONESUR NEGATIVE 10/15/2018 1730   PROTEINUR 30 (A) 10/15/2018 1730   UROBILINOGEN 0.2 10/25/2015 1141   UROBILINOGEN negative 11/18/2009 0910   NITRITE NEGATIVE 10/15/2018 1730   LEUKOCYTESUR NEGATIVE 10/15/2018 1730   Sepsis Labs: Invalid input(s): PROCALCITONIN, LACTICIDVEN  No results found for this or any previous visit (from the past 240 hour(s)).    Radiology Studies: No results found.   Marzetta Board, MD, PhD [facility]  Contact via  [email or web address]  [place] P: [phone] F: [phone]

## 2018-10-30 NOTE — Progress Notes (Signed)
Inpatient Diabetes Program Recommendations  AACE/ADA: New Consensus Statement on Inpatient Glycemic Control   Target Ranges:  Prepandial:   less than 140 mg/dL      Peak postprandial:   less than 180 mg/dL (1-2 hours)      Critically ill patients:  140 - 180 mg/dL   Results for Eddie Mcdaniel, Eddie Mcdaniel (MRN [id number]) as of 10/30/2018 09:53  Ref. Range 10/29/2018 08:07 10/29/2018 11:50 10/29/2018 16:42 10/29/2018 21:17 10/30/2018 07:49  Glucose-Capillary Latest Ref Range: 70 - 99 mg/dL 114 (H) 288 (H) 236 (H) 321 (H) 144 (H)   Review of Glycemic Control  Diabetes history: DM2 Outpatient Diabetes medications: Metformin XR 750 mg BID Current orders for Inpatient glycemic control: Novolog 1-5 units TID with meals  Inpatient Diabetes Program Recommendations:    Insulin-Meal Coverage: Please consider ordering Novolog 3 units TID with meals for meal coverage if patient eats at least 50% of meals.  Insulin-Correction: Please add bedtime correction to Novolog 1-5 units frequency.  Thanks, Barnie Alderman, RN, MSN, CDE Diabetes Coordinator Inpatient Diabetes Program [phone] (Team Pager from 8am to 5pm)

## 2018-10-31 ENCOUNTER — Inpatient Hospital Stay (HOSPITAL_COMMUNITY): Payer: Medicare HMO

## 2018-10-31 LAB — BASIC METABOLIC PANEL
Anion gap: 8 (ref 5–15)
BUN: 22 mg/dL (ref 8–23)
CO2: 23 mmol/L (ref 22–32)
Calcium: 8.2 mg/dL — ABNORMAL LOW (ref 8.9–10.3)
Chloride: 96 mmol/L — ABNORMAL LOW (ref 98–111)
Creatinine, Ser: 0.65 mg/dL (ref 0.61–1.24)
GFR calc Af Amer: >60 mL/min (ref >60)
GFR calc non Af Amer: >60 mL/min (ref >60)
Glucose, Bld: 321 mg/dL — ABNORMAL HIGH (ref 70–99)
Potassium: 4.6 mmol/L (ref 3.5–5.1)
Sodium: 127 mmol/L — ABNORMAL LOW (ref 135–145)

## 2018-10-31 LAB — GLUCOSE, CAPILLARY
Glucose-Capillary: 171 mg/dL — ABNORMAL HIGH (ref 70–99)
Glucose-Capillary: 282 mg/dL — ABNORMAL HIGH (ref 70–99)
Glucose-Capillary: 346 mg/dL — ABNORMAL HIGH (ref 70–99)
Glucose-Capillary: 374 mg/dL — ABNORMAL HIGH (ref 70–99)

## 2018-10-31 LAB — D-DIMER, QUANTITATIVE: D-Dimer, Quant: 1.44 ug/mL-FEU — ABNORMAL HIGH (ref 0.00–0.50)

## 2018-10-31 LAB — C-REACTIVE PROTEIN: CRP: 9.9 mg/dL — ABNORMAL HIGH (ref <1.0)

## 2018-10-31 MED ORDER — GLUCERNA SHAKE PO LIQD
237.0000 mL | Freq: Three times a day (TID) | ORAL | Status: DC
  Administered 2018-10-31 – 2018-11-04 (×12): 237 mL via ORAL
  Filled 2018-10-31 (×17): qty 237

## 2018-10-31 MED ORDER — PREDNISONE 20 MG PO TABS
40.0000 mg | ORAL_TABLET | Freq: Every day | ORAL | Status: DC
  Administered 2018-10-31 – 2018-11-02 (×3): 40 mg via ORAL
  Filled 2018-10-31 (×3): qty 2

## 2018-10-31 MED ORDER — INSULIN ASPART 100 UNIT/ML ~~LOC~~ SOLN
4.0000 [IU] | Freq: Three times a day (TID) | SUBCUTANEOUS | Status: DC
  Administered 2018-10-31 – 2018-11-01 (×3): 4 [IU] via SUBCUTANEOUS

## 2018-10-31 NOTE — Progress Notes (Signed)
Physical Therapy Treatment Patient Details Name: Eddie Mcdaniel MRN: [id number] DOB: 1940-03-14 Today's Date: 10/31/2018    History of Present Illness 78 y.o. male,  w hypertension, hyperlipidemia, Dm2, CAD, rt rotator cuff repair x2 apparently presents with c/o feeling sick, fever last week, and has had poor po intake and poor energy,  and odd bitter taste in his mouth. Pt denies any travel or covid exposure. Pt admitted 10/15/18 for acute respiratory failure with hypoxia secondary to Covid -19, CAP; +lt pleural effusion    PT Comments    Pt did well with tx and was able to complete most tasks with mod I- SBA. Ambulated 136ft with 4WW on 3L/min via HFNC and was able to maintain 02 sats >90, he desat to 84% aftre but was able to recover with pursed lip breathing.   Follow Up Recommendations  Home health PT;Supervision/Assistance - 24 hour     Equipment Recommendations  Other (comment)(4WW)    Recommendations for Other Services       Precautions / Restrictions Precautions Precautions: Fall;Other (comment)(monitor 02)    Mobility  Bed Mobility               General bed mobility comments: pt received in chair  Transfers Overall transfer level: Needs assistance Equipment used: 4-wheeled walker Transfers: Sit to/from Stand Sit to Stand: Supervision Stand pivot transfers: Supervision       General transfer comment: cues for walker safety needed  Ambulation/Gait Ambulation/Gait assistance: Supervision Gait Distance (Feet): 150 Feet Assistive device: 4-wheeled walker Gait Pattern/deviations: Step-through pattern;Decreased stride length     General Gait Details: on 3L HFNC sats remained in low 90s with gait desat to 84% after but able to recover quickly   Stairs             Wheelchair Mobility    Modified Rankin (Stroke Patients Only)       Balance Overall balance assessment: Needs assistance Sitting-balance support: Feet supported Sitting  balance-Leahy Scale: Good     Standing balance support: No upper extremity supported Standing balance-Leahy Scale: Good                              Cognition Arousal/Alertness: Awake/alert Behavior During Therapy: WFL for tasks assessed/performed Overall Cognitive Status: No family/caregiver present to determine baseline cognitive functioning                                 General Comments: therapist asked pt is he had pain several times and each time he thought she was talking about "c-o-u-r" or "c-o-u-p" when therapist clearly stated pain "p-a-i-n"      Exercises      General Comments        Pertinent Vitals/Pain Pain Assessment: 0-10 Pain Score: 8  Pain Location: headache Pain Descriptors / Indicators: Throbbing Pain Intervention(s): Limited activity within patient's tolerance;Monitored during session    Home Living                      Prior Function            PT Goals (current goals can now be found in the care plan section) Acute Rehab PT Goals Patient Stated Goal: get better and return home Time For Goal Achievement: 10/31/18 Potential to Achieve Goals: Good Progress towards PT goals: Progressing toward goals    Frequency  Min 3X/week      PT Plan Current plan remains appropriate    Co-evaluation              AM-PAC PT "6 Clicks" Mobility   Outcome Measure  Help needed turning from your back to your side while in a flat bed without using bedrails?: None Help needed moving from lying on your back to sitting on the side of a flat bed without using bedrails?: A Little Help needed moving to and from a bed to a chair (including a wheelchair)?: A Little Help needed standing up from a chair using your arms (e.g., wheelchair or bedside chair)?: None Help needed to walk in hospital room?: A Little Help needed climbing 3-5 steps with a railing? : A Lot 6 Click Score: 19    End of Session Equipment Utilized  During Treatment: Oxygen Activity Tolerance: Treatment limited secondary to medical complications (Comment);Patient limited by fatigue Patient left: in chair;with call bell/phone within reach   PT Visit Diagnosis: Unsteadiness on feet (R26.81);Difficulty in walking, not elsewhere classified (R26.2)     Time: [id number] PT Time Calculation (min) (ACUTE ONLY): 24 min  Charges:  $Gait Training: 23-37 mins                     Horald Chestnut, PT    Delford Field 10/31/2018, 12:49 PM

## 2018-10-31 NOTE — Plan of Care (Signed)
  Problem: Education: Goal: Knowledge of risk factors and measures for prevention of condition will improve Outcome: Progressing   Problem: Respiratory: Goal: Will maintain a patent airway Outcome: Progressing Goal: Complications related to the disease process, condition or treatment will be avoided or minimized Outcome: Progressing   Problem: Education: Goal: Knowledge of General Education information will improve Description: Including pain rating scale, medication(s)/side effects and non-pharmacologic comfort measures Outcome: Progressing   Problem: Health Behavior/Discharge Planning: Goal: Ability to manage health-related needs will improve Outcome: Progressing   Problem: Clinical Measurements: Goal: Ability to maintain clinical measurements within normal limits will improve Outcome: Progressing Goal: Will remain free from infection Outcome: Progressing Goal: Diagnostic test results will improve Outcome: Progressing Goal: Respiratory complications will improve Outcome: Progressing Goal: Cardiovascular complication will be avoided Outcome: Progressing   Problem: Activity: Goal: Risk for activity intolerance will decrease Outcome: Progressing   Problem: Nutrition: Goal: Adequate nutrition will be maintained Outcome: Progressing   Problem: Coping: Goal: Level of anxiety will decrease Outcome: Progressing   Problem: Elimination: Goal: Will not experience complications related to bowel motility Outcome: Progressing Goal: Will not experience complications related to urinary retention Outcome: Progressing   Problem: Pain Managment: Goal: General experience of comfort will improve Outcome: Progressing   Problem: Safety: Goal: Ability to remain free from injury will improve Outcome: Progressing   Problem: Skin Integrity: Goal: Risk for impaired skin integrity will decrease Outcome: Progressing   

## 2018-10-31 NOTE — Progress Notes (Signed)
PROGRESS NOTE  Eddie Mcdaniel [id number] DOB: [birth year] DOA: 10/15/2018 PCP: Ann Held, DO   LOS: 16 days   Brief Narrative / Interim history: 78 year old male with CAD status post PCI 2017, hypertension, diabetes mellitus who was admitted to the hospital on 10/15/2018 1:20 week of fever, fatigue, malaise, anosmia.  Chest x-ray on admission showed multifocal pneumonia, he was hypoxic and was admitted to the hospital.  He finished a course of Remdesivir, he received convalescent plasma on 9/19 and finished steroids as well.  Hospital course complicated by persistent hypoxia  Subjective / 24h Interval events: He is feeling about the same, he was too tired last night work with physical therapy.  On 3 to 5 L nasal cannula depending on activity  Assessment & Plan: Principal Problem:   COVID-19 virus infection Active Problems:   Uncontrolled type 2 diabetes mellitus without complication, without long-term current use of insulin   Hyperlipidemia LDL goal <70   Essential hypertension   Coronary artery disease involving native coronary artery of native heart without angina pectoris   Acute respiratory failure with hypoxia (HCC)   Abnormal liver function   Hyponatremia   Principal Problem Acute Hypoxic Respiratory Failure due to Covid-19 Viral Illness -Completed treatment with Remdesivir, steroids, received convalescent plasma on 9/19 -Flutter valve, incentive spirometry -Lasix x1 on 10/30, will not repeat given hyponatremia -Remains hypoxic, he is about to work with PT today to determine how much oxygen he will need with ambulation, he is okay with 2-3 L at rest  [place]    10/29/18 0520 10/30/18 0352 10/31/18 0252  DDIMER 1.97* 1.67* 1.44*  CRP 9.0* 8.9* 9.9*    Lab Results  Component Value Date   SARSCOV2NAA POSITIVE (A) 10/15/2018    Active Problems Hypertension / CAD / HLD -Continue Norvasc, Coreg, aspirin, statin.  Blood pressure  controlled   Type 2 diabetes, non-insulin-dependent, very brittle with episodes of hyper and hypoglycemia -CBGs last night in the 300 range, will place in a custom very mild sliding scale to avoid further episodes of hypoglycemia -Increase scheduled mealtime today for progressively elevated CBGs later in the day  CBG (last 3)  Recent Labs    10/30/18 1732 10/30/18 2139 10/31/18 0730  GLUCAP 208* 420* 171*   Hyponatremia  -Likely hypovolemic, worsened 224 after Lasix, now Lasix is on hold and slowly improving 127 this morning  Positive blood culture -Diphtheroids in 1/4, likely contaminant.  Stable, afebrile, continue to monitor off antibiotics  Disposition: -Remains quite hypoxic, worse with exertion but gradually improving.  PT/OT recommended home health.  They are to work with him again today   Scheduled Meds: . albuterol  2 puff Inhalation Q6H  . amLODipine  5 mg Oral Daily  . aspirin EC  81 mg Oral Daily  . atorvastatin  20 mg Oral QPM  . carvedilol  12.5 mg Oral BID WC  . enoxaparin (LOVENOX) injection  40 mg Subcutaneous Q24H  . feeding supplement (GLUCERNA SHAKE)  237 mL Oral TID BM  . influenza vaccine adjuvanted  0.5 mL Intramuscular Tomorrow-1000  . insulin aspart  1-5 Units Subcutaneous TID WC  . insulin aspart  4 Units Subcutaneous TID WC  . mouth rinse  15 mL Mouth Rinse BID  . oxymetazoline  1 spray Each Nare BID  . sodium chloride flush  3 mL Intravenous Once   Continuous Infusions: PRN Meds:.acetaminophen, dextrose, guaiFENesin-dextromethorphan, lip balm, oxyCODONE, sodium chloride  DVT prophylaxis: Lovenox Code Status:  Full code Family Communication: d/w son Konrad Dolores [phone] Disposition Plan: home when ready   Consultants:  None   Procedures:  None   Microbiology: 10/15/2018 blood cultures 1 out of 4 bottles with diphtheroids  Antimicrobials: None    Objective: Vitals:   10/30/18 1951 10/31/18 0408 10/31/18 0530 10/31/18 0939  BP: (!)  109/57 (!) 106/58  (!) 112/46  Pulse: 81 77 74 71  Resp: 18 19 20 20   Temp: 98.2 F (36.8 C) 98 F (36.7 C)  98.2 F (36.8 C)  TempSrc: Oral Oral  Oral  SpO2: 94% 93% 95% 97%  Weight:      Height:        Intake/Output Summary (Last 24 hours) at 10/31/2018 1027 Last data filed at 10/31/2018 0900 Gross per 24 hour  Intake 240 ml  Output 500 ml  Net -260 ml   Filed Weights   10/16/18 0100 10/16/18 0309  Weight: 66.2 kg 63.4 kg    Examination:  Constitutional: No apparent distress, sitting in the chair, eating breakfast Eyes: No scleral icterus seen ENMT: Moist mucous membranes Respiratory: Diminished at the bases, no wheezing Cardiovascular: Regular rate and rhythm, no murmurs appreciated Abdomen: Soft, nontender, nondistended, positive bowel sounds Musculoskeletal: no clubbing / cyanosis. Skin: No rashes seen Neurologic: No focal deficits   Data Reviewed: I have independently reviewed following labs and imaging studies   CBC: Recent Labs  Lab 10/25/18 0522 10/27/18 0315 10/30/18 0352  WBC 18.7* 16.5* 12.7*  HGB 12.9* 11.4* 10.6*  HCT 38.2* 33.5* 31.7*  MCV 88.0 88.6 89.5  PLT 295 219 [id number]   Basic Metabolic Panel: Recent Labs  Lab 10/25/18 0522 10/27/18 0315 10/30/18 0352 10/31/18 0252  NA 135 129* 124* 127*  K 4.9 4.7 4.9 4.6  CL 101 95* 91* 96*  CO2 25 20* 27 23  GLUCOSE 127* 113* 145* 321*  BUN 24* 27* 19 22  CREATININE 0.73 0.60* 0.70 0.65  CALCIUM 8.8* 8.2* 8.2* 8.2*  MG  --   --  1.9  --    GFR: Estimated Creatinine Clearance: 68.3 mL/min (by C-G formula based on SCr of 0.65 mg/dL). Liver Function Tests: Recent Labs  Lab 10/25/18 0522 10/27/18 0315 10/30/18 0352  AST 38 34 33  ALT 62* 70* 65*  ALKPHOS 54 49 59  BILITOT 0.8 0.7 0.8  PROT 5.9* 5.0* 5.7*  ALBUMIN 2.7* 2.3* 2.3*   No results for input(s): LIPASE, AMYLASE in the last 168 hours. No results for input(s): AMMONIA in the last 168 hours. Coagulation Profile: No results for  input(s): INR, PROTIME in the last 168 hours. Cardiac Enzymes: No results for input(s): CKTOTAL, CKMB, CKMBINDEX, TROPONINI in the last 168 hours. BNP (last 3 results) No results for input(s): PROBNP in the last 8760 hours. HbA1C: Recent Labs    10/29/18 0520  HGBA1C 7.4*   CBG: Recent Labs  Lab 10/30/18 0749 10/30/18 1205 10/30/18 1732 10/30/18 2139 10/31/18 0730  GLUCAP 144* 259* 208* 420* 171*   Lipid Profile: No results for input(s): CHOL, HDL, LDLCALC, TRIG, CHOLHDL, LDLDIRECT in the last 72 hours. Thyroid Function Tests: No results for input(s): TSH, T4TOTAL, FREET4, T3FREE, THYROIDAB in the last 72 hours. Anemia Panel: No results for input(s): VITAMINB12, FOLATE, FERRITIN, TIBC, IRON, RETICCTPCT in the last 72 hours. Urine analysis:    Component Value Date/Time   COLORURINE AMBER (A) 10/15/2018 1730   APPEARANCEUR HAZY (A) 10/15/2018 1730   LABSPEC 1.019 10/15/2018 1730   PHURINE 5.0 10/15/2018 1730  GLUCOSEU NEGATIVE 10/15/2018 [place] 10/15/2018 1730   HGBUR small 11/18/2009 0910   BILIRUBINUR NEGATIVE 10/15/2018 1730   BILIRUBINUR neg 10/25/2015 1141   [place] 10/15/2018 1730   PROTEINUR 30 (A) 10/15/2018 1730   UROBILINOGEN 0.2 10/25/2015 1141   UROBILINOGEN negative 11/18/2009 0910   NITRITE NEGATIVE 10/15/2018 1730   LEUKOCYTESUR NEGATIVE 10/15/2018 1730   Sepsis Labs: Invalid input(s): PROCALCITONIN, LACTICIDVEN  No results found for this or any previous visit (from the past 240 hour(s)).    Radiology Studies: No results found.   Marzetta Board, MD, PhD [facility]  Contact via  [email or web address]  [place] P: [phone] F: [phone]

## 2018-11-01 LAB — BASIC METABOLIC PANEL
Anion gap: 8 (ref 5–15)
BUN: 16 mg/dL (ref 8–23)
CO2: 24 mmol/L (ref 22–32)
Calcium: 8.4 mg/dL — ABNORMAL LOW (ref 8.9–10.3)
Chloride: 98 mmol/L (ref 98–111)
Creatinine, Ser: 0.65 mg/dL (ref 0.61–1.24)
GFR calc Af Amer: >60 mL/min (ref >60)
GFR calc non Af Amer: >60 mL/min (ref >60)
Glucose, Bld: 292 mg/dL — ABNORMAL HIGH (ref 70–99)
Potassium: 5.5 mmol/L — ABNORMAL HIGH (ref 3.5–5.1)
Sodium: 130 mmol/L — ABNORMAL LOW (ref 135–145)

## 2018-11-01 LAB — C-REACTIVE PROTEIN: CRP: 12.5 mg/dL — ABNORMAL HIGH (ref <1.0)

## 2018-11-01 LAB — GLUCOSE, CAPILLARY
Glucose-Capillary: 236 mg/dL — ABNORMAL HIGH (ref 70–99)
Glucose-Capillary: 289 mg/dL — ABNORMAL HIGH (ref 70–99)
Glucose-Capillary: 370 mg/dL — ABNORMAL HIGH (ref 70–99)
Glucose-Capillary: 355 mg/dL — ABNORMAL HIGH (ref 70–99)

## 2018-11-01 LAB — D-DIMER, QUANTITATIVE: D-Dimer, Quant: 1.12 ug/mL-FEU — ABNORMAL HIGH (ref 0.00–0.50)

## 2018-11-01 MED ORDER — INSULIN ASPART 100 UNIT/ML ~~LOC~~ SOLN
6.0000 [IU] | Freq: Three times a day (TID) | SUBCUTANEOUS | Status: DC
  Administered 2018-11-01 – 2018-11-02 (×4): 6 [IU] via SUBCUTANEOUS

## 2018-11-01 MED ORDER — FUROSEMIDE 10 MG/ML IJ SOLN
20.0000 mg | Freq: Once | INTRAMUSCULAR | Status: AC
  Administered 2018-11-01: 20 mg via INTRAVENOUS
  Filled 2018-11-01: qty 2

## 2018-11-01 NOTE — Progress Notes (Signed)
PROGRESS NOTE  Eddie Mcdaniel [id number] DOB: August 12, 1940 DOA: 10/15/2018 PCP: Ann Held, DO   LOS: 17 days   Brief Narrative / Interim history: 78 year old male with CAD status post PCI 2017, hypertension, diabetes mellitus who was admitted to the hospital on 10/15/2018 1:20 week of fever, fatigue, malaise, anosmia.  Chest x-ray on admission showed multifocal pneumonia, he was hypoxic and was admitted to the hospital.  He finished a course of Remdesivir, he received convalescent plasma on 9/19 and finished steroids as well.  Hospital course complicated by persistent hypoxia  Subjective / 24h Interval events: Feeling overall stable, breathing about the same, no nausea or vomiting.  Complains of a mild headache  Assessment & Plan: Principal Problem:   COVID-19 virus infection Active Problems:   Uncontrolled type 2 diabetes mellitus without complication, without long-term current use of insulin   Hyperlipidemia LDL goal <70   Essential hypertension   Coronary artery disease involving native coronary artery of native heart without angina pectoris   Acute respiratory failure with hypoxia (HCC)   Abnormal liver function   Hyponatremia   Principal Problem Acute Hypoxic Respiratory Failure due to Covid-19 Viral Illness -Completed treatment with Remdesivir, steroids, received convalescent plasma on 9/19 -Flutter valve, incentive spirometry -Lasix x1 on 10/30, that resulted in hyponatremia with sodium has improved, will repeat Lasix at lower dose today -Seems to be overall improving based on PT notes -Curbside pulmonology over the phone, may have post COVID pneumonitis given persistent hypoxia, will attempt a course of steroids and he was started on 40 mg of prednisone  COVID-19 Labs  Recent Labs    10/30/18 0352 10/31/18 0252 11/01/18 0340  DDIMER 1.67* 1.44* 1.12*  CRP 8.9* 9.9* 12.5*    Lab Results  Component Value Date   SARSCOV2NAA POSITIVE (A) 10/15/2018     Active Problems Hypertension / CAD / HLD -Continue Norvasc, Coreg, aspirin, statin.  Blood pressure controlled   Type 2 diabetes, non-insulin-dependent, very brittle with episodes of hyper and hypoglycemia -CBGs elevated, further increase mealtime insulin today  CBG (last 3)  Recent Labs    10/31/18 1715 10/31/18 2126 11/01/18 0733  GLUCAP 346* 374* 236*   Hyponatremia  -Likely hypovolemic, worsened to 124 after Lasix, but now gradually improving and 130 this morning.  Repeat Lasix as above at a lower dose  Positive blood culture -Diphtheroids in 1/4, likely contaminant.  Stable, afebrile, continue to monitor off antibiotics  Disposition: -Remains quite hypoxic, worse with exertion but gradually improving.  PT/OT recommended home health.  Hopefully home in a couple of days   Scheduled Meds: . albuterol  2 puff Inhalation Q6H  . amLODipine  5 mg Oral Daily  . aspirin EC  81 mg Oral Daily  . atorvastatin  20 mg Oral QPM  . carvedilol  12.5 mg Oral BID WC  . enoxaparin (LOVENOX) injection  40 mg Subcutaneous Q24H  . feeding supplement (GLUCERNA SHAKE)  237 mL Oral TID BM  . influenza vaccine adjuvanted  0.5 mL Intramuscular Tomorrow-1000  . insulin aspart  1-5 Units Subcutaneous TID WC  . insulin aspart  4 Units Subcutaneous TID WC  . mouth rinse  15 mL Mouth Rinse BID  . oxymetazoline  1 spray Each Nare BID  . predniSONE  40 mg Oral Q breakfast  . sodium chloride flush  3 mL Intravenous Once   Continuous Infusions: PRN Meds:.acetaminophen, dextrose, guaiFENesin-dextromethorphan, lip balm, oxyCODONE, sodium chloride  DVT prophylaxis: Lovenox Code Status: Full code Family  Communication: d/w son Konrad Dolores [phone] Disposition Plan: home when ready   Consultants:  None   Procedures:  None   Microbiology: 10/15/2018 blood cultures 1 out of 4 bottles with diphtheroids  Antimicrobials: None    Objective: Vitals:   10/31/18 1715 10/31/18 1929 11/01/18 0405  11/01/18 0733  BP: 111/60 (!) 130/59 121/68 110/62  Pulse: 70 73 90 65  Resp: 19 18 19 16   Temp: 98.4 F (36.9 C) 98.6 F (37 C) 97.9 F (36.6 C) 99.3 F (37.4 C)  TempSrc: Oral Oral Oral Oral  SpO2: 95% 91% 95% 97%  Weight:      Height:        Intake/Output Summary (Last 24 hours) at 11/01/2018 1148 Last data filed at 11/01/2018 1043 Gross per 24 hour  Intake -  Output 600 ml  Net -600 ml   Filed Weights   10/16/18 0100 10/16/18 0309  Weight: 66.2 kg 63.4 kg    Examination:  Constitutional: Laying in bed, no distress Eyes: No scleral icterus ENMT: Moist mixed membranes Respiratory: Diminished at the bases, no wheezing Cardiovascular: Regular rate and rhythm, no murmurs.  No edema Abdomen: Soft, nondistended, positive bowel sounds Musculoskeletal: no clubbing / cyanosis. Skin: No new rashes Neurologic: No focal deficits   Data Reviewed: I have independently reviewed following labs and imaging studies   CBC: Recent Labs  Lab 10/27/18 0315 10/30/18 0352  WBC 16.5* 12.7*  HGB 11.4* 10.6*  HCT 33.5* 31.7*  MCV 88.6 89.5  PLT 219 [id number]   Basic Metabolic Panel: Recent Labs  Lab 10/27/18 0315 10/30/18 0352 10/31/18 0252 11/01/18 0340  NA 129* 124* 127* 130*  K 4.7 4.9 4.6 5.5*  CL 95* 91* 96* 98  CO2 20* 27 23 24   GLUCOSE 113* 145* 321* 292*  BUN 27* 19 22 16   CREATININE 0.60* 0.70 0.65 0.65  CALCIUM 8.2* 8.2* 8.2* 8.4*  MG  --  1.9  --   --    GFR: Estimated Creatinine Clearance: 68.3 mL/min (by C-G formula based on SCr of 0.65 mg/dL). Liver Function Tests: Recent Labs  Lab 10/27/18 0315 10/30/18 0352  AST 34 33  ALT 70* 65*  ALKPHOS 49 59  BILITOT 0.7 0.8  PROT 5.0* 5.7*  ALBUMIN 2.3* 2.3*   No results for input(s): LIPASE, AMYLASE in the last 168 hours. No results for input(s): AMMONIA in the last 168 hours. Coagulation Profile: No results for input(s): INR, PROTIME in the last 168 hours. Cardiac Enzymes: No results for input(s):  CKTOTAL, CKMB, CKMBINDEX, TROPONINI in the last 168 hours. BNP (last 3 results) No results for input(s): PROBNP in the last 8760 hours. HbA1C: No results for input(s): HGBA1C in the last 72 hours. CBG: Recent Labs  Lab 10/31/18 0730 10/31/18 1131 10/31/18 1715 10/31/18 2126 11/01/18 0733  GLUCAP 171* 282* 346* 374* 236*   Lipid Profile: No results for input(s): CHOL, HDL, LDLCALC, TRIG, CHOLHDL, LDLDIRECT in the last 72 hours. Thyroid Function Tests: No results for input(s): TSH, T4TOTAL, FREET4, T3FREE, THYROIDAB in the last 72 hours. Anemia Panel: No results for input(s): VITAMINB12, FOLATE, FERRITIN, TIBC, IRON, RETICCTPCT in the last 72 hours. Urine analysis:    Component Value Date/Time   COLORURINE AMBER (A) 10/15/2018 1730   APPEARANCEUR HAZY (A) 10/15/2018 1730   LABSPEC 1.019 10/15/2018 1730   PHURINE 5.0 10/15/2018 1730   GLUCOSEU NEGATIVE 10/15/2018 1730   HGBUR NEGATIVE 10/15/2018 1730   HGBUR small 11/18/2009 0910   BILIRUBINUR NEGATIVE 10/15/2018 1730  BILIRUBINUR neg 10/25/2015 1141   KETONESUR NEGATIVE 10/15/2018 1730   PROTEINUR 30 (A) 10/15/2018 1730   UROBILINOGEN 0.2 10/25/2015 1141   UROBILINOGEN negative 11/18/2009 0910   NITRITE NEGATIVE 10/15/2018 1730   LEUKOCYTESUR NEGATIVE 10/15/2018 1730   Sepsis Labs: Invalid input(s): PROCALCITONIN, LACTICIDVEN  No results found for this or any previous visit (from the past 240 hour(s)).    Radiology Studies: Dg Chest Port 1 View  Result Date: 10/31/2018 CLINICAL DATA:  Hypoxia. EXAM: PORTABLE CHEST 1 VIEW COMPARISON:  Radiograph of October 17, 2018. CT scan of October 20, 2018. FINDINGS: Stable cardiomediastinal silhouette. Increased bilateral lung opacities are noted concerning for worsening multifocal pneumonia. No definite pneumothorax or pleural effusion is noted. Bony thorax is unremarkable. IMPRESSION: Increased bilateral lung opacities are noted concerning for worsening multifocal pneumonia.  Electronically Signed   By: Marijo Conception M.D.   On: 10/31/2018 14:06     Marzetta Board, MD, PhD [facility]  Contact via  [email or web address]  [place] P: [phone] F: [phone]

## 2018-11-01 NOTE — Plan of Care (Signed)
  Problem: Education: Goal: Knowledge of risk factors and measures for prevention of condition will improve 11/01/2018 1101 by Orvan Falconer, RN Outcome: Progressing 11/01/2018 1101 by Orvan Falconer, RN Outcome: Progressing   Problem: Respiratory: Goal: Will maintain a patent airway 11/01/2018 1101 by Orvan Falconer, RN Outcome: Progressing 11/01/2018 1101 by Orvan Falconer, RN Outcome: Progressing Goal: Complications related to the disease process, condition or treatment will be avoided or minimized 11/01/2018 1101 by Orvan Falconer, RN Outcome: Progressing 11/01/2018 1101 by Orvan Falconer, RN Outcome: Progressing   Problem: Education: Goal: Knowledge of General Education information will improve Description: Including pain rating scale, medication(s)/side effects and non-pharmacologic comfort measures 11/01/2018 1101 by Orvan Falconer, RN Outcome: Progressing 11/01/2018 1101 by Orvan Falconer, RN Outcome: Progressing   Problem: Health Behavior/Discharge Planning: Goal: Ability to manage health-related needs will improve 11/01/2018 1101 by Orvan Falconer, RN Outcome: Progressing 11/01/2018 1101 by Orvan Falconer, RN Outcome: Progressing   Problem: Clinical Measurements: Goal: Ability to maintain clinical measurements within normal limits will improve 11/01/2018 1101 by Orvan Falconer, RN Outcome: Progressing 11/01/2018 1101 by Orvan Falconer, RN Outcome: Progressing Goal: Will remain free from infection 11/01/2018 1101 by Orvan Falconer, RN Outcome: Progressing 11/01/2018 1101 by Orvan Falconer, RN Outcome: Progressing Goal: Diagnostic test results will improve 11/01/2018 1101 by Orvan Falconer, RN Outcome: Progressing 11/01/2018 1101 by Orvan Falconer, RN Outcome: Progressing Goal: Respiratory complications will improve 11/01/2018 1101 by Orvan Falconer, RN Outcome: Progressing 11/01/2018 1101 by Orvan Falconer, RN Outcome: Progressing Goal: Cardiovascular complication will be avoided 11/01/2018 1101 by Orvan Falconer, RN Outcome: Progressing 11/01/2018 1101 by Orvan Falconer, RN Outcome: Progressing   Problem: Activity: Goal: Risk for activity intolerance will decrease 11/01/2018 1101 by Orvan Falconer, RN Outcome: Progressing 11/01/2018 1101 by Orvan Falconer, RN Outcome: Progressing   Problem: Nutrition: Goal: Adequate nutrition will be maintained 11/01/2018 1101 by Orvan Falconer, RN Outcome: Progressing 11/01/2018 1101 by Orvan Falconer, RN Outcome: Progressing   Problem: Coping: Goal: Level of anxiety will decrease 11/01/2018 1101 by Orvan Falconer, RN Outcome: Progressing 11/01/2018 1101 by Orvan Falconer, RN Outcome: Progressing   Problem: Elimination: Goal: Will not experience complications related to bowel motility 11/01/2018 1101 by Orvan Falconer, RN Outcome: Progressing 11/01/2018 1101 by Orvan Falconer, RN Outcome: Progressing Goal: Will not experience complications related to urinary retention 11/01/2018 1101 by Orvan Falconer, RN Outcome: Progressing 11/01/2018 1101 by Orvan Falconer, RN Outcome: Progressing   Problem: Pain Managment: Goal: General experience of comfort will improve 11/01/2018 1101 by Orvan Falconer, RN Outcome: Progressing 11/01/2018 1101 by Orvan Falconer, RN Outcome: Progressing   Problem: Safety: Goal: Ability to remain free from injury will improve 11/01/2018 1101 by Orvan Falconer, RN Outcome: Progressing 11/01/2018 1101 by Orvan Falconer, RN Outcome: Progressing   Problem: Skin Integrity: Goal: Risk for impaired skin integrity will decrease 11/01/2018 1101 by Orvan Falconer, RN Outcome: Progressing 11/01/2018 1101 by Orvan Falconer, RN Outcome: Progressing

## 2018-11-02 LAB — GLUCOSE, CAPILLARY
Glucose-Capillary: 259 mg/dL — ABNORMAL HIGH (ref 70–99)
Glucose-Capillary: 389 mg/dL — ABNORMAL HIGH (ref 70–99)
Glucose-Capillary: 246 mg/dL — ABNORMAL HIGH (ref 70–99)
Glucose-Capillary: 368 mg/dL — ABNORMAL HIGH (ref 70–99)

## 2018-11-02 LAB — BASIC METABOLIC PANEL
Anion gap: 7 (ref 5–15)
BUN: 20 mg/dL (ref 8–23)
CO2: 25 mmol/L (ref 22–32)
Calcium: 8.3 mg/dL — ABNORMAL LOW (ref 8.9–10.3)
Chloride: 98 mmol/L (ref 98–111)
Creatinine, Ser: 0.67 mg/dL (ref 0.61–1.24)
GFR calc Af Amer: >60 mL/min (ref >60)
GFR calc non Af Amer: >60 mL/min (ref >60)
Glucose, Bld: 383 mg/dL — ABNORMAL HIGH (ref 70–99)
Potassium: 4.9 mmol/L (ref 3.5–5.1)
Sodium: 130 mmol/L — ABNORMAL LOW (ref 135–145)

## 2018-11-02 LAB — C-REACTIVE PROTEIN: CRP: 7.9 mg/dL — ABNORMAL HIGH (ref <1.0)

## 2018-11-02 LAB — D-DIMER, QUANTITATIVE: D-Dimer, Quant: 0.78 ug/mL-FEU — ABNORMAL HIGH (ref 0.00–0.50)

## 2018-11-02 MED ORDER — NAPHAZOLINE-GLYCERIN 0.012-0.2 % OP SOLN: 1.0000 [drp] | Freq: Four times a day (QID) | OPHTHALMIC | Status: DC | PRN

## 2018-11-02 MED ORDER — INSULIN GLARGINE 100 UNIT/ML ~~LOC~~ SOLN
10.0000 [IU] | Freq: Every day | SUBCUTANEOUS | Status: DC
  Administered 2018-11-02 – 2018-11-03 (×2): 10 [IU] via SUBCUTANEOUS
  Filled 2018-11-02 (×2): qty 0.1

## 2018-11-02 MED ORDER — FUROSEMIDE 10 MG/ML IJ SOLN
20.0000 mg | Freq: Once | INTRAMUSCULAR | Status: AC
  Administered 2018-11-02: 13:00:00 20 mg via INTRAVENOUS
  Filled 2018-11-02: qty 2

## 2018-11-02 MED ORDER — PREDNISONE 20 MG PO TABS
20.0000 mg | ORAL_TABLET | Freq: Every day | ORAL | Status: DC
  Administered 2018-11-03 – 2018-11-04 (×2): 20 mg via ORAL
  Filled 2018-11-02 (×2): qty 1

## 2018-11-02 MED ORDER — INSULIN ASPART 100 UNIT/ML ~~LOC~~ SOLN
10.0000 [IU] | Freq: Three times a day (TID) | SUBCUTANEOUS | Status: DC
  Administered 2018-11-02 – 2018-11-04 (×6): 10 [IU] via SUBCUTANEOUS

## 2018-11-02 NOTE — Progress Notes (Signed)
PROGRESS NOTE  Eddie Mcdaniel [id number] DOB: 1940/02/06 DOA: 10/15/2018 PCP: Ann Held, DO   LOS: 18 days   Brief Narrative / Interim history: 78 year old male with CAD status post PCI 2017, hypertension, diabetes mellitus who was admitted to the hospital on 10/15/2018 1:20 week of fever, fatigue, malaise, anosmia.  Chest x-ray on admission showed multifocal pneumonia, he was hypoxic and was admitted to the hospital.  He finished a course of Remdesivir, he received convalescent plasma on 9/19 and finished steroids as well.  Hospital course complicated by persistent hypoxia  Subjective / 24h Interval events: No significant dyspnea, overall feeling well.  Assessment & Plan: Principal Problem:   COVID-19 virus infection Active Problems:   Uncontrolled type 2 diabetes mellitus without complication, without long-term current use of insulin   Hyperlipidemia LDL goal <70   Essential hypertension   Coronary artery disease involving native coronary artery of native heart without angina pectoris   Acute respiratory failure with hypoxia (HCC)   Abnormal liver function   Hyponatremia   Principal Problem Acute Hypoxic Respiratory Failure due to Covid-19 Viral Illness -Completed treatment with Remdesivir, steroids, received convalescent plasma on 9/19 -Flutter valve, incentive spirometry -Lasix x1 on 9/30, causing a little bit of hyponatremia, repeat Lasix on 10/3 sodium is stable.  Net -7.8 L.  Sodium stable at 130.  Repeat Lasix again today. -Seems to be overall improving based on PT notes -Curbside pulmonology over the phone, may have post COVID pneumonitis given persistent hypoxia, will attempt a course of steroids and he was started on 40 mg of prednisone.  Decreased to 20 tomorrow given hyperglycemia  COVID-19 Labs  Recent Labs    10/31/18 0252 11/01/18 0340 11/02/18 0303  DDIMER 1.44* 1.12* 0.78*  CRP 9.9* 12.5* 7.9*    Lab Results  Component Value Date   SARSCOV2NAA POSITIVE (A) 10/15/2018    Active Problems Hypertension / CAD / HLD -Continue Norvasc, Coreg, aspirin, statin.  Blood pressure controlled   Type 2 diabetes, non-insulin-dependent, very brittle with episodes of hyper and hypoglycemia -CBGs elevated, further increase mealtime insulin today  CBG (last 3)  Recent Labs    11/01/18 2117 11/02/18 0803 11/02/18 1149  GLUCAP 355* 259* 389*   Hyponatremia  -Likely hypovolemic, worsened to 124 after Lasix, but now gradually improving and 130 this morning.  Repeat Lasix as above at a lower dose  Positive blood culture -Diphtheroids in 1/4, likely contaminant.  Stable, afebrile, continue to monitor off antibiotics  Disposition: -Remains quite hypoxic, worse with exertion but gradually improving.  PT/OT recommended home health.  Hopefully home in a couple of days -Reassess oxygen needs with ambulation today   Scheduled Meds: . albuterol  2 puff Inhalation Q6H  . amLODipine  5 mg Oral Daily  . aspirin EC  81 mg Oral Daily  . atorvastatin  20 mg Oral QPM  . carvedilol  12.5 mg Oral BID WC  . enoxaparin (LOVENOX) injection  40 mg Subcutaneous Q24H  . feeding supplement (GLUCERNA SHAKE)  237 mL Oral TID BM  . influenza vaccine adjuvanted  0.5 mL Intramuscular Tomorrow-1000  . insulin aspart  1-5 Units Subcutaneous TID WC  . insulin aspart  6 Units Subcutaneous TID WC  . insulin glargine  10 Units Subcutaneous Daily  . mouth rinse  15 mL Mouth Rinse BID  . oxymetazoline  1 spray Each Nare BID  . predniSONE  40 mg Oral Q breakfast  . sodium chloride flush  3 mL Intravenous Once  Continuous Infusions: PRN Meds:.acetaminophen, dextrose, guaiFENesin-dextromethorphan, lip balm, oxyCODONE, sodium chloride  DVT prophylaxis: Lovenox Code Status: Full code Family Communication: d/w son Konrad Dolores [phone] Disposition Plan: home when ready   Consultants:  None   Procedures:  None   Microbiology: 10/15/2018 blood cultures 1  out of 4 bottles with diphtheroids  Antimicrobials: None    Objective: Vitals:   11/02/18 0540 11/02/18 0541 11/02/18 0542 11/02/18 0714  BP:    (!) 145/69  Pulse:    73  Resp:    18  Temp:    99.8 F (37.7 C)  TempSrc:    Oral  SpO2: 97% 97% 97% 90%  Weight:      Height:        Intake/Output Summary (Last 24 hours) at 11/02/2018 1210 Last data filed at 11/02/2018 0426 Gross per 24 hour  Intake -  Output 750 ml  Net -750 ml   Filed Weights   10/16/18 0100 10/16/18 0309  Weight: 66.2 kg 63.4 kg    Examination:  Constitutional: No distress Eyes: No scleral icterus ENMT: Moist mucous membranes Respiratory: Diminished at the bases, no wheezing, no crackles Cardiovascular: Regular rate and rhythm, no murmurs appreciated.  No peripheral edema Abdomen: Soft, nondistended, bowel sounds positive Musculoskeletal: no clubbing / cyanosis. Skin: No rashes seen Neurologic: No focal deficits, equal strength   Data Reviewed: I have independently reviewed following labs and imaging studies   CBC: Recent Labs  Lab 10/27/18 0315 10/30/18 0352  WBC 16.5* 12.7*  HGB 11.4* 10.6*  HCT 33.5* 31.7*  MCV 88.6 89.5  PLT 219 [id number]   Basic Metabolic Panel: Recent Labs  Lab 10/27/18 0315 10/30/18 0352 10/31/18 0252 11/01/18 0340 11/02/18 0303  NA 129* 124* 127* 130* 130*  K 4.7 4.9 4.6 5.5* 4.9  CL 95* 91* 96* 98 98  CO2 20* 27 23 24 25   GLUCOSE 113* 145* 321* 292* 383*  BUN 27* 19 22 16 20   CREATININE 0.60* 0.70 0.65 0.65 0.67  CALCIUM 8.2* 8.2* 8.2* 8.4* 8.3*  MG  --  1.9  --   --   --    GFR: Estimated Creatinine Clearance: 68.3 mL/min (by C-G formula based on SCr of 0.67 mg/dL). Liver Function Tests: Recent Labs  Lab 10/27/18 0315 10/30/18 0352  AST 34 33  ALT 70* 65*  ALKPHOS 49 59  BILITOT 0.7 0.8  PROT 5.0* 5.7*  ALBUMIN 2.3* 2.3*   No results for input(s): LIPASE, AMYLASE in the last 168 hours. No results for input(s): AMMONIA in the last 168 hours.  Coagulation Profile: No results for input(s): INR, PROTIME in the last 168 hours. Cardiac Enzymes: No results for input(s): CKTOTAL, CKMB, CKMBINDEX, TROPONINI in the last 168 hours. BNP (last 3 results) No results for input(s): PROBNP in the last 8760 hours. HbA1C: No results for input(s): HGBA1C in the last 72 hours. CBG: Recent Labs  Lab 11/01/18 1147 11/01/18 1625 11/01/18 2117 11/02/18 0803 11/02/18 1149  GLUCAP 289* 370* 355* 259* 389*   Lipid Profile: No results for input(s): CHOL, HDL, LDLCALC, TRIG, CHOLHDL, LDLDIRECT in the last 72 hours. Thyroid Function Tests: No results for input(s): TSH, T4TOTAL, FREET4, T3FREE, THYROIDAB in the last 72 hours. Anemia Panel: No results for input(s): VITAMINB12, FOLATE, FERRITIN, TIBC, IRON, RETICCTPCT in the last 72 hours. Urine analysis:    Component Value Date/Time   COLORURINE AMBER (A) 10/15/2018 1730   APPEARANCEUR HAZY (A) 10/15/2018 1730   LABSPEC 1.019 10/15/2018 1730   PHURINE 5.0  10/15/2018 1730   GLUCOSEU NEGATIVE 10/15/2018 1730   HGBUR NEGATIVE 10/15/2018 1730   HGBUR small 11/18/2009 0910   BILIRUBINUR NEGATIVE 10/15/2018 1730   BILIRUBINUR neg 10/25/2015 1141   KETONESUR NEGATIVE 10/15/2018 1730   PROTEINUR 30 (A) 10/15/2018 1730   UROBILINOGEN 0.2 10/25/2015 1141   UROBILINOGEN negative 11/18/2009 0910   NITRITE NEGATIVE 10/15/2018 1730   LEUKOCYTESUR NEGATIVE 10/15/2018 1730   Sepsis Labs: Invalid input(s): PROCALCITONIN, LACTICIDVEN  No results found for this or any previous visit (from the past 240 hour(s)).    Radiology Studies: Dg Chest Port 1 View  Result Date: 10/31/2018 CLINICAL DATA:  Hypoxia. EXAM: PORTABLE CHEST 1 VIEW COMPARISON:  Radiograph of October 17, 2018. CT scan of October 20, 2018. FINDINGS: Stable cardiomediastinal silhouette. Increased bilateral lung opacities are noted concerning for worsening multifocal pneumonia. No definite pneumothorax or pleural effusion is noted. Bony  thorax is unremarkable. IMPRESSION: Increased bilateral lung opacities are noted concerning for worsening multifocal pneumonia. Electronically Signed   By: Marijo Conception M.D.   On: 10/31/2018 14:06     Marzetta Board, MD, PhD [facility]  Contact via  [email or web address]  [place] P: [phone] F: [phone]

## 2018-11-02 NOTE — Progress Notes (Addendum)
Patient O2 saturation stayed between 92-94% while ambulating on 3L O2 via nasal cannula.

## 2018-11-03 LAB — GLUCOSE, CAPILLARY
Glucose-Capillary: 216 mg/dL — ABNORMAL HIGH (ref 70–99)
Glucose-Capillary: 426 mg/dL — ABNORMAL HIGH (ref 70–99)
Glucose-Capillary: 253 mg/dL — ABNORMAL HIGH (ref 70–99)
Glucose-Capillary: 317 mg/dL — ABNORMAL HIGH (ref 70–99)

## 2018-11-03 LAB — D-DIMER, QUANTITATIVE: D-Dimer, Quant: 0.78 ug/mL-FEU — ABNORMAL HIGH (ref 0.00–0.50)

## 2018-11-03 LAB — BASIC METABOLIC PANEL
Anion gap: 8 (ref 5–15)
BUN: 18 mg/dL (ref 8–23)
CO2: 27 mmol/L (ref 22–32)
Calcium: 8.8 mg/dL — ABNORMAL LOW (ref 8.9–10.3)
Chloride: 95 mmol/L — ABNORMAL LOW (ref 98–111)
Creatinine, Ser: 0.66 mg/dL (ref 0.61–1.24)
GFR calc Af Amer: >60 mL/min (ref >60)
GFR calc non Af Amer: >60 mL/min (ref >60)
Glucose, Bld: 257 mg/dL — ABNORMAL HIGH (ref 70–99)
Potassium: 4.7 mmol/L (ref 3.5–5.1)
Sodium: 130 mmol/L — ABNORMAL LOW (ref 135–145)

## 2018-11-03 LAB — CBC
HCT: 30.7 % — ABNORMAL LOW (ref 39.0–52.0)
Hemoglobin: 10.2 g/dL — ABNORMAL LOW (ref 13.0–17.0)
MCH: 29.7 pg (ref 26.0–34.0)
MCHC: 33.2 g/dL (ref 30.0–36.0)
MCV: 89.5 fL (ref 80.0–100.0)
Platelets: 208 10*3/uL (ref 150–400)
RBC: 3.43 MIL/uL — ABNORMAL LOW (ref 4.22–5.81)
RDW: 13.5 % (ref 11.5–15.5)
WBC: 11.3 10*3/uL — ABNORMAL HIGH (ref 4.0–10.5)
nRBC: 0 % (ref 0.0–0.2)

## 2018-11-03 LAB — C-REACTIVE PROTEIN: CRP: 5.8 mg/dL — ABNORMAL HIGH (ref <1.0)

## 2018-11-03 MED ORDER — ONDANSETRON HCL 4 MG/2ML IJ SOLN
4.0000 mg | Freq: Three times a day (TID) | INTRAMUSCULAR | Status: DC | PRN
  Filled 2018-11-03: qty 2

## 2018-11-03 MED ORDER — INSULIN ASPART 100 UNIT/ML ~~LOC~~ SOLN
0.0000 [IU] | Freq: Three times a day (TID) | SUBCUTANEOUS | Status: DC
  Administered 2018-11-03: 5 [IU] via SUBCUTANEOUS
  Administered 2018-11-04: 2 [IU] via SUBCUTANEOUS

## 2018-11-03 MED ORDER — INSULIN GLARGINE 100 UNIT/ML ~~LOC~~ SOLN
10.0000 [IU] | Freq: Two times a day (BID) | SUBCUTANEOUS | Status: DC
  Administered 2018-11-03 – 2018-11-04 (×2): 10 [IU] via SUBCUTANEOUS
  Filled 2018-11-03 (×3): qty 0.1

## 2018-11-03 MED ORDER — METFORMIN HCL ER 750 MG PO TB24
750.0000 mg | ORAL_TABLET | Freq: Every day | ORAL | Status: DC
  Administered 2018-11-03 – 2018-11-04 (×2): 750 mg via ORAL
  Filled 2018-11-03 (×3): qty 1

## 2018-11-03 MED ORDER — FUROSEMIDE 10 MG/ML IJ SOLN
20.0000 mg | Freq: Once | INTRAMUSCULAR | Status: AC
  Administered 2018-11-03: 20 mg via INTRAVENOUS
  Filled 2018-11-03: qty 2

## 2018-11-03 MED ORDER — INSULIN ASPART 100 UNIT/ML ~~LOC~~ SOLN
0.0000 [IU] | Freq: Every day | SUBCUTANEOUS | Status: DC
  Administered 2018-11-03: 4 [IU] via SUBCUTANEOUS

## 2018-11-03 NOTE — Progress Notes (Signed)
PROGRESS NOTE  Eddie Mcdaniel [id number] DOB: [birth year] DOA: 10/15/2018 PCP: Ann Held, DO   LOS: 19 days   Brief Narrative / Interim history: 78 year old male with CAD status post PCI 2017, hypertension, diabetes mellitus who was admitted to the hospital on 10/15/2018 1:20 week of fever, fatigue, malaise, anosmia.  Chest x-ray on admission showed multifocal pneumonia, he was hypoxic and was admitted to the hospital.  He finished a course of Remdesivir, he received convalescent plasma on 9/19 and finished steroids as well.  Hospital course complicated by persistent hypoxia  Subjective / 24h Interval events: Feeling well, able to ambulate yesterday on 3 L without significant dyspnea.  He felt quite short of breath without oxygen.  Assessment & Plan: Principal Problem:   COVID-19 virus infection Active Problems:   Uncontrolled type 2 diabetes mellitus without complication, without long-term current use of insulin   Hyperlipidemia LDL goal <70   Essential hypertension   Coronary artery disease involving native coronary artery of native heart without angina pectoris   Acute respiratory failure with hypoxia (HCC)   Abnormal liver function   Hyponatremia   Principal Problem Acute Hypoxic Respiratory Failure due to Covid-19 Viral Illness -Completed treatment with Remdesivir, steroids, received convalescent plasma on 9/19 -Flutter valve, incentive spirometry -Has been receiving intermittent Lasix, developed hyponatremia but now this has been corrected -Seems to be overall improving based on PT notes -Curbside pulmonology over the phone, may have post COVID pneumonitis given persistent hypoxia, attempt a course of steroids, however limited by persistent hyperglycemia  COVID-19 Labs  Recent Labs    11/01/18 0340 11/02/18 0303 11/03/18 0510  DDIMER 1.12* 0.78* 0.78*  CRP 12.5* 7.9* 5.8*    Lab Results  Component Value Date   SARSCOV2NAA POSITIVE (A) 10/15/2018     Active Problems Hypertension / CAD / HLD -Continue Norvasc, Coreg, aspirin, statin.  Blood pressure controlled   Type 2 diabetes, non-insulin-dependent, very brittle with episodes of hyper and hypoglycemia -CBGs remain elevated especially in the setting of steroids, increased basal along with scheduled.  Prednisone has been decreased to 20 today  CBG (last 3)  Recent Labs    11/02/18 2132 11/03/18 0753 11/03/18 1112  GLUCAP 368* 216* 426*   Hyponatremia  -Likely hypovolemic, worsened to 124 after Lasix, but now gradually improving and 130 this morning.  Sodium stable, repeat Lasix  Positive blood culture -Diphtheroids in 1/4, likely contaminant.  Stable, afebrile, continue to monitor off antibiotics  Disposition: -Remains quite hypoxic, worse with exertion but gradually improving.  PT/OT recommended home health.  Home tomorrow if respiratory status stable and CBGs stable   Scheduled Meds: . albuterol  2 puff Inhalation Q6H  . amLODipine  5 mg Oral Daily  . aspirin EC  81 mg Oral Daily  . atorvastatin  20 mg Oral QPM  . carvedilol  12.5 mg Oral BID WC  . enoxaparin (LOVENOX) injection  40 mg Subcutaneous Q24H  . feeding supplement (GLUCERNA SHAKE)  237 mL Oral TID BM  . influenza vaccine adjuvanted  0.5 mL Intramuscular Tomorrow-1000  . insulin aspart  1-5 Units Subcutaneous TID WC  . insulin aspart  10 Units Subcutaneous TID WC  . insulin glargine  10 Units Subcutaneous BID  . mouth rinse  15 mL Mouth Rinse BID  . metFORMIN  750 mg Oral Q breakfast  . oxymetazoline  1 spray Each Nare BID  . predniSONE  20 mg Oral Q breakfast  . sodium chloride flush  3 mL  Intravenous Once   Continuous Infusions: PRN Meds:.acetaminophen, dextrose, guaiFENesin-dextromethorphan, lip balm, ondansetron (ZOFRAN) IV, oxyCODONE, sodium chloride  DVT prophylaxis: Lovenox Code Status: Full code Family Communication: d/w son Konrad Dolores [phone] Disposition Plan: home when ready    Consultants:  None   Procedures:  None   Microbiology: 10/15/2018 blood cultures 1 out of 4 bottles with diphtheroids  Antimicrobials: None    Objective: Vitals:   11/02/18 1924 11/03/18 0336 11/03/18 0540 11/03/18 0750  BP: (!) 109/59 140/71  (!) 146/85  Pulse: 73 86  64  Resp: 18 17    Temp: 98.2 F (36.8 C) 98 F (36.7 C)    TempSrc: Oral Oral    SpO2: 96% 98% 92%   Weight:      Height:        Intake/Output Summary (Last 24 hours) at 11/03/2018 1143 Last data filed at 11/03/2018 0844 Gross per 24 hour  Intake -  Output 350 ml  Net -350 ml   Filed Weights   10/16/18 0100 10/16/18 0309  Weight: 66.2 kg 63.4 kg    Examination:  Constitutional: No distress Eyes: No scleral icterus ENMT: Moist mucous membranes Respiratory: No wheezing, no crackles, moves air well. Cardiovascular: Regular rate and rhythm, no murmurs Abdomen: Soft, NT, ND, bowel sounds positive Musculoskeletal: no clubbing / cyanosis. Skin: No rashes seen Neurologic: No focal deficits, equal strength   Data Reviewed: I have independently reviewed following labs and imaging studies   CBC: Recent Labs  Lab 10/30/18 0352 11/03/18 0510  WBC 12.7* 11.3*  HGB 10.6* 10.2*  HCT 31.7* 30.7*  MCV 89.5 89.5  PLT 200 [id number]   Basic Metabolic Panel: Recent Labs  Lab 10/30/18 0352 10/31/18 0252 11/01/18 0340 11/02/18 0303 11/03/18 0510  NA 124* 127* 130* 130* 130*  K 4.9 4.6 5.5* 4.9 4.7  CL 91* 96* 98 98 95*  CO2 27 23 24 25 27   GLUCOSE 145* 321* 292* 383* 257*  BUN 19 22 16 20 18   CREATININE 0.70 0.65 0.65 0.67 0.66  CALCIUM 8.2* 8.2* 8.4* 8.3* 8.8*  MG 1.9  --   --   --   --    GFR: Estimated Creatinine Clearance: 68.3 mL/min (by C-G formula based on SCr of 0.66 mg/dL). Liver Function Tests: Recent Labs  Lab 10/30/18 0352  AST 33  ALT 65*  ALKPHOS 59  BILITOT 0.8  PROT 5.7*  ALBUMIN 2.3*   No results for input(s): LIPASE, AMYLASE in the last 168 hours. No results for  input(s): AMMONIA in the last 168 hours. Coagulation Profile: No results for input(s): INR, PROTIME in the last 168 hours. Cardiac Enzymes: No results for input(s): CKTOTAL, CKMB, CKMBINDEX, TROPONINI in the last 168 hours. BNP (last 3 results) No results for input(s): PROBNP in the last 8760 hours. HbA1C: No results for input(s): HGBA1C in the last 72 hours. CBG: Recent Labs  Lab 11/02/18 1149 11/02/18 1633 11/02/18 2132 11/03/18 0753 11/03/18 1112  GLUCAP 389* 246* 368* 216* 426*   Lipid Profile: No results for input(s): CHOL, HDL, LDLCALC, TRIG, CHOLHDL, LDLDIRECT in the last 72 hours. Thyroid Function Tests: No results for input(s): TSH, T4TOTAL, FREET4, T3FREE, THYROIDAB in the last 72 hours. Anemia Panel: No results for input(s): VITAMINB12, FOLATE, FERRITIN, TIBC, IRON, RETICCTPCT in the last 72 hours. Urine analysis:    Component Value Date/Time   COLORURINE AMBER (A) 10/15/2018 1730   APPEARANCEUR HAZY (A) 10/15/2018 1730   LABSPEC 1.019 10/15/2018 1730   PHURINE 5.0 10/15/2018 1730  GLUCOSEU NEGATIVE 10/15/2018 [place] 10/15/2018 1730   HGBUR small 11/18/2009 0910   BILIRUBINUR NEGATIVE 10/15/2018 1730   BILIRUBINUR neg 10/25/2015 1141   [place] 10/15/2018 1730   PROTEINUR 30 (A) 10/15/2018 1730   UROBILINOGEN 0.2 10/25/2015 1141   UROBILINOGEN negative 11/18/2009 0910   NITRITE NEGATIVE 10/15/2018 1730   LEUKOCYTESUR NEGATIVE 10/15/2018 1730   Sepsis Labs: Invalid input(s): PROCALCITONIN, LACTICIDVEN  No results found for this or any previous visit (from the past 240 hour(s)).    Radiology Studies: No results found.   Marzetta Board, MD, PhD [facility]  Contact via  [email or web address]  [place] P: [phone] F: [phone]

## 2018-11-03 NOTE — Progress Notes (Signed)
Physical Therapy Treatment Patient Details Name: Eddie Mcdaniel MRN: [id number] DOB: Sep 02, 1940 Today's Date: 11/03/2018    History of Present Illness 78 y.o. male,  w hypertension, hyperlipidemia, Dm2, CAD, rt rotator cuff repair x2 apparently presents with c/o feeling sick, fever last week, and has had poor po intake and poor energy,  and odd bitter taste in his mouth. Pt denies any travel or covid exposure. Pt admitted 10/15/18 for acute respiratory failure with hypoxia secondary to Covid -19, CAP; +lt pleural effusion    PT Comments    Pt continues to need 3 L O2 [place] to keep O2 sats in the mid to upper 80s during gait with DOE 3/4 as measured by nellcor finger probe.  He does not need much physical assist, but has poor endurance a mild balance deficits.  He would benefit from follow up PT.  We discussed the RW and he states he does not think it will fit well around his small home.  Practice gait without AD next session.  PT will continue to follow acutely for safe mobility progression  Follow Up Recommendations  Home health PT;Supervision/Assistance - 24 hour     Equipment Recommendations  Other (comment)(home O2)    Recommendations for Other Services   NA     Precautions / Restrictions Precautions Precautions: Fall;Other (comment) Precaution Comments: monitor O2    Mobility  Bed Mobility Overal bed mobility: Needs Assistance Bed Mobility: Sit to Supine       Sit to supine: Min assist   General bed mobility comments: Min assist to help lift legs one at a time  Transfers Overall transfer level: Needs assistance Equipment used: 4-wheeled walker Transfers: Sit to/from Stand Sit to Stand: Supervision         General transfer comment: supervision for safety  Ambulation/Gait Ambulation/Gait assistance: Supervision Gait Distance (Feet): 130 Feet Assistive device: 4-wheeled walker Gait Pattern/deviations: Step-through pattern;Staggering right;Staggering left Gait  velocity: slow Gait velocity interpretation: <1.8 ft/sec, indicate of risk for recurrent falls General Gait Details: Pt with mildly staggering gait pattern.  O2 sats on 3L O@ [place] in the mid 80s with DOE 3/4 with gait.  Measured via nellcor finger probe          Balance Overall balance assessment: Needs assistance Sitting-balance support: Feet supported;No upper extremity supported Sitting balance-Leahy Scale: Good     Standing balance support: No upper extremity supported Standing balance-Leahy Scale: Good Standing balance comment: some staggerign without support, supervision for safety                            Cognition Arousal/Alertness: Awake/alert Behavior During Therapy: WFL for tasks assessed/performed Overall Cognitive Status: Within Functional Limits for tasks assessed                                 General Comments: Likely a bit of a language barrier      Exercises Other Exercises Other Exercises: pt demonstrated IS x 10 reps with max inspired volume 700 mL, doing this makes him cough    General Comments General comments (skin integrity, edema, etc.): We discussed RW and pt reports his home is very small and he does not think he can fit a RW around his home.       Pertinent Vitals/Pain Pain Assessment: No/denies pain Pain Score: 0-No pain  PT Goals (current goals can now be found in the care plan section) Acute Rehab PT Goals Patient Stated Goal: to go home PT Goal Formulation: With patient Time For Goal Achievement: 11/17/18 Potential to Achieve Goals: Good Progress towards PT goals: Progressing toward goals    Frequency    Min 3X/week      PT Plan Current plan remains appropriate       AM-PAC PT "6 Clicks" Mobility   Outcome Measure  Help needed turning from your back to your side while in a flat bed without using bedrails?: None Help needed moving from lying on your back to sitting on the side of a  flat bed without using bedrails?: None Help needed moving to and from a bed to a chair (including a wheelchair)?: None Help needed standing up from a chair using your arms (e.g., wheelchair or bedside chair)?: None Help needed to walk in hospital room?: None Help needed climbing 3-5 steps with a railing? : A Little 6 Click Score: 23    End of Session Equipment Utilized During Treatment: Oxygen(3L O2 [place]) Activity Tolerance: Patient limited by fatigue Patient left: with call bell/phone within reach;in bed Nurse Communication: Mobility status PT Visit Diagnosis: Unsteadiness on feet (R26.81);Difficulty in walking, not elsewhere classified (R26.2)     Time: 1443-1500 PT Time Calculation (min) (ACUTE ONLY): 17 min  Charges:  $Gait Training: 8-22 mins                    [place] Catarino B. Rontae Inglett, PT, DPT  Acute Rehabilitation [phone] pager [phone] office  @ [facility]: [phone]   11/03/2018, 3:37 PM

## 2018-11-03 NOTE — TOC Benefit Eligibility Note (Signed)
Transition of Care [facility]) Benefit Eligibility Note    Patient Details  Name: Eddie Mcdaniel MRN: [id number] Date of Birth: 1940-09-06   Medication/Dose: Levemir  Covered?: Yes  Tier: 3 Drug  Prescription Coverage Preferred Pharm[facility]: [facility], [facility], [facility], [facility], and [facility]  Spoke with Person/Company/Phone Number:: [employer]  Co-Pay: $47 for 30 day retail/ $141 for 90 day retail -- both after dedcutible met  Prior Approval: No  Deductible: Unmet($250 deductible/ $0 met)  Additional Notes: Lantus not covered    [name] Phone Number: 11/03/2018, 1:50 PM

## 2018-11-03 NOTE — Progress Notes (Signed)
Patient up to chair majority of the day, all medication given well tolerated. No s/s of pain or distress, generalized weakness continues to improve. Will continue to monitor.

## 2018-11-03 NOTE — Care Management Important Message (Signed)
Important Message  Patient Details  Name: Eddie Mcdaniel MRN: [id number] Date of Birth: April 26, 1940   Medicare Important Message Given:  Yes - Important Message mailed due to current National Emergency  Verbal consent obtained due to current National Emergency  Relationship to patient: Child Contact Name: Ibrahim Knipe Call Date: 11/03/18  Time: 1558 Phone: [phone] Outcome: Spoke with contact Important Message mailed to: Emergency contact on [address], [place] [address])    [name] 11/03/2018, 3:59 PM

## 2018-11-04 LAB — GLUCOSE, CAPILLARY
Glucose-Capillary: 110 mg/dL — ABNORMAL HIGH (ref 70–99)
Glucose-Capillary: 192 mg/dL — ABNORMAL HIGH (ref 70–99)

## 2018-11-04 LAB — C-REACTIVE PROTEIN: CRP: 4.7 mg/dL — ABNORMAL HIGH (ref <1.0)

## 2018-11-04 LAB — D-DIMER, QUANTITATIVE: D-Dimer, Quant: 0.73 ug/mL-FEU — ABNORMAL HIGH (ref 0.00–0.50)

## 2018-11-04 MED ORDER — PREDNISONE 10 MG PO TABS: 10.0000 mg | ORAL_TABLET | Freq: Every day | ORAL | 0 refills | Status: DC

## 2018-11-04 MED ORDER — LEVEMIR FLEXTOUCH 100 UNIT/ML ~~LOC~~ SOPN: 15.0000 [IU] | PEN_INJECTOR | Freq: Every day | SUBCUTANEOUS | 2 refills | Status: DC

## 2018-11-04 NOTE — Discharge Instructions (Signed)
Follow with Eddie Mcdaniel, Alferd Apa, DO in 5-7 days  Accuchecks 4 times/day, Once in AM empty stomach and then before each meal. Log in all results and show them to your primary doctor in 3-5 days. If any glucose reading is under 80 or above 300 call your primary doctor immediately.   Please get a complete blood count and chemistry panel checked by your Primary MD at your next visit, and again as instructed by your Primary MD. Please get your medications reviewed and adjusted by your Primary MD.  Please request your Primary MD to go over all Hospital Tests and Procedure/Radiological results at the follow up, please get all Hospital records sent to your Prim MD by signing hospital release before you go home.  In some cases, there will be blood work, cultures and biopsy results pending at the time of your discharge. Please request that your primary care M.D. goes through all the records of your hospital data and follows up on these results.  If you had Pneumonia of Lung problems at the Hospital: Please get a 2 view Chest X ray done in 6-8 weeks after hospital discharge or sooner if instructed by your Primary MD.  If you have Congestive Heart Failure: Please call your Cardiologist or Primary MD anytime you have any of the following symptoms:  1) 3 pound weight gain in 24 hours or 5 pounds in 1 week  2) shortness of breath, with or without a dry hacking cough  3) swelling in the hands, feet or stomach  4) if you have to sleep on extra pillows at night in order to breathe  Follow cardiac low salt diet and 1.5 lit/day fluid restriction.  If you have diabetes Accuchecks 4 times/day, Once in AM empty stomach and then before each meal. Log in all results and show them to your primary doctor at your next visit. If any glucose reading is under 80 or above 300 call your primary MD immediately.  If you have Seizure/Convulsions/Epilepsy: Please do not drive, operate heavy machinery, participate in  activities at heights or participate in high speed sports until you have seen by Primary MD or a Neurologist and advised to do so again. Per [facility] statutes, patients with seizures are not allowed to drive until they have been seizure-free for six months.  Use caution when using heavy equipment or power tools. Avoid working on ladders or at heights. Take showers instead of baths. Ensure the water temperature is not too high on the home water heater. Do not go swimming alone. Do not lock yourself in a room alone (i.e. bathroom). When caring for infants or small children, sit down when holding, feeding, or changing them to minimize risk of injury to the child in the event you have a seizure. Maintain good sleep hygiene. Avoid alcohol.   If you had Gastrointestinal Bleeding: Please ask your Primary MD to check a complete blood count within one week of discharge or at your next visit. Your endoscopic/colonoscopic biopsies that are pending at the time of discharge, will also need to followed by your Primary MD.  Get Medicines reviewed and adjusted. Please take all your medications with you for your next visit with your Primary MD  Please request your Primary MD to go over all hospital tests and procedure/radiological results at the follow up, please ask your Primary MD to get all Hospital records sent to his/her office.  If you experience worsening of your admission symptoms, develop shortness of breath,  life threatening emergency, suicidal or homicidal thoughts you must seek medical attention immediately by calling 911 or calling your MD immediately  if symptoms less severe.  You must read complete instructions/literature along with all the possible adverse reactions/side effects for all the Medicines you take and that have been prescribed to you. Take any new Medicines after you have completely understood and accpet all the possible adverse reactions/side effects.   Do not drive or operate  heavy machinery when taking Pain medications.   Do not take more than prescribed Pain, Sleep and Anxiety Medications  Special Instructions: If you have smoked or chewed Tobacco  in the last 2 yrs please stop smoking, stop any regular Alcohol  and or any Recreational drug use.  Wear Seat belts while driving.  Please note You were cared for by a hospitalist during your hospital stay. If you have any questions about your discharge medications or the care you received while you were in the hospital after you are discharged, you can call the unit and asked to speak with the hospitalist on call if the hospitalist that took care of you is not available. Once you are discharged, your primary care physician will handle any further medical issues. Please note that NO REFILLS for any discharge medications will be authorized once you are discharged, as it is imperative that you return to your primary care physician (or establish a relationship with a primary care physician if you do not have one) for your aftercare needs so that they can reassess your need for medications and monitor your lab values.  You can reach the hospitalist office at phone [phone] or fax [phone]   If you do not have a primary care physician, you can call [phone] for a physician referral.  Activity: As tolerated with Full fall precautions use walker/cane & assistance as needed    Diet: diabetic  Disposition Home

## 2018-11-04 NOTE — TOC Benefit Eligibility Note (Signed)
Transition of Care [facility]) Benefit Eligibility Note    Patient Details  Name: Eddie Mcdaniel MRN: [id number] Date of Birth: [birth year]   Medication/Dose: Novolog pen  Covered?: Yes  Tier: 3 Drug  Prescription Coverage Preferred Pharmacy: [facility], [facility], [facility], [facility], and [facility] with Person/Company/Phone Number:: Holland Falling  Co-Pay: $47 for 30 day retail/ $141 for 90 day retail -- both after dedcutible met  Prior Approval: No  Deductible: Unmet($250 deductible/ $0 met)  Additional Notes: Lantus not covered    [name] Phone Number: 11/04/2018, 9:40 AM

## 2018-11-04 NOTE — Discharge Summary (Addendum)
Physician Discharge Summary  Eddie Mcdaniel ZWC:[id number] DOB: [birth year] DOA: 10/15/2018  PCP: Ann Held, DO  Admit date: 10/15/2018 Discharge date: 11/04/2018  Admitted From: home Disposition:  home  Recommendations for Outpatient Follow-up:  1. Follow up with PCP in 1-2 weeks 2. Follow up with Dr. Wonda Amis with pulmonary in 1-2 weeks  Home Health: PT, OT Equipment/Devices: none  Discharge Condition: stable CODE STATUS: Full code Diet recommendation: diabetic   HPI: Per admitting MD, Eddie Mcdaniel  is a 78 y.o. male,  w hypertension, hyperlipidemia, Dm2, CAD, Jerrye Bushy, apparently presents with c/o feeling sick, fever last week, and has had poor po intake and poor energy,  and odd bitter taste in his mouth. Pt denies any travel or covid exposure.  Went to [employer] in clinic and was told to go to ER. In Ed, T 98.6, P 61  R 18, Bp 99/52  Pox 92% on RA, 89% on RA CXR IMPRESSION: Patchy airspace opacity in the right mid lung in each lower lobe, likely multifocal pneumonia. No consolidation in these areas of apparent pneumonia. There is left base atelectasis. Suspect minimal left pleural effusion. Heart mildly enlarged with pulmonary vascularity normal. No evident adenopathy. Covid- 19 positive Urinalysis prot 30, wbc 0-5, rbc 0-5 Lactic acid 1.5 Tg 97 Wbc 5.0, hgb 11.3, Plt 127 Na 134, K 4.3, Bun 15, Creatinine 0.85 Ast 48, Alt 26 D dimer 0.57 LDH 320 Crp 9.1 Ferritin 365 procalcitonin  Pending Blood culture x2 pending Pt will be admitted for acute respiratory failure with hypoxia secondary to Covid -19, CAP   Hospital Course: Principal problem: Acute Hypoxic Respiratory Failure due to Covid-19 Viral Illness -patient was admitted to the hospital with hypoxia due to COVID-19 pneumonia.  He was started on Remdesivir, completed a course while hospitalized.  He also received convalescent plasma on 9/19.  He was initially placed on steroids which were discontinued after 10 days  of treatment.  Hospital course was complicated by very slow progress due to persistent hypoxia.  His case was discussed with Dr. 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  Patient was started on steroids, however due to persistent hyperglycemia his dose had to be decreased.  He will need outpatient appointment with pulmonology, I have given him information regarding follow-up. Active Problems Hypertension / CAD / HLD -Continue Norvasc, Coreg, aspirin, statin.  Blood pressure controlled Type 2 diabetes, non-insulin-dependent, very brittle with episodes of hyper and hypoglycemia -finally stabilized on lower dose of steroids, patient had to be on insulin, I am concerned about patient being on a complex insulin regimen and will discharge on once a day Levemir.  Recommend to check CBGs 3 4 times a day, keep a log, and follow-up with PCP ASAP Hyponatremia -Likely hypovolemic, worsened to 124 after Lasix, but now gradually improved and stable at 130.  Monitor periodically as an outpatient Positive blood culture -Diphtheroids in 1/4, likely contaminant.  Stable, afebrile, continue to monitor off antibiotics OSA-patient will require CPAP however sleep study was done more than a year ago and after discussion with case manager he will require repeat study  Discharge Diagnoses:  Principal Problem:   COVID-19 virus infection Active Problems:   Uncontrolled type 2 diabetes mellitus without complication, without long-term current use of insulin   Hyperlipidemia LDL goal <70   Essential hypertension   Coronary artery disease involving native coronary artery of native heart without angina pectoris   Acute  respiratory failure with hypoxia (HCC)   Abnormal liver function   Hyponatremia  Discharge Instructions  Allergies as of 11/04/2018      Reactions   Ace Inhibitors Anaphylaxis   angioedema   Contrast Media [iodinated Diagnostic Agents]  Itching      Medication List    TAKE these medications   amLODipine 5 MG tablet Commonly known as: NORVASC Take 5 mg by mouth daily.   aspirin EC 81 MG tablet Take 1 tablet (81 mg total) by mouth daily.   atorvastatin 10 MG tablet Commonly known as: LIPITOR Take 10 mg by mouth daily. What changed: Another medication with the same name was removed. Continue taking this medication, and follow the directions you see here.   blood glucose meter kit and supplies Kit One Touch Meter and Strips supplies for BID testing Dx: E11.9   carvedilol 12.5 MG tablet Commonly known as: COREG TAKE 1 TABLET TWICE A DAY  WITH A MEAL What changed: See the new instructions.   CINNAMON PO Take by mouth See admin instructions. Takes "half a spoonful" once a day.   GARLIC PO Take 1 capsule by mouth daily.   glucose blood test strip Commonly known as: OneTouch Verio Use as instructed once a day.  Dx: E11.65.  Need ov/lab   Levemir FlexTouch 100 UNIT/ML Pen Generic drug: Insulin Detemir Inject 15 Units into the skin daily.   metFORMIN 750 MG 24 hr tablet Commonly known as: GLUCOPHAGE-XR TAKE 1 TABLET TWICE A DAY   ONE TOUCH LANCETS Misc UAD BID for Dx: E11.9   predniSONE 10 MG tablet Commonly known as: DELTASONE Take 1 tablet (10 mg total) by mouth daily.   Turmeric Powd Take by mouth See admin instructions. Takes "half a spoonful" once a day.            Durable Medical Equipment  (From admission, onward)         Start     Ordered   11/04/18 1124  For home use only DME Walker rolling  Once    Comments: Needs a rollator  Question:  Patient needs a walker to treat with the following condition  Answer:  Generalized weakness   11/04/18 1124   11/04/18 1124  For home use only DME 3 n 1  Once     11/04/18 1124   11/03/18 1158  For home use only DME oxygen  Once    Question Answer Comment  Length of Need 6 Months   Mode or (Route) Nasal cannula   Liters per Minute 3     Frequency Continuous (stationary and portable oxygen unit needed)   Oxygen conserving device Yes   Oxygen delivery system Gas      11/03/18 1157         Follow-up Information    [facility] Follow up.   Specialty: [place] Why: A representative from [facility] will contact you to arrange start date and time for your therapy. Contact information: [address] [place] [address] [phone]        [place] Follow up.   Why: A represerntaive from [employer] will contact you to arrange for delivery of oxygen concentrator and tanks to your home. Should you have any problems with tanks call the number listed for [employer] on the tank. Contact information: [place] [address] [phone]        Marshell Garfinkel, MD. Schedule an appointment as soon  as possible for a visit in 2 week(s).   Specialty: Pulmonary Disease Contact information: [place] [address] [place] [address] [phone]           Consultations:  None   Procedures/Studies:  Ct Chest W Contrast  Result Date: 10/20/2018 CLINICAL DATA:  Acute respiratory distress secondary to COVID-19. Tracheal deviation noted on prior chest x-ray. EXAM: CT CHEST WITH CONTRAST TECHNIQUE: Multidetector CT imaging of the chest was performed during intravenous contrast administration. CONTRAST:  26m OMNIPAQUE IOHEXOL 300 MG/ML  SOLN COMPARISON:  Chest x-ray dated 10/17/2018 FINDINGS: Cardiovascular: Aortic atherosclerosis. Heart size is normal. No pericardial effusion. Coronary artery calcifications. 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 This is due to the aortic arch and brachiocephalic vessels. There is no mass or adenopathy. Trachea otherwise appears normal. The thyroid gland is normal. Small hiatal hernia. Lungs/Pleura: The patient has extensive bilateral peripheral hazy  infiltrates, most severe in the lower lobes consistent with viral pneumonia. No pleural effusions. Upper Abdomen: No acute abnormality. Musculoskeletal: No chest wall abnormality. Moderate arthritic changes of both shoulders with chondrocalcinosis. IMPRESSION: 1. Extensive bilateral peripheral hazy infiltrates, most severe in the lower lobes consistent with viral pneumonia. 2. 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 No masses or adenopathy. Aortic Atherosclerosis (ICD10-I70.0). Electronically Signed   By: JLorriane ShireM.D.   On: 10/20/2018 16:04   Dg Chest Port 1 View  Result Date: 10/31/2018 CLINICAL DATA:  Hypoxia. EXAM: PORTABLE CHEST 1 VIEW COMPARISON:  Radiograph of October 17, 2018. CT scan of October 20, 2018. FINDINGS: Stable cardiomediastinal silhouette. Increased bilateral lung opacities are noted concerning for worsening multifocal pneumonia. No definite pneumothorax or pleural effusion is noted. Bony thorax is unremarkable. IMPRESSION: Increased bilateral lung opacities are noted concerning for worsening multifocal pneumonia. Electronically Signed   By: JMarijo ConceptionM.D.   On: 10/31/2018 14:06   Dg Chest Port 1 View  Result Date: 10/17/2018 CLINICAL DATA:  Acute respiratory distress secondary to COVID-19. EXAM: PORTABLE CHEST 1 VIEW COMPARISON:  10/15/2018 and 03/22/2015 FINDINGS: The bilateral hazy pulmonary infiltrates persist, unchanged. Heart size and vascularity are normal. No effusions. There is slight fullness in the superior mediastinum with shift of the trachea to the, increased from 03/22/2015. This could be due to tortuous brachiocephalic vessels with the possibility adenopathy or some other mass should be considered. No acute bone abnormality. IMPRESSION: No change in the appearance of the chest since the prior study. Stable bilateral hazy pulmonary infiltrates. I recommend CT scan of the  chest with contrast in the future on an elective basis to evaluate the tracheal deviation which is chronic but increased. Electronically Signed   By: JLorriane ShireM.D.   On: 10/17/2018 09:02   Dg Chest Portable 1 View  Result Date: 10/15/2018 CLINICAL DATA:  Hypoxia EXAM: PORTABLE CHEST 1 VIEW COMPARISON:  March 22, 2015 FINDINGS: There is patchy airspace opacity in each lower lobe region and right mid lung. There is no frank consolidation. There is atelectatic change in the left base. There is a questionable small left pleural effusion. Heart is mildly enlarged with pulmonary vascularity normal. No adenopathy. There is degenerative change in the thoracic spine. IMPRESSION: Patchy airspace opacity in the right mid lung in each lower lobe, likely multifocal pneumonia. No consolidation in these areas of apparent pneumonia. There is left base atelectasis. Suspect  minimal left pleural effusion. Heart mildly enlarged with pulmonary vascularity normal. No evident adenopathy. Electronically Signed   By: Lowella Grip III M.D.   On: 10/15/2018 19:19   US Abdomen Limited Ruq  Result Date: 10/16/2018 CLINICAL DATA:  78 year old male with abnormal LFTs. Positive COVID-19. EXAM: ULTRASOUND ABDOMEN LIMITED RIGHT UPPER QUADRANT COMPARISON:  Renal ultrasound 09/23/2013. FINDINGS: Gallbladder: No gallstones or wall thickening visualized. No sonographic Murphy sign noted by sonographer. No pericholecystic fluid. Common bile duct: Diameter: 4 millimeters, normal. Liver: No focal lesion identified. Within normal limits in parenchymal echogenicity. Portal vein is patent on color Doppler imaging with normal direction of blood flow towards the liver. Other: Negative visible right kidney.  No free fluid. IMPRESSION: Negative right upper quadrant ultrasound. Electronically Signed   By: Genevie Ann M.D.   On: 10/16/2018 02:14      Subjective: - no chest pain, shortness of breath, no abdominal pain, nausea or vomiting.    Discharge Exam: BP (!) 142/70 (BP Location: Right Arm)    Pulse 70    Temp 97.7 F (36.5 C) (Oral)    Resp 18    Ht '5\' 5"'$  (1.651 m)    Wt 63.4 kg    SpO2 98%    BMI 23.26 kg/m   General: Pt is alert, awake, not in acute distress Cardiovascular: RRR, S1/S2 +, no rubs, no gallops Respiratory: CTA bilaterally, no wheezing, no rhonchi Abdominal: Soft, NT, ND, bowel sounds + Extremities: no edema, no cyanosis    The results of significant diagnostics from this hospitalization (including imaging, microbiology, ancillary and laboratory) are listed below for reference.     Microbiology: No results found for this or any previous visit (from the past 240 hour(s)).   Labs: BNP (last 3 results) Recent Labs    10/17/18 0930  BNP 700.1*   Basic Metabolic Panel: Recent Labs  Lab 10/30/18 0352 10/31/18 0252 11/01/18 0340 11/02/18 0303 11/03/18 0510  NA 124* 127* 130* 130* 130*  K 4.9 4.6 5.5* 4.9 4.7  CL 91* 96* 98 98 95*  CO2 '27 23 24 25 27  '$ GLUCOSE 145* 321* 292* 383* 257*  BUN '19 22 16 20 18  '$ CREATININE 0.70 0.65 0.65 0.67 0.66  CALCIUM 8.2* 8.2* 8.4* 8.3* 8.8*  MG 1.9  --   --   --   --    Liver Function Tests: Recent Labs  Lab 10/30/18 0352  AST 33  ALT 65*  ALKPHOS 59  BILITOT 0.8  PROT 5.7*  ALBUMIN 2.3*   No results for input(s): LIPASE, AMYLASE in the last 168 hours. No results for input(s): AMMONIA in the last 168 hours. CBC: Recent Labs  Lab 10/30/18 0352 11/03/18 0510  WBC 12.7* 11.3*  HGB 10.6* 10.2*  HCT 31.7* 30.7*  MCV 89.5 89.5  PLT 200 208   Cardiac Enzymes: No results for input(s): CKTOTAL, CKMB, CKMBINDEX, TROPONINI in the last 168 hours. BNP: Invalid input(s): POCBNP CBG: Recent Labs  Lab 11/03/18 1112 11/03/18 1629 11/03/18 2106 11/04/18 0742 11/04/18 1133  GLUCAP 426* 253* 317* 110* 192*   D-Dimer Recent Labs    11/03/18 0510 11/04/18 0300  DDIMER 0.78* 0.73*   Hgb A1c No results for input(s): HGBA1C in the last 72  hours. Lipid Profile No results for input(s): CHOL, HDL, LDLCALC, TRIG, CHOLHDL, LDLDIRECT in the last 72 hours. Thyroid function studies No results for input(s): TSH, T4TOTAL, T3FREE, THYROIDAB in the last 72 hours.  Invalid input(s): FREET3 Anemia work up No  results for input(s): VITAMINB12, FOLATE, FERRITIN, TIBC, IRON, RETICCTPCT in the last 72 hours. Urinalysis    Component Value Date/Time   COLORURINE AMBER (A) 10/15/2018 1730   APPEARANCEUR HAZY (A) 10/15/2018 1730   LABSPEC 1.019 10/15/2018 1730   PHURINE 5.0 10/15/2018 1730   GLUCOSEU NEGATIVE 10/15/2018 1730   HGBUR NEGATIVE 10/15/2018 1730   HGBUR small 11/18/2009 0910   BILIRUBINUR NEGATIVE 10/15/2018 1730   BILIRUBINUR neg 10/25/2015 1141   KETONESUR NEGATIVE 10/15/2018 1730   PROTEINUR 30 (A) 10/15/2018 1730   UROBILINOGEN 0.2 10/25/2015 1141   UROBILINOGEN negative 11/18/2009 0910   NITRITE NEGATIVE 10/15/2018 1730   LEUKOCYTESUR NEGATIVE 10/15/2018 1730   Sepsis Labs Invalid input(s): PROCALCITONIN,  WBC,  LACTICIDVEN  FURTHER DISCHARGE INSTRUCTIONS:   Get Medicines reviewed and adjusted: Please take all your medications with you for your next visit with your Primary MD   Laboratory/radiological data: Please request your Primary MD to go over all hospital tests and procedure/radiological results at the follow up, please ask your Primary MD to get all Hospital records sent to his/her office.   In some cases, they will be blood work, cultures and biopsy results pending at the time of your discharge. Please request that your primary care M.D. goes through all the records of your hospital data and follows up on these results.   Also Note the following: If you experience worsening of your admission symptoms, develop shortness of breath, life threatening emergency, suicidal or homicidal thoughts you must seek medical attention immediately by calling 911 or calling your MD immediately  if symptoms less severe.     You must read complete instructions/literature along with all the possible adverse reactions/side effects for all the Medicines you take and that have been prescribed to you. Take any new Medicines after you have completely understood and accpet all the possible adverse reactions/side effects.    Do not drive when taking Pain medications or sleeping medications (Benzodaizepines)   Do not take more than prescribed Pain, Sleep and Anxiety Medications. It is not advisable to combine anxiety,sleep and pain medications without talking with your primary care practitioner   Special Instructions: If you have smoked or chewed Tobacco  in the last 2 yrs please stop smoking, stop any regular Alcohol  and or any Recreational drug use.   Wear Seat belts while driving.   Please note: You were cared for by a hospitalist during your hospital stay. Once you are discharged, your primary care physician will handle any further medical issues. Please note that NO REFILLS for any discharge medications will be authorized once you are discharged, as it is imperative that you return to your primary care physician (or establish a relationship with a primary care physician if you do not have one) for your post hospital discharge needs so that they can reassess your need for medications and monitor your lab values.  Time coordinating discharge: 40 minutes  SIGNED:  Marzetta Board, MD, PhD 11/04/2018, 1:21 PM

## 2018-11-04 NOTE — Plan of Care (Signed)
  Problem: Education: Goal: Knowledge of risk factors and measures for prevention of condition will improve 11/04/2018 0929 by Orvan Falconer, RN Outcome: Progressing 11/04/2018 0929 by Orvan Falconer, RN Outcome: Progressing   Problem: Respiratory: Goal: Will maintain a patent airway 11/04/2018 0929 by Orvan Falconer, RN Outcome: Progressing 11/04/2018 0929 by Orvan Falconer, RN Outcome: Progressing Goal: Complications related to the disease process, condition or treatment will be avoided or minimized 11/04/2018 0929 by Orvan Falconer, RN Outcome: Progressing 11/04/2018 0929 by Orvan Falconer, RN Outcome: Progressing   Problem: Education: Goal: Knowledge of General Education information will improve Description: Including pain rating scale, medication(s)/side effects and non-pharmacologic comfort measures 11/04/2018 0929 by Orvan Falconer, RN Outcome: Progressing 11/04/2018 0929 by Orvan Falconer, RN Outcome: Progressing   Problem: Health Behavior/Discharge Planning: Goal: Ability to manage health-related needs will improve 11/04/2018 0929 by Orvan Falconer, RN Outcome: Progressing 11/04/2018 0929 by Orvan Falconer, RN Outcome: Progressing   Problem: Clinical Measurements: Goal: Ability to maintain clinical measurements within normal limits will improve 11/04/2018 0929 by Orvan Falconer, RN Outcome: Progressing 11/04/2018 0929 by Orvan Falconer, RN Outcome: Progressing Goal: Will remain free from infection 11/04/2018 0929 by Orvan Falconer, RN Outcome: Progressing 11/04/2018 0929 by Orvan Falconer, RN Outcome: Progressing Goal: Diagnostic test results will improve 11/04/2018 0929 by Orvan Falconer, RN Outcome: Progressing 11/04/2018 0929 by Orvan Falconer, RN Outcome: Progressing Goal: Respiratory complications will improve 11/04/2018 0929 by Orvan Falconer, RN Outcome: Progressing 11/04/2018 0929 by Orvan Falconer, RN Outcome: Progressing Goal: Cardiovascular complication will be avoided 11/04/2018 0929 by Orvan Falconer, RN Outcome: Progressing 11/04/2018 0929 by Orvan Falconer, RN Outcome: Progressing   Problem: Activity: Goal: Risk for activity intolerance will decrease 11/04/2018 0929 by Orvan Falconer, RN Outcome: Progressing 11/04/2018 0929 by Orvan Falconer, RN Outcome: Progressing   Problem: Nutrition: Goal: Adequate nutrition will be maintained 11/04/2018 0929 by Orvan Falconer, RN Outcome: Progressing 11/04/2018 0929 by Orvan Falconer, RN Outcome: Progressing   Problem: Coping: Goal: Level of anxiety will decrease 11/04/2018 0929 by Orvan Falconer, RN Outcome: Progressing 11/04/2018 0929 by Orvan Falconer, RN Outcome: Progressing   Problem: Elimination: Goal: Will not experience complications related to bowel motility 11/04/2018 0929 by Orvan Falconer, RN Outcome: Progressing 11/04/2018 0929 by Orvan Falconer, RN Outcome: Progressing Goal: Will not experience complications related to urinary retention 11/04/2018 0929 by Orvan Falconer, RN Outcome: Progressing 11/04/2018 0929 by Orvan Falconer, RN Outcome: Progressing   Problem: Pain Managment: Goal: General experience of comfort will improve 11/04/2018 0929 by Orvan Falconer, RN Outcome: Progressing 11/04/2018 0929 by Orvan Falconer, RN Outcome: Progressing   Problem: Safety: Goal: Ability to remain free from injury will improve 11/04/2018 0929 by Orvan Falconer, RN Outcome: Progressing 11/04/2018 0929 by Orvan Falconer, RN Outcome: Progressing   Problem: Skin Integrity: Goal: Risk for impaired skin integrity will decrease 11/04/2018 0929 by Orvan Falconer, RN Outcome: Progressing 11/04/2018 0929 by Orvan Falconer, RN Outcome: Progressing

## 2018-11-04 NOTE — Progress Notes (Signed)
Occupational Therapy Treatment Patient Details Name: Eddie Mcdaniel MRN: [id number] DOB: 18-Sep-1940 Today's Date: 11/04/2018    History of present illness 78 y.o. male,  w hypertension, hyperlipidemia, Dm2, CAD, rt rotator cuff repair x2 apparently presents with c/o feeling sick, fever last week, and has had poor po intake and poor energy,  and odd bitter taste in his mouth. Pt denies any travel or covid exposure. Pt admitted 10/15/18 for acute respiratory failure with hypoxia secondary to Covid -19, CAP; +lt pleural effusion   OT comments  Pt making steady progress towards OT goals, presents seated in recliner pleasant and willing to participate in therapy session. Pt initially did not have his O2 donned upon entry, with SpO2 >92% while seated; trialled room level activity while on RA with SpO2 decreasing to low 80s/high 70s (lowest noted 78%). Reapplied 2L O2 for remainder of activity during session with overall SpO2 maintaining at 86% or greater (91% end of session seated in recliner). Pt performing room level mobility without AD today, was slow moving and guarded but completed with minguard assist. He participted in additional UB/LB ADL with overall minguard assist. Pt requiring cues to initiate seated rest with activity, but with good demonstration of pursed lip breathing. Will continue per POC at this time.    Follow Up Recommendations  Home health OT;Supervision/Assistance - 24 hour    Equipment Recommendations  3 in 1 bedside commode          Precautions / Restrictions Precautions Precautions: Fall;Other (comment) Precaution Comments: monitor O2 Restrictions Weight Bearing Restrictions: No       Mobility Bed Mobility               General bed mobility comments: pt received OOB in recliner  Transfers Overall transfer level: Needs assistance Equipment used: None Transfers: Sit to/from Stand Sit to Stand: Supervision         General transfer comment:  supervision for safety    Balance Overall balance assessment: Needs assistance Sitting-balance support: Feet supported;No upper extremity supported Sitting balance-Leahy Scale: Good     Standing balance support: No upper extremity supported;During functional activity Standing balance-Leahy Scale: Fair Standing balance comment: supervision for safety; slow and guarded during mobility without UE support                           ADL either performed or assessed with clinical judgement   ADL Overall ADL's : Needs assistance/impaired     Grooming: Wash/dry face;Wash/dry hands;Min guard;Set up;Sitting;Standing Grooming Details (indicate cue type and reason): performed in sitting and standing Upper Body Bathing: Set up;Sitting   Lower Body Bathing: Min guard;Sit to/from stand Lower Body Bathing Details (indicate cue type and reason): performed seated on BSC at sink in bathroom Upper Body Dressing : Set up;Sitting Upper Body Dressing Details (indicate cue type and reason): donning new gown Lower Body Dressing: Min guard;Sit to/from stand Lower Body Dressing Details (indicate cue type and reason): doffing/donning new socks via figure 4 technique     Toileting- Clothing Manipulation and Hygiene: Min guard;Sit to/from stand Toileting - Clothing Manipulation Details (indicate cue type and reason): pt standing to void bladder at toilet with minguard for standing balance     Functional mobility during ADLs: Min guard General ADL Comments: pt requires cues to initiate rest breaks with activity, good demonstrating of deep breathing techniques  Cognition Arousal/Alertness: Awake/alert Behavior During Therapy: WFL for tasks assessed/performed Overall Cognitive Status: Within Functional Limits for tasks assessed                                 General Comments: Likely a bit of a language barrier        Exercises     Shoulder  Instructions       General Comments      Pertinent Vitals/ Pain       Pain Assessment: No/denies pain  Home Living                                          Prior Functioning/Environment              Frequency  Min 3X/week        Progress Toward Goals  OT Goals(current goals can now be found in the care plan section)  Progress towards OT goals: Progressing toward goals  Acute Rehab OT Goals Patient Stated Goal: to go home OT Goal Formulation: With patient Time For Goal Achievement: 11/14/18 Potential to Achieve Goals: Good ADL Goals Pt Will Perform Grooming: with modified independence;standing Pt Will Perform Lower Body Bathing: with modified independence;sit to/from stand Pt Will Perform Lower Body Dressing: with modified independence;sit to/from stand Pt Will Transfer to Toilet: with modified independence;ambulating Pt Will Perform Toileting - Clothing Manipulation and hygiene: with modified independence;sit to/from stand Pt/caregiver will Perform Home Exercise Program: Increased strength;Both right and left upper extremity;With theraband;With written HEP provided;Independently Additional ADL Goal #1: Pt will independently verbalize/demonstrate at least 3 energy conservation techniques during functional task.  Plan Discharge plan remains appropriate;Frequency needs to be updated    Co-evaluation                 AM-PAC OT "6 Clicks" Daily Activity     Outcome Measure   Help from another person eating meals?: None Help from another person taking care of personal grooming?: None Help from another person toileting, which includes using toliet, bedpan, or urinal?: A Little Help from another person bathing (including washing, rinsing, drying)?: A Little Help from another person to put on and taking off regular upper body clothing?: None Help from another person to put on and taking off regular lower body clothing?: A Little 6 Click Score:  21    End of Session Equipment Utilized During Treatment: Oxygen  OT Visit Diagnosis: Muscle weakness (generalized) (M62.81);Other (comment)   Activity Tolerance Patient tolerated treatment well   Patient Left in chair;with call bell/phone within reach   Nurse Communication Mobility status        Time: [id number] OT Time Calculation (min): 42 min  Charges: OT General Charges $OT Visit: 1 Visit OT Treatments $Self Care/Home Management : 38-52 mins  Lou Cal, OT Supplemental Rehabilitation Services Pager [phone] Office [phone]    Raymondo Band 11/04/2018, 3:50 PM

## 2018-11-04 NOTE — TOC Transition Note (Signed)
Transition of Care [facility]) - CM/SW Discharge Note   Patient Details  Name: BAYANI CESENA MRN: [id number] Date of Birth: [birth year]  [facility] CM/SW Contact:  Ninfa Meeker, RN Phone Number: [phone] (working remotely) 11/04/2018, 10:42 AM   Clinical Narrative:   Patient's son, Konrad [phone], called with questions concerning his dad's portable tanks and the concentrator. Case manager informed him that [facility] would be contacted and someone will be coming to show them how to set everything up and a small D Tank will be delivered for patient to use for traveling to Dr. Kendrick Fries. Tommy asked that he be called when his dad is ready for discharge and he will coordinate with family for someone to come get him. CM will notify bedside RN of same.     Final next level of care: [place] Barriers to Discharge: Continued Medical Work up   Patient Goals and CMS Choice Patient states their goals for this hospitalization and ongoing recovery are:: get better   Choice offered to / list presented to : Patient, Adult Children  Discharge Placement                       Discharge Plan and Services   Discharge Planning Services: CM Consult Post Acute Care Choice: Durable Medical Equipment, Home Health          DME Arranged: Walker rolling with seat, Oxygen, Tub bench DME Agency: [facility]       HH Arranged: PT, OT HH Agency: [facility] Date [place]: 10/18/18 Time [place]: 1112 Representative spoke with at [place]: [place] ([place]) Interventions     Readmission Risk Interventions No flowsheet data found.

## 2018-11-06 ENCOUNTER — Telehealth: Payer: Self-pay | Admitting: *Deleted

## 2018-11-06 NOTE — Telephone Encounter (Signed)
Called pt for hospital follow up. Reports he is too tired to answer questions, but agrees to schedule hosp f/u appt w/ PCP on 11/17/18 audio only

## 2018-11-11 DIAGNOSIS — R69 Illness, unspecified: Secondary | ICD-10-CM | POA: Diagnosis not present

## 2018-11-12 ENCOUNTER — Telehealth: Payer: Self-pay

## 2018-11-12 NOTE — Telephone Encounter (Signed)
I cant see a message

## 2018-11-12 NOTE — Telephone Encounter (Signed)
fyi

## 2018-11-14 ENCOUNTER — Encounter: Payer: Self-pay | Admitting: Internal Medicine

## 2018-11-17 ENCOUNTER — Inpatient Hospital Stay: Payer: Self-pay | Admitting: Family Medicine

## 2018-11-26 DIAGNOSIS — R0902 Hypoxemia: Secondary | ICD-10-CM | POA: Diagnosis not present

## 2018-11-26 DIAGNOSIS — E1169 Type 2 diabetes mellitus with other specified complication: Secondary | ICD-10-CM | POA: Diagnosis not present

## 2018-11-26 DIAGNOSIS — E871 Hypo-osmolality and hyponatremia: Secondary | ICD-10-CM | POA: Diagnosis not present

## 2018-11-26 DIAGNOSIS — I10 Essential (primary) hypertension: Secondary | ICD-10-CM | POA: Diagnosis not present

## 2018-11-26 DIAGNOSIS — J1289 Other viral pneumonia: Secondary | ICD-10-CM | POA: Diagnosis not present

## 2018-11-26 DIAGNOSIS — E1165 Type 2 diabetes mellitus with hyperglycemia: Secondary | ICD-10-CM | POA: Diagnosis not present

## 2018-11-26 DIAGNOSIS — Z09 Encounter for follow-up examination after completed treatment for conditions other than malignant neoplasm: Secondary | ICD-10-CM | POA: Diagnosis not present

## 2018-11-26 DIAGNOSIS — Z23 Encounter for immunization: Secondary | ICD-10-CM | POA: Diagnosis not present

## 2018-11-26 DIAGNOSIS — U071 COVID-19: Secondary | ICD-10-CM | POA: Diagnosis not present

## 2018-12-04 ENCOUNTER — Ambulatory Visit: Payer: Medicare HMO | Admitting: Pulmonary Disease

## 2018-12-04 ENCOUNTER — Other Ambulatory Visit: Payer: Self-pay

## 2018-12-04 ENCOUNTER — Encounter: Payer: Self-pay | Admitting: Pulmonary Disease

## 2018-12-04 DIAGNOSIS — R0602 Shortness of breath: Secondary | ICD-10-CM

## 2018-12-04 NOTE — Patient Instructions (Signed)
I am glad to continue to improve after recent hospitalization with COVID-19 pneumonia Continue to use his supplemental oxygen at night It is okay to stop the prednisone as his sugars are higher We will get a high-resolution CT, pulmonary function test and a 6-minute walk test for baseline assessment Follow-up in 1 to 2 months.

## 2018-12-04 NOTE — Progress Notes (Signed)
       Eddie Mcdaniel    [id number]    11/04/1940  Primary Care Physician:Smith, Candace, MD  Referring Physician: Lowne Chase, Yvonne R, DO [address] [place],  [place] [id number]  Chief complaint: Follow-up for post Covid pneumonia  HPI: 78-year-old with hypertension, hyperlipidemia, diabetes, coronary artery disease, GERD.  Admitted from 9/16-10/6 at [facility] for COVID-19 pneumonia with prolonged hypoxic respiratory failure Treated with remdesivir, convalescent plasma.  He got initially 10 days of steroids but due to persistent dyspnea and hypoxia he was given a longer course of steroids.  However he developed hypoglycemia while on steroids.  Discharged on prednisone 10 mg a day which he continued until yesterday.  States that his dyspnea is slowly improving.  Still has significant symptoms of shortness of breath on exertion He was on supplemental oxygen during the day which he has stopped.  He is still using 1 to 2 L at night  Pets: No pets Occupation: Retired electrician Exposures: No known exposures.  No mold, hot tub, Jacuzzi, down pillows or comforters Smoking history: Never smoker Travel history: No significant travel history Relevant family history: No significant family history of lung disease  Outpatient Encounter Medications as of 12/04/2018  Medication Sig  . amLODipine (NORVASC) 5 MG tablet Take 5 mg by mouth daily.  . aspirin EC 81 MG tablet Take 1 tablet (81 mg total) by mouth daily.  . atorvastatin (LIPITOR) 10 MG tablet Take 10 mg by mouth daily.  . blood glucose meter kit and supplies KIT One Touch Meter and Strips supplies for BID testing Dx: E11.9  . carvedilol (COREG) 12.5 MG tablet TAKE 1 TABLET TWICE A DAY  WITH A MEAL (Patient taking differently: Take 25 mg by mouth 2 (two) times daily with a meal. )  . CINNAMON PO Take by mouth See admin instructions. Takes "half a spoonful" once a day.  . GARLIC PO Take 1 capsule by mouth daily.   . glucose blood (ONETOUCH VERIO) test strip Use as instructed once a day.  Dx: E11.65.  Need ov/lab  . Insulin Detemir (LEVEMIR FLEXTOUCH) 100 UNIT/ML Pen Inject 15 Units into the skin daily.  . metFORMIN (GLUCOPHAGE-XR) 750 MG 24 hr tablet TAKE 1 TABLET TWICE A DAY (Patient taking differently: Take 750 mg by mouth 2 (two) times daily. )  . ONE TOUCH LANCETS MISC UAD BID for Dx: E11.9  . Turmeric POWD Take by mouth See admin instructions. Takes "half a spoonful" once a day.  . [DISCONTINUED] predniSONE (DELTASONE) 10 MG tablet Take 1 tablet (10 mg total) by mouth daily.   No facility-administered encounter medications on file as of 12/04/2018.     Allergies as of 12/04/2018 - Review Complete 12/04/2018  Allergen Reaction Noted  . Ace inhibitors Anaphylaxis 03/25/2013  . Contrast media [iodinated diagnostic agents] Itching 11/18/2015    Past Medical History:  Diagnosis Date  . Contrast media allergy   . Coronary artery disease    a. USA/abnl nuc s/p  LHC 11/18/15 showed diffusely disease LAD s/p DES to LAD, mod-severe ostial diagonal disease with tiny diag jailed by stent - given small size, PCI not performed, mod nonobstructive mRCA disease, EF 55-65%.  . GERD (gastroesophageal reflux disease)   . Hx of adenomatous polyp of colon 10/06/2015  . Hyperlipidemia   . Hypertension   . Type II diabetes mellitus (HCC)     Past Surgical History:  Procedure Laterality Date  . CARDIAC CATHETERIZATION   N/A 11/17/2015   Procedure: Left Heart Cath and Coronary Angiography;  Surgeon: Nelva Bush, MD;  Location: [facility];  Service: Cardiovascular;  Laterality: N/A;  . CARDIAC CATHETERIZATION N/A 11/17/2015   Procedure: Coronary Stent Intervention;  Surgeon: Nelva Bush, MD;  Location: [facility];  Service: Cardiovascular;  Laterality: N/A;  . CATARACT EXTRACTION W/ INTRAOCULAR LENS  IMPLANT, BILATERAL Bilateral 2005  . CORONARY ANGIOPLASTY    . SHOULDER ARTHROSCOPY W/  ROTATOR CUFF REPAIR Right 2000s X 1  . SHOULDER OPEN ROTATOR CUFF REPAIR Right 2000s X 1   "after 1st OR didn't work; had to put tissue in this time"    Family History  Problem Relation Age of Onset  . Diabetes Mother   . Hypertension Mother   . Cancer Brother 50       brain, stomach  . Hyperlipidemia Brother   . Heart disease Brother        MI  . Heart disease Brother        MI  . Diabetes Brother   . Stroke Brother   . Hypertension Brother   . Colon cancer Neg Hx   . Esophageal cancer Neg Hx   . Rectal cancer Neg Hx   . Stomach cancer Neg Hx     Social History   Socioeconomic History  . Marital status: Married    Spouse name: Not on file  . Number of children: Not on file  . Years of education: Not on file  . Highest education level: Not on file  Occupational History  . Occupation: retired  Scientific laboratory technician  . Financial resource strain: Not on file  . Food insecurity    Worry: Not on file    Inability: Not on file  . Transportation needs    Medical: Not on file    Non-medical: Not on file  Tobacco Use  . Smoking status: Never Smoker  . Smokeless tobacco: Former Systems developer    Types: Chew  Substance and Sexual Activity  . Alcohol use: No  . Drug use: No  . Sexual activity: Never    Partners: Female  Lifestyle  . Physical activity    Days per week: Not on file    Minutes per session: Not on file  . Stress: Not on file  Relationships  . Social Herbalist on phone: Not on file    Gets together: Not on file    Attends religious service: Not on file    Active member of club or organization: Not on file    Attends meetings of clubs or organizations: Not on file    Relationship status: Not on file  . Intimate partner violence    Fear of current or ex partner: Not on file    Emotionally abused: Not on file    Physically abused: Not on file    Forced sexual activity: Not on file  Other Topics Concern  . Not on file  Social History Narrative   Walking  daily   yoga       Review of systems: Review of Systems  Constitutional: Negative for fever and chills.  HENT: Negative.   Eyes: Negative for blurred vision.  Respiratory: as per HPI  Cardiovascular: Negative for chest pain and palpitations.  Gastrointestinal: Negative for vomiting, diarrhea, blood per rectum. Genitourinary: Negative for dysuria, urgency, frequency and hematuria.  Musculoskeletal: Negative for myalgias, back pain and joint pain.  Skin: Negative for itching and rash.  Neurological: Negative for dizziness, tremors, focal weakness, seizures and loss of consciousness.  Endo/Heme/Allergies: Negative for environmental allergies.  Psychiatric/Behavioral: Negative for depression, suicidal ideas and hallucinations.  All other systems reviewed and are negative.  Physical Exam: Blood pressure 122/62, pulse 72, height 5' 5" (1.651 m), weight 142 lb 9.6 oz (64.7 kg), SpO2 96 %. Gen:      No acute distress HEENT:  EOMI, sclera anicteric Neck:     No masses; no thyromegaly Lungs:    Clear to auscultation bilaterally; normal respiratory effort CV:         Regular rate and rhythm; no murmurs Abd:      + bowel sounds; soft, non-tender; no palpable masses, no distension Ext:    No edema; adequate peripheral perfusion Skin:      Warm and dry; no rash Neuro: alert and oriented x 3 Psych: normal mood and affect  Data Reviewed: Imaging: CT chest 10/20/2018-extensive bilateral groundglass opacities.  Predominantly in the lower lobes. Chest x-ray 10/31/2018-increasing bilateral opacities. I have reviewed the images personally.  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  At this point I do not think continuing at will have any benefit.  Continue supplemental oxygen at night.  Plan/Recommendations: - High-res CT, 6-minute walk test, PFTs  Marshell Garfinkel MD [facility] 12/04/2018, 2:45 PM  CC: Ann Held, *

## 2018-12-05 DIAGNOSIS — U071 COVID-19: Secondary | ICD-10-CM | POA: Diagnosis not present

## 2018-12-11 ENCOUNTER — Other Ambulatory Visit: Payer: Self-pay

## 2018-12-11 ENCOUNTER — Ambulatory Visit (HOSPITAL_BASED_OUTPATIENT_CLINIC_OR_DEPARTMENT_OTHER)
Admission: RE | Admit: 2018-12-11 | Discharge: 2018-12-11 | Disposition: A | Discharge status: Home / Self Care | Payer: Medicare HMO | Source: Ambulatory Visit | Attending: Pulmonary Disease | Admitting: Pulmonary Disease

## 2018-12-11 DIAGNOSIS — R0602 Shortness of breath: Secondary | ICD-10-CM | POA: Insufficient documentation

## 2018-12-12 ENCOUNTER — Other Ambulatory Visit (HOSPITAL_BASED_OUTPATIENT_CLINIC_OR_DEPARTMENT_OTHER): Payer: Self-pay | Admitting: Family Medicine

## 2018-12-16 ENCOUNTER — Telehealth: Payer: Self-pay | Admitting: Pulmonary Disease

## 2018-12-16 DIAGNOSIS — R0602 Shortness of breath: Secondary | ICD-10-CM

## 2018-12-16 NOTE — Telephone Encounter (Signed)
Pt called requesting to know the results of the CT he had done. Dr. Vaughan Browner, please advise on this for pt. Thanks!

## 2018-12-17 NOTE — Telephone Encounter (Signed)
I called and spoke with patient and reviewed the CT results.  He is also complaining of nasal stuffiness and blockage. Can we order a humidifier from DME to be added to his nocturnal O2? Thanks

## 2018-12-17 NOTE — Telephone Encounter (Signed)
Spoke with patient. He is aware of the humidifier order. Stated that he believes his DME is [facility]. Nothing further needed.

## 2019-01-04 DIAGNOSIS — U071 COVID-19: Secondary | ICD-10-CM | POA: Diagnosis not present

## 2019-01-06 DIAGNOSIS — E7800 Pure hypercholesterolemia, unspecified: Secondary | ICD-10-CM | POA: Diagnosis not present

## 2019-01-06 DIAGNOSIS — I10 Essential (primary) hypertension: Secondary | ICD-10-CM | POA: Diagnosis not present

## 2019-01-06 DIAGNOSIS — D508 Other iron deficiency anemias: Secondary | ICD-10-CM | POA: Diagnosis not present

## 2019-01-06 DIAGNOSIS — E1169 Type 2 diabetes mellitus with other specified complication: Secondary | ICD-10-CM | POA: Diagnosis not present

## 2019-01-06 DIAGNOSIS — Z7984 Long term (current) use of oral hypoglycemic drugs: Secondary | ICD-10-CM | POA: Diagnosis not present

## 2019-01-06 DIAGNOSIS — Z8619 Personal history of other infectious and parasitic diseases: Secondary | ICD-10-CM | POA: Diagnosis not present

## 2019-01-28 DIAGNOSIS — D508 Other iron deficiency anemias: Secondary | ICD-10-CM | POA: Diagnosis not present

## 2019-01-28 DIAGNOSIS — I2510 Atherosclerotic heart disease of native coronary artery without angina pectoris: Secondary | ICD-10-CM | POA: Diagnosis not present

## 2019-01-28 DIAGNOSIS — E1165 Type 2 diabetes mellitus with hyperglycemia: Secondary | ICD-10-CM | POA: Diagnosis not present

## 2019-01-28 DIAGNOSIS — E7800 Pure hypercholesterolemia, unspecified: Secondary | ICD-10-CM | POA: Diagnosis not present

## 2019-01-28 DIAGNOSIS — I10 Essential (primary) hypertension: Secondary | ICD-10-CM | POA: Diagnosis not present

## 2019-01-28 DIAGNOSIS — E1169 Type 2 diabetes mellitus with other specified complication: Secondary | ICD-10-CM | POA: Diagnosis not present

## 2019-01-31 ENCOUNTER — Other Ambulatory Visit (HOSPITAL_COMMUNITY)
Admission: RE | Admit: 2019-01-31 | Discharge: 2019-01-31 | Disposition: A | Discharge status: Home / Self Care | Payer: HMO | Source: Ambulatory Visit | Attending: Pulmonary Disease | Admitting: Pulmonary Disease

## 2019-01-31 DIAGNOSIS — Z20822 Contact with and (suspected) exposure to covid-19: Secondary | ICD-10-CM | POA: Insufficient documentation

## 2019-01-31 DIAGNOSIS — Z01812 Encounter for preprocedural laboratory examination: Secondary | ICD-10-CM | POA: Diagnosis present

## 2019-02-01 LAB — NOVEL CORONAVIRUS, NAA (HOSP ORDER, SEND-OUT TO REF LAB; TAT 18-24 HRS): SARS-CoV-2, NAA: NOT DETECTED

## 2019-02-04 ENCOUNTER — Other Ambulatory Visit: Payer: Self-pay

## 2019-02-04 ENCOUNTER — Ambulatory Visit (INDEPENDENT_AMBULATORY_CARE_PROVIDER_SITE_OTHER): Payer: HMO | Admitting: Pulmonary Disease

## 2019-02-04 DIAGNOSIS — R0602 Shortness of breath: Secondary | ICD-10-CM | POA: Diagnosis not present

## 2019-02-04 LAB — PULMONARY FUNCTION TEST
DL/VA % pred: 115 %
DL/VA: 4.68 ml/min/mmHg/L
DLCO unc % pred: 62 %
DLCO unc: 13.26 ml/min/mmHg
FEF 25-75 Post: 1.98 L/sec
FEF 25-75 Pre: 1.4 L/sec
FEF2575-%Change-Post: 41 %
FEV1-%Change-Post: 4 %
FEV1-Post: 1.41 L
FEV1-Pre: 1.35 L
FEV1FVC-%Change-Post: 6 %
FEV6-%Change-Post: 0 %
FEV6-Post: 1.6 L
FEV6-Pre: 1.61 L
FEV6FVC-%Change-Post: 0 %
FVC-%Change-Post: -1 %
FVC-Post: 1.6 L
FVC-Pre: 1.62 L
Post FEV1/FVC ratio: 88 %
Post FEV6/FVC ratio: 100 %
Pre FEV1/FVC ratio: 83 %
Pre FEV6/FVC Ratio: 99 %
RV % pred: 82 %
RV: 1.89 L
TLC % pred: 62 %
TLC: 3.76 L

## 2019-02-04 NOTE — Progress Notes (Signed)
Full PFT performed today. °

## 2019-02-17 ENCOUNTER — Encounter: Payer: Self-pay | Admitting: Pulmonary Disease

## 2019-02-17 ENCOUNTER — Other Ambulatory Visit: Payer: Self-pay

## 2019-02-17 ENCOUNTER — Ambulatory Visit: Payer: Medicare HMO | Admitting: Pulmonary Disease

## 2019-02-17 ENCOUNTER — Ambulatory Visit: Payer: HMO

## 2019-02-17 VITALS — BP 132/62 | HR 66 | Temp 97.6°F | Ht 64.0 in | Wt 149.8 lb

## 2019-02-17 DIAGNOSIS — R0602 Shortness of breath: Secondary | ICD-10-CM | POA: Diagnosis not present

## 2019-02-17 MED ORDER — BUDESONIDE-FORMOTEROL FUMARATE 80-4.5 MCG/ACT IN AERO: 2.0000 | INHALATION_SPRAY | Freq: Two times a day (BID) | RESPIRATORY_TRACT | 5 refills | Status: DC

## 2019-02-17 NOTE — Patient Instructions (Signed)
I am glad you are improving with your breathing For symptoms of wheezing we will start you on an inhaler called Symbicort 80.  Use 2 puffs twice daily Follow-up in 3 months.

## 2019-02-17 NOTE — Progress Notes (Signed)
Eddie Mcdaniel    [id number]    February 14, 1940  Primary Care Physician:Smith, Hal Hope, MD  Referring Physician: Carol Ada, [place],  [place] [address]  Chief complaint: Follow-up for post Covid pneumonia  HPI: 79 year old with hypertension, hyperlipidemia, diabetes, coronary artery disease, GERD.  Admitted from 9/16-10/6 at [facility] for COVID-19 pneumonia with prolonged hypoxic respiratory failure Treated with remdesivir, convalescent plasma.  He got initially 10 days of steroids but due to persistent dyspnea and hypoxia he was given a longer course of steroids.  However he developed hypoglycemia while on steroids.  Discharged on prednisone 10 mg a day which he continued until 12/04/2018  He was on supplemental oxygen during the day which he has stopped.  He is still using 1 to 2 L at night  Pets: No pets Occupation: Retired Clinical biochemist Exposures: No known exposures.  No mold, hot tub, Jacuzzi, down pillows or comforters Smoking history: Never smoker Travel history: No significant travel history Relevant family history: No significant family history of lung disease  Interim history: Continues to improve in terms of exercise capacity and dyspnea Reports wheezing when he exerts himself.  No cough, sputum production, dyspnea  He is trying to maintain an exercise regimen but his mobility is limited by hip pain and sciatica on the right.  Outpatient Encounter Medications as of 02/17/2019  Medication Sig  . amLODipine (NORVASC) 5 MG tablet Take 5 mg by mouth daily.  Marland Kitchen aspirin EC 81 MG tablet Take 1 tablet (81 mg total) by mouth daily.  Marland Kitchen atorvastatin (LIPITOR) 10 MG tablet Take 10 mg by mouth daily.  . blood glucose meter kit and supplies KIT One Touch Meter and Strips supplies for BID testing Dx: E11.9  . carvedilol (COREG) 12.5 MG tablet TAKE 1 TABLET TWICE A DAY  WITH A MEAL (Patient taking differently: Take 25 mg by mouth 2 (two)  times daily with a meal. )  . CINNAMON PO Take by mouth See admin instructions. Takes "half a spoonful" once a day.  Marland Kitchen GARLIC PO Take 1 capsule by mouth daily.  Marland Kitchen glucose blood (ONETOUCH VERIO) test strip Use as instructed once a day.  Dx: E11.65.  Need ov/lab  . metFORMIN (GLUCOPHAGE-XR) 750 MG 24 hr tablet TAKE 1 TABLET TWICE A DAY (Patient taking differently: Take 750 mg by mouth 2 (two) times daily. )  . ONE TOUCH LANCETS MISC UAD BID for Dx: E11.9  . Turmeric POWD Take by mouth See admin instructions. Takes "half a spoonful" once a day.  . Insulin Detemir (LEVEMIR FLEXTOUCH) 100 UNIT/ML Pen Inject 15 Units into the skin daily. (Patient not taking: Reported on 02/17/2019)   No facility-administered encounter medications on file as of 02/17/2019.   Physical Exam: Blood pressure 132/62, pulse 66, temperature 97.6 F (36.4 C), temperature source Temporal, height '5\' 4"'$  (1.626 m), weight 149 lb 12.8 oz (67.9 kg), SpO2 97 %. Gen:      No acute distress HEENT:  EOMI, sclera anicteric Neck:     No masses; no thyromegaly Lungs:    Clear to auscultation bilaterally; normal respiratory effort CV:         Regular rate and rhythm; no murmurs Abd:      + bowel sounds; soft, non-tender; no palpable masses, no distension Ext:    No edema; adequate peripheral perfusion Skin:      Warm and dry; no rash Neuro: alert and oriented x 3  Psych: normal mood and affect  Data Reviewed: Imaging: CT chest 10/20/2018-extensive bilateral groundglass opacities.  Predominantly in the lower lobes. Chest x-ray 10/31/2018-increasing bilateral opacities. 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  No honeycombing.  PFTs 02/04/2019 FVC 1.60, FEV1 1.41, TLC 4.21 [69%], DLCO 13.26 [62%] Suggestion of moderate restriction with diffusion impairment.  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  Overall he has had slow but gradual improvement with regard to dyspnea, exercise capacity. For symptoms of wheezing will try Symbicort inhaler Continue supplemental oxygen at night  Encouraged to keep up exercise regimen Follow-up in 3 months  Plan/Recommendations: -Trial of Symbicort inhaler Follow-up in 3 months.  Marshell Garfinkel MD [facility] 02/17/2019, 9:50 AM  CC: Carol Ada, MD

## 2019-02-17 NOTE — Addendum Note (Signed)
Addended by: Hildred Alamin I on: 02/17/2019 10:17 AM   Modules accepted: Orders

## 2019-03-30 ENCOUNTER — Telehealth: Payer: Self-pay | Admitting: Pulmonary Disease

## 2019-03-30 NOTE — Telephone Encounter (Signed)
Called and spoke to pt. Pt states he was schedule for a lower back surgery in 04/2018 but it was canceled due to [place]. Pt is now getting it rescheduled and is questioning what Dr. Matilde Bash standpoint is based on pt's pulmonary condition. The surgery is L4-5 maximum access posterior lumbar interbody fusion.   Dr. Vaughan Browner please advise. Thanks.

## 2019-03-31 NOTE — Telephone Encounter (Signed)
I placed a recall since Dr. Matilde Bash schedule is not ready yet. Nothing further is needed.

## 2019-03-31 NOTE — Telephone Encounter (Signed)
I called and spoke with patient's daughter.  We have decided to hold off on surgery as it is elective, nonurgent and reassess in summer Please make a follow-up visit to see me in the next 1 to 2 months.  Marshell Garfinkel MD [facility] 03/31/2019, 11:56 AM

## 2019-04-16 ENCOUNTER — Other Ambulatory Visit: Payer: Self-pay | Admitting: *Deleted

## 2019-04-16 NOTE — Patient Outreach (Signed)
  [facility]) Care Management Chronic Special Needs Program    04/16/2019  Name: Eddie Mcdaniel, DOB: [birth year]  MRN: [id number]   [name] is enrolled in a chronic special needs plan for Diabetes.  Ms. Eddie Mcdaniel is enrolled in a chronic special needs plan for  Diabetes. A completed health risk assessment has been received from the client.   The client's individualized care plan was developed based on available data.  Plan:   Send unsuccessful outreach letter with a copy of individualized care plan to client  Send individualized care plan to provider   Chronic care management coordinator, Jacqlyn Larsen [phone]), will attempt outreach in 2-4 months.  Jerilynn Birkenhead, RN, MSN, CCM, [place] Management Clinical Nurse Specialist Office Number [phone]

## 2019-05-08 ENCOUNTER — Telehealth: Payer: Self-pay | Admitting: Pulmonary Disease

## 2019-05-08 DIAGNOSIS — R0602 Shortness of breath: Secondary | ICD-10-CM

## 2019-05-08 NOTE — Telephone Encounter (Signed)
Called and spoke with pt. Pt is wanting to know if he will be repeating the CT and if so when it should be repeated. Pt's last CT was performed 11/112/20. Dr. Vaughan Browner, please advise on this for pt.

## 2019-05-10 NOTE — Telephone Encounter (Signed)
Our plan is to get follow up high res CT scan 6 months after last scan Please order for around mid may and follow up in clinic.  Marshell Garfinkel MD [facility] 05/10/2019, 8:53 AM

## 2019-05-11 NOTE — Telephone Encounter (Signed)
I called and spoke with the patient and made him aware. I have scheduled him for a return visit for 06/24/19 and will make sure we have an order for his CT scan to be done in May before this visit.

## 2019-06-10 ENCOUNTER — Other Ambulatory Visit: Payer: Self-pay | Admitting: *Deleted

## 2019-06-10 ENCOUNTER — Other Ambulatory Visit: Payer: Self-pay

## 2019-06-10 ENCOUNTER — Encounter: Payer: Self-pay | Admitting: *Deleted

## 2019-06-10 ENCOUNTER — Ambulatory Visit (HOSPITAL_BASED_OUTPATIENT_CLINIC_OR_DEPARTMENT_OTHER)
Admission: RE | Admit: 2019-06-10 | Discharge: 2019-06-10 | Disposition: A | Discharge status: Home / Self Care | Payer: HMO | Source: Ambulatory Visit | Attending: Pulmonary Disease | Admitting: Pulmonary Disease

## 2019-06-10 DIAGNOSIS — R0602 Shortness of breath: Secondary | ICD-10-CM

## 2019-06-10 NOTE — Patient Outreach (Signed)
[facility]) Care Management Chronic Special Needs Program  06/10/2019  Name: Eddie Mcdaniel DOB: May 27, 1940  MRN: [id number]  [name] is enrolled in a chronic special needs plan for Diabetes. Chronic Care Management Coordinator telephoned client to review health risk assessment and to develop individualized care plan.  Introduced the chronic care management program, importance of client participation, and taking their care plan to all provider appointments and inpatient facilities.  Reviewed the transition of care process and possible referral to community care management.  Subjective: Per client, lives with spouse, is independent in all aspects of care, still drives, does home exercises daily, a lot of stretching, had Covid-19 last September and has slowly been recovering, was on oxygen but now discontinued, reports having CT of lungs today as "trying to get clearance for back surgery for numbness in foot and leg".  Client requests that blood pressure cuff be sent as he wants to monitor his blood pressure, client reports he does have HTA calendar and 24 hour nurse line magnet, is aware of HTA conciegre number.  Client reports recent AIC of 6.9 is improvement and client glad about this, client reports he is a vegetarian and has had no issues with his diet as he eats mostly vegetables.    Goals Addressed            This Visit's Progress   . "to have my back surgery to help with tingling in leg and foot" (pt-stated)       Continue to follow up with your doctor and have all needed tests to be cleared for surgery Call HTA conciegre for any benefit questions at [phone]    . Client understands the importance of follow-up with providers by attending scheduled visits   On track    Review of medical record indicates client has completed 7 visits with primary care provider in 2020. Continue to keep your scheduled appointments with your providers.     .  COMPLETED: Client will use Assistive Devices as needed and verbalize understanding of device use      . Client will verbalize knowledge of chronic lung disease as evidenced by no ED visits or Inpatient stays related to chronic lung disease        Please continue to follow up with your provider as scheduled. Client reports is scheduled to have CT of lungs 06/10/2019 RN care manager mailed EMMI education article "Recovery after Covid-19" Use the 24 hour nurse advice line at [phone] as needed     . Client will verbalize knowledge of self management of Hypertension as evidences by BP reading of 140/90 or less; or as defined by provider       Take blood pressure medications as prescribed. Plan to check blood pressure regularly and take results to your health care provider appointments. Plan to follow a low salt diet. Increase activity as tolerated. Follow up with your health care provider as recommended. Please ask your health care provider " what is my target blood pressure range". Blood pressure 132/62 on 02/17/19 RN care manager mailed EMMI education article "High blood pressure (hypertension): Taking your blood pressure" RN care manager mailed to client's home a blood pressure cuff     . HEMOGLOBIN A1C < 7.0       Per medical record review Hgb AIC completed on 01/06/19   6.9 Diabetes self management actions as follows- Continue to keep your follow up appointments with your provider and have lab work completed as recommended.  Monitor glucose (blood sugar) per health care provider recommendation. Check feet daily Visit health care provider every 3-6 months as directed. It is important to have your Hgb AIC checked every 3-6 months (every 6 months if you are at goal and every 3 months if you are not at goal). Eye exam yearly Carbohydrate controlled meal planning Take diabetes medications as prescribed by health care provider Physical activity RN care manager mailed EMMI education  article "Diabetes care checklist"      . Maintain timely refills of diabetic medication as prescribed within the year .       Take medications as prescribed. Follow up with your health care provider if you have any questions. Please contact your assigned RN care manager if you have difficulty obtaining medications.    . Obtain annual  Lipid Profile, LDL-C   On track    Per medical record review, Lipid profile completed on 06/03/18 LDL= 58 The goal for LDL is less than 70mg /dl as you are at high risk for complications. Try to avoid saturated fats, trans-fats and eat more fiber. Continue to follow up with your health care provider for any needed lab work.     . Obtain Annual Eye (retinal)  Exam    On track    Per medical record review, client had eye exam on 07/17/18 Plan to have dilated eye exam every year Plan to schedule your eye exam yearly     . Obtain Annual Foot Exam   On track    Client states provider checks feet at every visit Per client report, foot exam completed on 06/04/19 Your doctor should check your feet at least once a year. Plan to schedule a foot exam with your health care provider once every year. Check your skin and feet daily for cuts, bruises, redness, blisters or sore.     . Obtain annual screen for micro albuminuria (urine) , nephropathy (kidney problems)   On track    Per medical record review, microalbuminuria completed on 06/03/18 Continue to obtain yearly physicals and lab checks as recommended by your health care provider. It is important for your health care provider to check your urine for protein at least once every year.      . Obtain Hemoglobin A1C at least 2 times per year   On track    Review of medical records indications last A1C 7.4 on 10/29/2018 and 6.9 on 01/06/19  Continue to keep all scheduled appointments with your provider and lab work as recommended         Plan:  RN care Freight forwarder faxed today's note with updated individualized care  plan to primary care provider, mailed updated individualized care plan to client along with educational materials, consent form.  Chronic care management coordination will outreach in:  9-12 months    Kassie Mends Nursing/RN [place] Case Manager, C-SNP  [phone]

## 2019-06-24 ENCOUNTER — Other Ambulatory Visit: Payer: Self-pay

## 2019-06-24 ENCOUNTER — Telehealth: Payer: Self-pay | Admitting: Pulmonary Disease

## 2019-06-24 ENCOUNTER — Encounter: Payer: Self-pay | Admitting: Pulmonary Disease

## 2019-06-24 ENCOUNTER — Ambulatory Visit: Payer: HMO | Admitting: Pulmonary Disease

## 2019-06-24 VITALS — BP 122/76 | HR 89 | Temp 98.2°F | Ht 65.0 in | Wt 149.6 lb

## 2019-06-24 DIAGNOSIS — R0602 Shortness of breath: Secondary | ICD-10-CM

## 2019-06-24 NOTE — Patient Instructions (Signed)
I am glad you are doing well with regard to your breathing His CT scan shows good improvement in the inflammation from COVID-19 I will discuss with your son and feel that you are okay to proceed with the spine surgery  We will get a follow-up CT high-resolution in 6 months and return to clinic after scan.

## 2019-06-24 NOTE — Addendum Note (Signed)
Addended by: Satira Sark D on: 06/24/2019 03:49 PM   Modules accepted: Orders

## 2019-06-24 NOTE — Progress Notes (Signed)
Eddie Mcdaniel    [id number]    1940-05-23  Primary Care Physician:Smith, Hal Hope, MD  Referring Physician: Carol Ada, [place] [employer],  [place] [address]  Chief complaint: Follow-up for post Covid pneumonia  HPI: 79 year old with hypertension, hyperlipidemia, diabetes, coronary artery disease, GERD.  Admitted from 9/16-10/6 at [facility] for COVID-19 pneumonia with prolonged hypoxic respiratory failure Treated with remdesivir, convalescent plasma.  He got initially 10 days of steroids but due to persistent dyspnea and hypoxia he was given a longer course of steroids.  However he developed hypoglycemia while on steroids.  Discharged on prednisone 10 mg a day which he continued until 12/04/2018  He was on supplemental oxygen during the day which he has stopped.  He is still using 1 to 2 L at night  Pets: No pets Occupation: Retired Clinical biochemist Exposures: No known exposures.  No mold, hot tub, Jacuzzi, down pillows or comforters Smoking history: Never smoker Travel history: No significant travel history Relevant family history: No significant family history of lung disease  Interim history: Continues to improve in terms of exercise capacity and dyspnea He is trying to maintain an exercise regimen but his mobility is limited by hip pain and sciatica on the right. Planned back surgery was held as we wanted to see him improved from his COVID-19  He had a repeat CT chest here for review.  Outpatient Encounter Medications as of 06/24/2019  Medication Sig  . amLODipine (NORVASC) 5 MG tablet Take 5 mg by mouth daily.  Marland Kitchen aspirin EC 81 MG tablet Take 1 tablet (81 mg total) by mouth daily.  Marland Kitchen atorvastatin (LIPITOR) 10 MG tablet Take 10 mg by mouth daily.  . blood glucose meter kit and supplies KIT One Touch Meter and Strips supplies for BID testing Dx: E11.9  . carvedilol (COREG) 12.5 MG tablet TAKE 1 TABLET TWICE A DAY  WITH A MEAL (Patient taking  differently: Take 25 mg by mouth 2 (two) times daily with a meal. )  . CINNAMON PO Take by mouth See admin instructions. Takes "half a spoonful" once a day.  Marland Kitchen GARLIC PO Take 1 capsule by mouth daily.  Marland Kitchen glucose blood (ONETOUCH VERIO) test strip Use as instructed once a day.  Dx: E11.65.  Need ov/lab  . metFORMIN (GLUCOPHAGE-XR) 750 MG 24 hr tablet TAKE 1 TABLET TWICE A DAY (Patient taking differently: Take 750 mg by mouth 2 (two) times daily. )  . ONE TOUCH LANCETS MISC UAD BID for Dx: E11.9  . Turmeric POWD Take by mouth See admin instructions. Takes "half a spoonful" once a day.  . hydrochlorothiazide (HYDRODIURIL) 12.5 MG tablet Take 12.5 mg by mouth every morning.  . [DISCONTINUED] budesonide-formoterol (SYMBICORT) 80-4.5 MCG/ACT inhaler Inhale 2 puffs into the lungs 2 (two) times daily. (Patient not taking: Reported on 06/10/2019)  . [DISCONTINUED] Insulin Detemir (LEVEMIR FLEXTOUCH) 100 UNIT/ML Pen Inject 15 Units into the skin daily. (Patient not taking: Reported on 02/17/2019)   No facility-administered encounter medications on file as of 06/24/2019.   Physical Exam: Blood pressure 122/76, pulse 89, temperature 98.2 F (36.8 C), temperature source Oral, height _0  (1.651 m), weight 149 lb 9.6 oz (67.9 kg), SpO2 96 %. Gen:      No acute distress HEENT:  EOMI, sclera anicteric Neck:     No masses; no thyromegaly Lungs:    Clear to auscultation bilaterally; normal respiratory effort CV:  Regular rate and rhythm; no murmurs Abd:      + bowel sounds; soft, non-tender; no palpable masses, no distension Ext:    No edema; adequate peripheral perfusion Skin:      Warm and dry; no rash Neuro: alert and oriented x 3 Psych: normal mood and affect  Data Reviewed: Imaging: CT chest 10/20/2018-extensive bilateral groundglass opacities.  Predominantly in the lower lobes. Chest x-ray 10/31/2018-increasing bilateral opacities. High-resolution CT 12/11/2018-bilateral groundglass attenuation  with bronchiectasis and bronchiolectasis.  No honeycombing. 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 I have reviewed the images personally.  PFTs  02/04/2019 FVC 1.60, FEV1 1.41, TLC 4.21 [69%], DLCO 13.26 [62%] Suggestion of moderate restriction with diffusion impairment.  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  Plan/Recommendations: -Follow-up CT in 6 months.   Marshell Garfinkel MD [facility] 06/24/2019, 3:39 PM  CC: Carol Ada, MD

## 2019-06-25 NOTE — Telephone Encounter (Signed)
This was scheduled by [place], not a [place].  I have cancelled the appointment with [facility] [place].   Sched w/ [facility] 11/15 @ 1:30.

## 2019-07-30 ENCOUNTER — Other Ambulatory Visit: Payer: Self-pay | Admitting: Neurosurgery

## 2019-07-30 ENCOUNTER — Telehealth: Payer: Self-pay | Admitting: *Deleted

## 2019-07-30 NOTE — Telephone Encounter (Signed)
CALLED PT AND DISCUSSED CARDIAC CLEARANCE SCHEDULED APPT 08/12/2019 @ 1:20 PM W/DR END VERBALIZES UNDERSTANDING

## 2019-07-30 NOTE — Telephone Encounter (Signed)
Primary Cardiologist:Christopher End, MD  Chart reviewed as part of pre-operative protocol coverage. Because of Eddie Mcdaniel's past medical history and time since last visit, he/she will require a follow-up visit in order to better assess preoperative cardiovascular risk.  Pre-op covering staff: - Please schedule appointment and call patient to inform them. - Please contact requesting surgeon's office via preferred method (i.e, phone, fax) to inform them of need for appointment prior to surgery.  If applicable, this message will also be routed to pharmacy pool and/or primary cardiologist for input on holding anticoagulant/antiplatelet agent as requested below so that this information is available at time of patient's appointment.   Deberah Pelton, NP  07/30/2019, 2:32 PM

## 2019-07-30 NOTE — Telephone Encounter (Signed)
   [facility] Pre-operative Risk Assessment    HEARTCARE STAFF: - Please ensure there is not already an duplicate clearance open for this procedure. - Under Visit Info/Reason for Call, type in Other and utilize the format Clearance MM/DD/YY or Clearance TBD. Do not use dashes or single digits. - If request is for dental extraction, please clarify the # of teeth to be extracted.  Request for surgical clearance:  1. What type of surgery is being performed? LUMBAR FUSION   2. When is this surgery scheduled? 09/10/19   3. What type of clearance is required (medical clearance vs. Pharmacy clearance to hold med vs. Both)? MEDICAL  4. Are there any medications that need to be held prior to surgery and how long? ASA    5. Practice name and name of physician performing surgery? [facility]; DR. Broadus John STERN   6. What is the office phone number? [phone]   7.   What is the office fax number? [place]: [id number]  8.   Anesthesia type (None, local, MAC, general) ? GENERAL   Julaine Hua 07/30/2019, 2:12 PM  _________________________________________________________________   (provider comments below)

## 2019-08-11 NOTE — Progress Notes (Signed)
Follow-up Outpatient Visit Date: 08/12/2019  Primary Care Provider: Carol Ada, [place] [address]  Chief Complaint: Leg swelling and back pain  HPI:  Eddie Mcdaniel is a 79 y.o. male with history of coronary artery disease status post PCI to mid LAD in 10/2015, COVID-19 infection (09/2018), type 2 diabetes mellitus, hypertension, hyperlipidemia, and GERD, who presents for follow-up of coronary artery disease as well as preoperative clearance in anticipation of lumbar spine fusion.  He was last seen in our office in 12/2017, at which time he was doing well without chest pain, shortness of breath, palpitations, or lightheadedness.  He continues to walk regularly and is difficulty other than the low back pain with associated radiculopathy.  He is scheduled for lumbar spine surgery with Dr. Vertell Limber on 09/10/2019.  Today, Eddie Mcdaniel is most concerned about continued back pain radiating into his legs, especially when walking uphill.  He continues to walk 1.5 miles daily on a level surface without limitations.  He denies chest pain, shortness of breath, palpitations, and lightheadedness.  Eddie Mcdaniel notes bilateral pedal and ankle edema that developed during his COVID-19 hospitalization last September.  It has improved somewhat but never totally resolved.  On account of this, his PCP recently ordered an echocardiogram, which showed normal LVEF with mild aortic regurgitation and mildly elevated PA pressure.  Had some leg swelling this spring. Had echo through PCP.  Comes and goes.  Began after hospitalized with COVID.  Put on on HCTZ by PCP, no change.  No chest pain.  B/c low back problems, having buttock pain.  With walking, some tingling into feet.  Walking ~2 miles the last few weeks.  No sx like before stent.  --------------------------------------------------------------------------------------------------  Past Medical History:  Diagnosis Date  . Contrast media  allergy   . Coronary artery disease    a. USA/abnl nuc s/p  LHC 11/18/15 showed diffusely disease LAD s/p DES to LAD, mod-severe ostial diagonal disease with tiny diag jailed by stent - given small size, PCI not performed, mod nonobstructive mRCA disease, EF 55-65%.  Marland Kitchen GERD (gastroesophageal reflux disease)   . Hx of adenomatous polyp of colon 10/06/2015  . Hyperlipidemia   . Hypertension   . Type II diabetes mellitus ([place])    Past Surgical History:  Procedure Laterality Date  . CARDIAC CATHETERIZATION N/A 11/17/2015   Procedure: Left Heart Cath and Coronary Angiography;  Surgeon: Nelva Bush, MD;  Location: [facility];  Service: Cardiovascular;  Laterality: N/A;  . CARDIAC CATHETERIZATION N/A 11/17/2015   Procedure: Coronary Stent Intervention;  Surgeon: Nelva Bush, MD;  Location: [facility];  Service: Cardiovascular;  Laterality: N/A;  . CATARACT EXTRACTION W/ INTRAOCULAR LENS  IMPLANT, BILATERAL Bilateral 2005  . CORONARY ANGIOPLASTY    . SHOULDER ARTHROSCOPY W/ ROTATOR CUFF REPAIR Right 2000s X 1  . SHOULDER OPEN ROTATOR CUFF REPAIR Right 2000s X 1   "after 1st OR didn't work; had to put tissue in this time"    Current Meds  Medication Sig  . amLODipine (NORVASC) 5 MG tablet Take 5 mg by mouth daily.  Marland Kitchen ascorbic acid (VITAMIN C) 500 MG tablet Take 500 mg by mouth daily.  Marland Kitchen aspirin EC 81 MG tablet Take 162 mg by mouth daily. Swallow whole.  Marland Kitchen atorvastatin (LIPITOR) 10 MG tablet Take 10 mg by mouth daily.  . blood glucose meter kit and supplies KIT One Touch Meter and Strips supplies for BID testing Dx:  E11.9  . carvedilol (COREG) 12.5 MG tablet Take 12.5 mg by mouth 2 (two) times daily with a meal.  . CINNAMON PO Take by mouth See admin instructions. Takes "half a spoonful" once a day.  . Ferrous Sulfate (IRON PO) Take 65 mg by mouth daily.  Marland Kitchen GARLIC PO Take 1 capsule by mouth daily.  Marland Kitchen glucose blood (ONETOUCH VERIO) test strip Use as instructed once a day.   Dx: E11.65.  Need ov/lab  . hydrochlorothiazide (HYDRODIURIL) 12.5 MG tablet Take 12.5 mg by mouth every morning.  . metFORMIN (GLUCOPHAGE-XR) 750 MG 24 hr tablet Take 750 mg by mouth 2 (two) times daily.  . ONE TOUCH LANCETS MISC UAD BID for Dx: E11.9  . tamsulosin (FLOMAX) 0.4 MG CAPS capsule Take 0.4 mg by mouth daily as needed.  . Turmeric POWD Take by mouth See admin instructions. Takes "half a spoonful" once a day.    Allergies: Ace inhibitors and Contrast media [iodinated diagnostic agents]  Social History   Tobacco Use  . Smoking status: Never Smoker  . Smokeless tobacco: Former Systems developer    Types: Secondary school teacher  . Vaping Use: Never used  Substance Use Topics  . Alcohol use: No  . Drug use: No    Family History  Problem Relation Age of Onset  . Diabetes Mother   . Hypertension Mother   . Cancer Brother 21       brain, stomach  . Hyperlipidemia Brother   . Heart disease Brother        MI  . Heart disease Brother        MI  . Diabetes Brother   . Stroke Brother   . Hypertension Brother   . Colon cancer Neg Hx   . Esophageal cancer Neg Hx   . Rectal cancer Neg Hx   . Stomach cancer Neg Hx     Review of Systems: A 12-system review of systems was performed and was negative except as noted in the HPI.  --------------------------------------------------------------------------------------------------  Physical Exam: BP 120/66 (BP Location: Left Arm, Patient Position: Sitting, Cuff Size: Normal)   Pulse 64   Ht '5\' 4"'  (1.626 m)   Wt 145 lb (65.8 kg)   SpO2 95%   BMI 24.89 kg/m   General:  NAD. Neck: No JVD or HJR. Lungs: CTA bilaterally. Heart: RRR w/o m/r/g. Abdomen: Soft, NT/ND Extremities: Trace distal calf and ankle edema bilaterally.  EKG:  NSR without abnormality.  Lab Results  Component Value Date   WBC 11.3 (H) 11/03/2018   HGB 10.2 (L) 11/03/2018   HCT 30.7 (L) 11/03/2018   MCV 89.5 11/03/2018   PLT 208 11/03/2018    Lab Results    Component Value Date   NA 130 (L) 11/03/2018   K 4.7 11/03/2018   CL 95 (L) 11/03/2018   CO2 27 11/03/2018   BUN 18 11/03/2018   CREATININE 0.66 11/03/2018   GLUCOSE 257 (H) 11/03/2018   ALT 65 (H) 10/30/2018    Lab Results  Component Value Date   CHOL 91 05/07/2017   HDL 33.90 (L) 05/07/2017   LDLCALC 42 05/07/2017   TRIG 97 10/15/2018   CHOLHDL 3 05/07/2017    --------------------------------------------------------------------------------------------------  ASSESSMENT AND PLAN: Coronary artery disease: Mr. Reither continues to do well from a heart standpoint following PCI to the LAD in 2017.  We will continue his current medications for secondary prevention, including aspirin and atorvastatin.  Leg swelling: Present for several months without other signs  or symptoms of heart failure.  Recent outside echo showed normal LVEF with grade 1 diastolic dysfunction, mild aortic regurgitation, and moderate tricuspid regurgitation.  PA pressure was mildly elevated.  While Eddie Mcdaniel may have a component of HFpEF, it is possible that leg edema may be due to continued amlodipine use.  As his BP has been well-controlled at home, I have instructed him to discontinue amlodipine.  We will continue HCTZ for BP control and gentle diuresis.  Hypertension: BP well-controlled today as well as at home.  As above, we will hold amlodipine to see if Eddie Mcdaniel's leg swelling improves.  He should continue the remainder of his medications.  I would favor escalation of HCTZ if his BP rises above goal (target < 130/80).  Hyperlipidemia: Most recent lipid panel through PCP showed LDL < 70.  Continue current dose of atorvastatin.  Preop evaluation: As Eddie Mcdaniel is able to complete > 4 METS of activity without angina, I feel that it is reasonable to proceed with planned back surgery without additional testing or intervention.  Given history of PCI to the LAD, I suggest that aspirin 81 mg daily be continued  during the perioperative period, if possible.  If it must be held due to risk for significant bleeding, it should be held 1 week before surgery and restarted as soon as possible from a post-op standpoint.  Follow-up: Eddie Mcdaniel wishes to continue follow-up at our [facility]. office.  We will have him see Richardson Dopp, PA, in ~3 months and then establish with a new physician thereafter.  Nelva Bush, MD 08/12/2019 1:42 PM

## 2019-08-12 ENCOUNTER — Ambulatory Visit: Payer: HMO | Admitting: Internal Medicine

## 2019-08-12 ENCOUNTER — Other Ambulatory Visit: Payer: Self-pay

## 2019-08-12 ENCOUNTER — Encounter: Payer: Self-pay | Admitting: Internal Medicine

## 2019-08-12 ENCOUNTER — Encounter (INDEPENDENT_AMBULATORY_CARE_PROVIDER_SITE_OTHER): Payer: Self-pay

## 2019-08-12 VITALS — BP 120/66 | HR 64 | Ht 64.0 in | Wt 145.0 lb

## 2019-08-12 DIAGNOSIS — I2510 Atherosclerotic heart disease of native coronary artery without angina pectoris: Secondary | ICD-10-CM | POA: Diagnosis not present

## 2019-08-12 DIAGNOSIS — E785 Hyperlipidemia, unspecified: Secondary | ICD-10-CM

## 2019-08-12 DIAGNOSIS — I10 Essential (primary) hypertension: Secondary | ICD-10-CM | POA: Diagnosis not present

## 2019-08-12 DIAGNOSIS — M7989 Other specified soft tissue disorders: Secondary | ICD-10-CM

## 2019-08-12 DIAGNOSIS — Z01810 Encounter for preprocedural cardiovascular examination: Secondary | ICD-10-CM

## 2019-08-12 NOTE — Patient Instructions (Signed)
Medication Instructions:  Your physician has recommended you make the following change in your medication:  STOP Amlodipine.  *If you need a refill on your cardiac medications before your next appointment, please call your pharmacy*    Follow-Up: At [facility], you and your health needs are our priority.  As part of our continuing mission to provide you with exceptional heart care, we have created designated Provider Care Teams.  These Care Teams include your primary Cardiologist (physician) and Advanced Practice Providers (APPs -  Physician Assistants and Nurse Practitioners) who all work together to provide you with the care you need, when you need it.  We recommend signing up for the patient portal called "MyChart".  Sign up information is provided on this After Visit Summary.  MyChart is used to connect with patients for Virtual Visits (Telemedicine).  Patients are able to view lab/test results, encounter notes, upcoming appointments, etc.  Non-urgent messages can be sent to your provider as well.   To learn more about what you can do with MyChart, go to [email or web address].    Your next appointment:   3 month(s)  The format for your next appointment:   In Person  Provider:   Richardson Dopp, PA-C - Then get you with primary cardiologist at that location.

## 2019-08-13 ENCOUNTER — Encounter: Payer: Self-pay | Admitting: Internal Medicine

## 2019-08-13 DIAGNOSIS — M7989 Other specified soft tissue disorders: Secondary | ICD-10-CM | POA: Insufficient documentation

## 2019-08-14 ENCOUNTER — Telehealth: Payer: Self-pay | Admitting: *Deleted

## 2019-08-14 NOTE — Telephone Encounter (Signed)
Patient verbalized understanding and was appreciative

## 2019-08-14 NOTE — Telephone Encounter (Signed)
-----   Message from Eddie Bush, MD sent at 08/14/2019  1:20 PM EDT ----- Regarding: Echo follow-up Hi Anderson Malta,  Do you mind calling Mr. Klee to let him know that I have reviewed his echo images and that I agree with the interpretation by the outside echo reader?  Meriam Sprague is working on mailing the disk with the echo images back to Mr. Wickens.  Thanks.  Gerald Stabs

## 2019-09-01 NOTE — Pre-Procedure Instructions (Signed)
Eddie Mcdaniel  09/01/2019      [facility] - [place], [place] - [address] [place] [address] Phone: [phone] Fax: [phone]    Your procedure is scheduled on Aug. 12  Report to [facility] Entrance A at 5:30 A.M.  Call this number if you have problems the morning of surgery:  [phone]   Remember:  Do not eat or drink after midnight.      Take these medicines the morning of surgery with A SIP OF WATER :              Atorvastatin (lipitor)             Carvedilol (coreg)                         As needed: tamsulosin (flomax)           7 days prior to surgery STOP taking any Aspirin (unless otherwise instructed by your surgeon), Aleve, Naproxen, Ibuprofen, Motrin, Advil, Goody's, BC's, all herbal medications, fish oil, and all vitamins.               Follow your surgeon's instructions on when to stop Aspirin.  If no instructions were given by your surgeon then you will need to call the office to get those instructions.                         How to Manage Your Diabetes Before and After Surgery  Why is it important to control my blood sugar before and after surgery?  Improving blood sugar levels before and after surgery helps healing and can limit problems.  A way of improving blood sugar control is eating a healthy diet by: o  Eating less sugar and carbohydrates o  Increasing activity/exercise o  Talking with your doctor about reaching your blood sugar goals  High blood sugars (greater than 180 mg/dL) can raise your risk of infections and slow your recovery, so you will need to focus on controlling your diabetes during the weeks before surgery.  Make sure that the doctor who takes care of your diabetes knows about your planned surgery including the date and location.  How do I manage my blood sugar before surgery?  Check your blood sugar at least 4 times a day, starting 2 days before  surgery, to make sure that the level is not too high or low. o Check your blood sugar the morning of your surgery when you wake up and every 2 hours until you get to the Short Stay unit.  If your blood sugar is less than 70 mg/dL, you will need to treat for low blood sugar: o Do not take insulin. o Treat a low blood sugar (less than 70 mg/dL) with  cup of clear juice (cranberry or apple), 4 glucose tablets, OR glucose gel. Recheck blood sugar in 15 minutes after treatment (to make sure it is greater than 70 mg/dL). If your blood sugar is not greater than 70 mg/dL on recheck, call [phone] o  for further instructions.  Report your blood sugar to the short stay nurse when you get to Short Stay.   If you are admitted to the hospital after surgery: o Your blood sugar will be checked by the staff and you will probably be given insulin after surgery (instead of oral diabetes medicines) to make sure you  have good blood sugar levels. o The goal for blood sugar control after surgery is 80-180 mg/dL.      WHAT DO I DO ABOUT MY DIABETES MEDICATION?    Do not take oral diabetes medicines (pills) the morning of surgery. (metformin/glucophage-xr)   THE NIGHT BEFORE SURGERY, take ___________ units of ___________insulin.        THE MORNING OF SURGERY, take _____________ units of __________insulin.   The day of surgery, do not take other diabetes injectables, including Byetta (exenatide), Bydureon (exenatide ER), Victoza (liraglutide), or Trulicity (dulaglutide).   If your CBG is greater than 220 mg/dL, you may take  of your sliding scale (correction) dose of insulin.      Do not wear jewelry.  Do not wear lotions, powders, or perfumes, or deodorant.  Do not shave 48 hours prior to surgery.  Men may shave face and neck.  Do not bring valuables to the hospital.  [facility] is not responsible for any belongings or valuables.  Contacts, dentures or bridgework may not be worn into  surgery.  Leave your suitcase in the car.  After surgery it may be brought to your room.  For patients admitted to the hospital, discharge time will be determined by your treatment team.  Patients discharged the day of surgery will not be allowed to drive home.    Special instructions:   [facility]- Preparing For Surgery  Before surgery, you can play an important role. Because skin is not sterile, your skin needs to be as free of germs as possible. You can reduce the number of germs on your skin by washing with CHG (chlorahexidine gluconate) Soap before surgery.  CHG is an antiseptic cleaner which kills germs and bonds with the skin to continue killing germs even after washing.    Oral Hygiene is also important to reduce your risk of infection.  Remember - BRUSH YOUR TEETH THE MORNING OF SURGERY WITH YOUR REGULAR TOOTHPASTE  Please do not use if you have an allergy to CHG or antibacterial soaps. If your skin becomes reddened/irritated stop using the CHG.  Do not shave (including legs and underarms) for at least 48 hours prior to first CHG shower. It is OK to shave your face.  Please follow these instructions carefully.   1. Shower the NIGHT BEFORE SURGERY and the MORNING OF SURGERY with CHG.   2. If you chose to wash your hair, wash your hair first as usual with your normal shampoo.  3. After you shampoo, rinse your hair and body thoroughly to remove the shampoo.  4. Use CHG as you would any other liquid soap. You can apply CHG directly to the skin and wash gently with a scrungie or a clean washcloth.   5. Apply the CHG Soap to your body ONLY FROM THE NECK DOWN.  Do not use on open wounds or open sores. Avoid contact with your eyes, ears, mouth and genitals (private parts). Wash Face and genitals (private parts)  with your normal soap.  6. Wash thoroughly, paying special attention to the area where your surgery will be performed.  7. Thoroughly rinse your body with warm water from the  neck down.  8. DO NOT shower/wash with your normal soap after using and rinsing off the CHG Soap.  9. Pat yourself dry with a CLEAN TOWEL.  10. Wear CLEAN PAJAMAS to bed the night before surgery, wear comfortable clothes the morning of surgery  11. Place CLEAN SHEETS on your bed the night  of your first shower and DO NOT SLEEP WITH PETS.    Day of Surgery:  Do not apply any deodorants/lotions.  Please wear clean clothes to the hospital/surgery center.   Remember to brush your teeth WITH YOUR REGULAR TOOTHPASTE.    Please read over the following fact sheets that you were given.

## 2019-09-02 ENCOUNTER — Other Ambulatory Visit: Payer: Self-pay

## 2019-09-02 ENCOUNTER — Encounter (HOSPITAL_COMMUNITY): Payer: Self-pay

## 2019-09-02 ENCOUNTER — Encounter (HOSPITAL_COMMUNITY)
Admission: RE | Admit: 2019-09-02 | Discharge: 2019-09-02 | Disposition: A | Discharge status: Home / Self Care | Payer: HMO | Source: Ambulatory Visit | Attending: Neurosurgery | Admitting: Neurosurgery

## 2019-09-02 DIAGNOSIS — Z01812 Encounter for preprocedural laboratory examination: Secondary | ICD-10-CM | POA: Diagnosis present

## 2019-09-02 LAB — TYPE AND SCREEN
ABO/RH(D): A POS
Antibody Screen: NEGATIVE

## 2019-09-02 LAB — BASIC METABOLIC PANEL
Anion gap: 5 (ref 5–15)
BUN: 8 mg/dL (ref 8–23)
CO2: 28 mmol/L (ref 22–32)
Calcium: 9.4 mg/dL (ref 8.9–10.3)
Chloride: 99 mmol/L (ref 98–111)
Creatinine, Ser: 0.74 mg/dL (ref 0.61–1.24)
GFR calc Af Amer: >60 mL/min (ref >60)
GFR calc non Af Amer: >60 mL/min (ref >60)
Glucose, Bld: 111 mg/dL — ABNORMAL HIGH (ref 70–99)
Potassium: 4.2 mmol/L (ref 3.5–5.1)
Sodium: 132 mmol/L — ABNORMAL LOW (ref 135–145)

## 2019-09-02 LAB — CBC
HCT: 37.4 % — ABNORMAL LOW (ref 39.0–52.0)
Hemoglobin: 12.3 g/dL — ABNORMAL LOW (ref 13.0–17.0)
MCH: 29.4 pg (ref 26.0–34.0)
MCHC: 32.9 g/dL (ref 30.0–36.0)
MCV: 89.3 fL (ref 80.0–100.0)
Platelets: 187 10*3/uL (ref 150–400)
RBC: 4.19 MIL/uL — ABNORMAL LOW (ref 4.22–5.81)
RDW: 12.9 % (ref 11.5–15.5)
WBC: 9.8 10*3/uL (ref 4.0–10.5)
nRBC: 0 % (ref 0.0–0.2)

## 2019-09-02 LAB — HEMOGLOBIN A1C
Hgb A1c MFr Bld: 6.5 % — ABNORMAL HIGH (ref 4.8–5.6)
Mean Plasma Glucose: 139.85 mg/dL

## 2019-09-02 LAB — SURGICAL PCR SCREEN
MRSA, PCR: NEGATIVE
Staphylococcus aureus: NEGATIVE

## 2019-09-02 LAB — GLUCOSE, CAPILLARY: Glucose-Capillary: 131 mg/dL — ABNORMAL HIGH (ref 70–99)

## 2019-09-02 NOTE — Progress Notes (Signed)
PCP - candice Laverle Pillard Cardiologist - christopher End Pulm: Mannam   Chest x-ray -  na EKG - 08/12/19 Stress Test - 11/08/15 ECHO - 06/19/19 Cardiac Cath - 11/17/15  Sleep Study -  12/2016 @ [facility] - pt. refused  Fasting Blood Sugar - 118-125 Checks Blood Sugar ___1-2__ times a day  Blood Thinner Instructions: na Aspirin Instructions: 09/03/19  ERAS Protcol -na   COVID TEST- 09/07/19   Anesthesia review: medical/cardiac Hx. , clearance in notes Dr. End/Dr. Mannam/ Covid hospitalization  10/15/18-- 11/04/18  Patient denies shortness of breath, fever, cough and chest pain at PAT appointment   All instructions explained to the patient, with a verbal understanding of the material. Patient agrees to go over the instructions while at home for a better understanding. Patient also instructed to self quarantine after being tested for COVID-19. The opportunity to ask questions was provided.

## 2019-09-03 NOTE — H&P (Signed)
Patient ID:   [id number] Patient: Eddie Mcdaniel  Date of Birth: [birth year] Visit Type: Office Visit   Date: 07/29/2019 12:15 PM Provider: Marchia Meiers. Vertell Limber MD   This 79 year old male presents for back pain.  HISTORY OF PRESENT ILLNESS: 1.  back pain  I reviewed the patient's lumbar MRI which demonstrates severe spinal stenosis with spondylolisthesis at the L4-5 level.  This is without significant change from his previous imaging.  My surgical recommendation remains the same.  This will consist of L4-5 PLIF.  The patient is very limited because of pain and wants to go ahead with surgery.  He has had prior cardiac stents and because of this I have recommended cardiac clearance although he is currently without significant symptoms of chest pain      Medical/Surgical/Interim History Reviewed, no change.  Last detailed document date:04/23/2016.     PAST MEDICAL HISTORY, SURGICAL HISTORY, FAMILY HISTORY, SOCIAL HISTORY AND REVIEW OF SYSTEMS I have reviewed the patient's past medical, surgical, family and social history as well as the comprehensive review of systems as included on the [facility] history form dated 07/13/2019, which I have signed.  Family History: Reviewed, no changes.  Last detailed document date:04/23/2016.   Social History: Reviewed, no changes. Last detailed document date: 04/23/2016.    MEDICATIONS: (added, continued or stopped this visit) Started Medication Directions Instruction Stopped  amlodipine 10 mg tablet take 1 tablet by oral route  every day    Aspir-81 81 mg tablet,delayed release take 1 tablet by oral route  every day    atorvastatin 20 mg tablet take 1 tablet by oral route  every day    Brilinta 90 mg tablet take 1 tablet by oral route 2 times every day    carvedilol 12.5 mg tablet take 1.5 tablet by oral route 2 times every day with food    metformin ER 750 mg tablet,extended release 24  hr take 1 tablet by oral route  every day with the evening meal      ALLERGIES: Ingredient Reaction Medication Name Comment LISINOPRIL     Reviewed, no changes.    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  Risks and benefits were discussed in detail with the patient and patient education was performed  Orders: Diagnostic Procedures: Assessment Procedure M54.16 Lumbar Spine- AP/Lat Instruction(s)/Education: Assessment Instruction I10 Lifestyle education  Completed Orders (this encounter) Order Details Reason Side Interpretation Result Initial Treatment Date Region Lifestyle education Patient will follow up with Primary Care Physician.        Assessment/Plan  # Detail Type Description  1. Assessment Low back pain, unspecified back pain laterality, with sciatica presence unspecified (M54.5).     2. Assessment Spinal stenosis, lumbar region with neurogenic claudication (M48.062).     3. Assessment Spondylolisthesis, lumbar region (M43.16).     4. Assessment Lumbar radiculopathy (M54.16).     5. Assessment Essential (primary) hypertension (I10).       Pain Management Plan Pain Scale: 8/10. Method: Numeric Pain Intensity Scale. Location: back. Onset: 03/30/2010. Duration: varies. Quality: discomforting. Pain management follow-up plan of care: Patient will continue medication management..              Provider:  Marchia Meiers. Vertell Limber MD  08/03/2019 12:47 PM    Dictation edited by: Marchia Meiers. Vertell Limber    CC Providers: [facility] [place],  [place]  [id number]-   Yvonne Lowne  [place] [address] [place], [place] [address]-               Electronically signed by Marchia Meiers Vertell Limber MD on 08/03/2019 12:47 PM

## 2019-09-03 NOTE — Anesthesia Preprocedure Evaluation (Addendum)
Anesthesia Evaluation  Patient identified by MRN, date of birth, ID band Patient awake    Reviewed: Allergy & Precautions, NPO status , Patient's Chart, lab work & pertinent test results  Airway Mallampati: I  TM Distance: >3 FB Neck ROM: Full    Dental   Pulmonary sleep apnea ,    Pulmonary exam normal        Cardiovascular hypertension, Pt. on medications + CAD  Normal cardiovascular exam     Neuro/Psych    GI/Hepatic GERD  Medicated and Controlled,  Endo/Other  diabetes  Renal/GU      Musculoskeletal   Abdominal   Peds  Hematology   Anesthesia Other Findings   Reproductive/Obstetrics                            Anesthesia Physical Anesthesia Plan  ASA: III  Anesthesia Plan: General   Post-op Pain Management:    Induction: Intravenous  PONV Risk Score and Plan: 2  Airway Management Planned: Oral ETT  Additional Equipment:   Intra-op Plan:   Post-operative Plan: Extubation in OR  Informed Consent: I have reviewed the patients History and Physical, chart, labs and discussed the procedure including the risks, benefits and alternatives for the proposed anesthesia with the patient or authorized representative who has indicated his/her understanding and acceptance.       Plan Discussed with: CRNA and Surgeon  Anesthesia Plan Comments: (PAT note by Karoline Caldwell, PA-C: Follows with cardiology for hx of CAD s/p PCI to mid LAD in 10/2015, hypertension, hyperlipidemia. Last seen by Dr. Saunders Revel 08/12/19. Per note, "Recent outside echo showed normal LVEF with grade 1 diastolic dysfunction, mild aortic regurgitation, and moderate tricuspid regurgitation.  PA pressure was mildly elevated.  While Mr. Tiegs may have a component of HFpEF, it is possible that leg edema may be due to continued amlodipine use.  As his BP has been well-controlled at home, I have instructed him to discontinue  amlodipine.  We will continue HCTZ for BP control and gentle diuresis... As Mr. Baumgardner is able to complete > 4 METS of activity without angina, I feel that it is reasonable to proceed with planned back surgery without additional testing or intervention.  Given history of PCI to the LAD, I suggest that aspirin 81 mg daily be continued during the perioperative period, if possible.  If it must be held due to risk for significant bleeding, it should be held 1 week before surgery and restarted as soon as possible from a post-op standpoint."  Admitted from 9/16-10/6 at [facility] for COVID-19 pneumonia with prolonged hypoxic respiratory failure. Treated with remdesivir, convalescent plasma.  He got initially 10 days of steroids but due to persistent dyspnea and hypoxia he was given a longer course of steroids. Discharged on prednisone 10 mg a day which he continued until 12/04/2018. He was on supplemental oxygen during the day which he has stopped.  He is still using 1 to 2 L at night. 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 Last seen by Dr. Vaughan Browner 06/24/19, continues to recover, cleared for back surgery.  Proep labs reviewed, unremarkable. DMII, A1c 6.5.  EKG 08/12/19: NSR. Rate 64.  PFTs  02/04/2019 FVC 1.60, FEV1 1.41, TLC 4.21 (69%), DLCO 13.26 (62%)  Cath and PCI 11/17/15: Conclusions: Significant single vessel coronary artery disease with diffusely diseased LAD with long segment of severe stenosis up to 95% in the mid section. Multiple small diagonal  branches with moderate to severe ostial disease. One tiny diagonal that was jailed by the stent demonstrates TIMI 2 flow at the end of the procedure. Given its small size and lack of chest pain, intervention was not attempted on this vessel. Moderate, nonobstructive disease involving the mid RCA. Successful PCI to mid LAD with placement of an integrity resolute 2.5 x 30 mm drug-eluting stent (post dilated at high  pressure with 2.75 mm [place] balloon) with 0% residual stenosis and TIMI-3 flow. Normal LV systolic function. Upper normal to mildly elevated left ventricular filling pressure (LVEDP 15-20 mmHg).  Recommendations: Dual antiplatelet therapy for at least 6 months, ideally longer. Patient was loaded with ticagrelor during the procedure but could be switched to clopidogrel at the time of discharge or outpatient follow-up. Aggressive secondary prevention. We will increase atorvastatin to 20 mg daily with plan to escalate further in the future. Follow-up as an outpatient in 2-3 weeks to ensure that the patient is doing well before his planned trip to [place] next month. )      Anesthesia Quick Evaluation

## 2019-09-03 NOTE — Progress Notes (Addendum)
Anesthesia Chart Review:  Follows with cardiology for hx of CAD s/p PCI to mid LAD in 10/2015, hypertension, hyperlipidemia. Last seen by Dr. Saunders Revel 08/12/19. Per note, "Recent outside echo showed normal LVEF with grade 1 diastolic dysfunction, mild aortic regurgitation, and moderate tricuspid regurgitation.  PA pressure was mildly elevated.  While Mr. Sudberry may have a component of HFpEF, it is possible that leg edema may be due to continued amlodipine use.  As his BP has been well-controlled at home, I have instructed him to discontinue amlodipine.  We will continue HCTZ for BP control and gentle diuresis... As Mr. Godman is able to complete > 4 METS of activity without angina, I feel that it is reasonable to proceed with planned back surgery without additional testing or intervention.  Given history of PCI to the LAD, I suggest that aspirin 81 mg daily be continued during the perioperative period, if possible.  If it must be held due to risk for significant bleeding, it should be held 1 week before surgery and restarted as soon as possible from a post-op standpoint."  Admitted from 9/16-10/6 at [facility] for COVID-19 pneumonia with prolonged hypoxic respiratory failure. Treated with remdesivir, convalescent plasma.  He got initially 10 days of steroids but due to persistent dyspnea and hypoxia he was given a longer course of steroids. Discharged on prednisone 10 mg a day which he continued until 12/04/2018. He was on supplemental oxygen during the day which he has stopped.  He is still using 1 to 2 L at night. 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 Last seen by Dr. Vaughan Browner 06/24/19, continues to recover, cleared for back surgery.  Proep labs reviewed, unremarkable. DMII, A1c 6.5.  EKG 08/12/19: NSR. Rate 64.  PFTs  02/04/2019 FVC 1.60, FEV1 1.41, TLC 4.21 [69%], DLCO 13.26 [62%]  Cath and PCI 11/17/15: Conclusions: 1. Significant single vessel coronary artery  disease with diffusely diseased LAD with long segment of severe stenosis up to 95% in the mid section. 2. Multiple small diagonal branches with moderate to severe ostial disease. One tiny diagonal that was jailed by the stent demonstrates TIMI 2 flow at the end of the procedure. Given its small size and lack of chest pain, intervention was not attempted on this vessel. 3. Moderate, nonobstructive disease involving the mid RCA. 4. Successful PCI to mid LAD with placement of an integrity resolute 2.5 x 30 mm drug-eluting stent (post dilated at high pressure with 2.75 mm [place] balloon) with 0% residual stenosis and TIMI-3 flow. 5. Normal LV systolic function. 6. Upper normal to mildly elevated left ventricular filling pressure (LVEDP 15-20 mmHg).  Recommendations: 1. Dual antiplatelet therapy for at least 6 months, ideally longer. Patient was loaded with ticagrelor during the procedure but could be switched to clopidogrel at the time of discharge or outpatient follow-up. 2. Aggressive secondary prevention. We will increase atorvastatin to 20 mg daily with plan to escalate further in the future. 3. Follow-up as an outpatient in 2-3 weeks to ensure that the patient is doing well before his planned trip to [place] next month.   Wynonia Musty [facility] Short Stay Center/Anesthesiology Phone [phone] 09/03/2019 2:07 PM

## 2019-09-07 ENCOUNTER — Other Ambulatory Visit (HOSPITAL_COMMUNITY)
Admission: RE | Admit: 2019-09-07 | Discharge: 2019-09-07 | Disposition: A | Discharge status: Home / Self Care | Payer: HMO | Source: Ambulatory Visit | Attending: Neurosurgery | Admitting: Neurosurgery

## 2019-09-07 DIAGNOSIS — Z01812 Encounter for preprocedural laboratory examination: Secondary | ICD-10-CM | POA: Insufficient documentation

## 2019-09-07 DIAGNOSIS — Z20822 Contact with and (suspected) exposure to covid-19: Secondary | ICD-10-CM | POA: Insufficient documentation

## 2019-09-07 LAB — SARS CORONAVIRUS 2 (TAT 6-24 HRS): SARS Coronavirus 2: NEGATIVE

## 2019-09-10 ENCOUNTER — Inpatient Hospital Stay (HOSPITAL_COMMUNITY): Payer: HMO | Admitting: Physician Assistant

## 2019-09-10 ENCOUNTER — Encounter (HOSPITAL_COMMUNITY)
Admission: RE | Disposition: A | Discharge status: Home / Self Care | Payer: Self-pay | Source: Home / Self Care | Attending: Neurosurgery

## 2019-09-10 ENCOUNTER — Inpatient Hospital Stay (HOSPITAL_COMMUNITY): Payer: HMO

## 2019-09-10 ENCOUNTER — Other Ambulatory Visit: Payer: Self-pay

## 2019-09-10 ENCOUNTER — Inpatient Hospital Stay (HOSPITAL_COMMUNITY): Payer: HMO | Admitting: Certified Registered Nurse Anesthetist

## 2019-09-10 ENCOUNTER — Inpatient Hospital Stay (HOSPITAL_COMMUNITY)
Admission: RE | Admit: 2019-09-10 | Discharge: 2019-09-11 | DRG: 455 | Disposition: A | Discharge status: Home / Self Care | Payer: HMO | Attending: Neurosurgery | Admitting: Neurosurgery

## 2019-09-10 ENCOUNTER — Encounter (HOSPITAL_COMMUNITY): Payer: Self-pay | Admitting: Neurosurgery

## 2019-09-10 DIAGNOSIS — I10 Essential (primary) hypertension: Secondary | ICD-10-CM | POA: Diagnosis present

## 2019-09-10 DIAGNOSIS — Z419 Encounter for procedure for purposes other than remedying health state, unspecified: Secondary | ICD-10-CM

## 2019-09-10 DIAGNOSIS — M4316 Spondylolisthesis, lumbar region: Principal | ICD-10-CM | POA: Diagnosis present

## 2019-09-10 DIAGNOSIS — M48062 Spinal stenosis, lumbar region with neurogenic claudication: Secondary | ICD-10-CM | POA: Diagnosis present

## 2019-09-10 DIAGNOSIS — Z955 Presence of coronary angioplasty implant and graft: Secondary | ICD-10-CM | POA: Diagnosis not present

## 2019-09-10 DIAGNOSIS — Z20822 Contact with and (suspected) exposure to covid-19: Secondary | ICD-10-CM | POA: Diagnosis present

## 2019-09-10 DIAGNOSIS — M5416 Radiculopathy, lumbar region: Secondary | ICD-10-CM | POA: Diagnosis present

## 2019-09-10 LAB — GLUCOSE, CAPILLARY
Glucose-Capillary: 130 mg/dL — ABNORMAL HIGH (ref 70–99)
Glucose-Capillary: 137 mg/dL — ABNORMAL HIGH (ref 70–99)
Glucose-Capillary: 265 mg/dL — ABNORMAL HIGH (ref 70–99)
Glucose-Capillary: 192 mg/dL — ABNORMAL HIGH (ref 70–99)

## 2019-09-10 SURGERY — POSTERIOR LUMBAR FUSION 1 LEVEL
Anesthesia: General | Site: Back

## 2019-09-10 MED ORDER — PHENYLEPHRINE 40 MCG/ML (10ML) SYRINGE FOR IV PUSH (FOR BLOOD PRESSURE SUPPORT)
PREFILLED_SYRINGE | INTRAVENOUS | Status: DC | PRN
  Administered 2019-09-10: 120 ug via INTRAVENOUS
  Administered 2019-09-10 (×3): 80 ug via INTRAVENOUS

## 2019-09-10 MED ORDER — BUPIVACAINE LIPOSOME 1.3 % IJ SUSP
INTRAMUSCULAR | Status: DC | PRN
  Administered 2019-09-10: 20 mL

## 2019-09-10 MED ORDER — ZOLPIDEM TARTRATE 5 MG PO TABS: 5.0000 mg | ORAL_TABLET | Freq: Every evening | ORAL | Status: DC | PRN

## 2019-09-10 MED ORDER — TURMERIC POWD: Status: DC

## 2019-09-10 MED ORDER — EPHEDRINE SULFATE-NACL 50-0.9 MG/10ML-% IV SOSY
PREFILLED_SYRINGE | INTRAVENOUS | Status: DC | PRN
  Administered 2019-09-10: 5 mg via INTRAVENOUS
  Administered 2019-09-10: 10 mg via INTRAVENOUS
  Administered 2019-09-10 (×3): 5 mg via INTRAVENOUS
  Administered 2019-09-10: 10 mg via INTRAVENOUS

## 2019-09-10 MED ORDER — CEFAZOLIN SODIUM-DEXTROSE 2-4 GM/100ML-% IV SOLN
2.0000 g | INTRAVENOUS | Status: AC
  Administered 2019-09-10: 2 g via INTRAVENOUS

## 2019-09-10 MED ORDER — BISACODYL 10 MG RE SUPP: 10.0000 mg | Freq: Every day | RECTAL | Status: DC | PRN

## 2019-09-10 MED ORDER — THROMBIN 20000 UNITS EX SOLR
CUTANEOUS | Status: DC | PRN
  Administered 2019-09-10: 20 mL

## 2019-09-10 MED ORDER — MENTHOL 3 MG MT LOZG: 1.0000 | LOZENGE | OROMUCOSAL | Status: DC | PRN

## 2019-09-10 MED ORDER — FENTANYL CITRATE (PF) 250 MCG/5ML IJ SOLN
INTRAMUSCULAR | Status: AC
  Filled 2019-09-10: qty 5

## 2019-09-10 MED ORDER — CHLORHEXIDINE GLUCONATE CLOTH 2 % EX PADS: 6.0000 | MEDICATED_PAD | Freq: Once | CUTANEOUS | Status: DC

## 2019-09-10 MED ORDER — ONDANSETRON HCL 4 MG/2ML IJ SOLN: 4.0000 mg | Freq: Once | INTRAMUSCULAR | Status: DC | PRN

## 2019-09-10 MED ORDER — METFORMIN HCL ER 750 MG PO TB24
750.0000 mg | ORAL_TABLET | Freq: Two times a day (BID) | ORAL | Status: DC
  Administered 2019-09-10 – 2019-09-11 (×2): 750 mg via ORAL
  Filled 2019-09-10 (×2): qty 1

## 2019-09-10 MED ORDER — CARVEDILOL 12.5 MG PO TABS
12.5000 mg | ORAL_TABLET | Freq: Two times a day (BID) | ORAL | Status: DC
  Administered 2019-09-10 – 2019-09-11 (×2): 12.5 mg via ORAL
  Filled 2019-09-10 (×2): qty 1

## 2019-09-10 MED ORDER — ORAL CARE MOUTH RINSE: 15.0000 mL | Freq: Once | OROMUCOSAL | Status: AC

## 2019-09-10 MED ORDER — ONDANSETRON HCL 4 MG PO TABS: 4.0000 mg | ORAL_TABLET | Freq: Four times a day (QID) | ORAL | Status: DC | PRN

## 2019-09-10 MED ORDER — HYDROCODONE-ACETAMINOPHEN 5-325 MG PO TABS: 2.0000 | ORAL_TABLET | ORAL | Status: DC | PRN

## 2019-09-10 MED ORDER — DOCUSATE SODIUM 100 MG PO CAPS
100.0000 mg | ORAL_CAPSULE | Freq: Two times a day (BID) | ORAL | Status: DC
  Administered 2019-09-10 – 2019-09-11 (×2): 100 mg via ORAL
  Filled 2019-09-10 (×2): qty 1

## 2019-09-10 MED ORDER — HYDROMORPHONE HCL 1 MG/ML IJ SOLN
INTRAMUSCULAR | Status: AC
  Filled 2019-09-10: qty 1

## 2019-09-10 MED ORDER — METHOCARBAMOL 500 MG PO TABS
500.0000 mg | ORAL_TABLET | Freq: Four times a day (QID) | ORAL | Status: DC | PRN
  Administered 2019-09-10 – 2019-09-11 (×3): 500 mg via ORAL
  Filled 2019-09-10 (×3): qty 1

## 2019-09-10 MED ORDER — OXYCODONE HCL 5 MG PO TABS
5.0000 mg | ORAL_TABLET | ORAL | Status: DC | PRN
  Administered 2019-09-10 – 2019-09-11 (×2): 5 mg via ORAL
  Filled 2019-09-10 (×3): qty 1

## 2019-09-10 MED ORDER — INSULIN ASPART 100 UNIT/ML ~~LOC~~ SOLN
0.0000 [IU] | Freq: Every day | SUBCUTANEOUS | Status: DC
  Administered 2019-09-10: 3 [IU] via SUBCUTANEOUS

## 2019-09-10 MED ORDER — BUPIVACAINE HCL (PF) 0.5 % IJ SOLN
INTRAMUSCULAR | Status: AC
  Filled 2019-09-10: qty 30

## 2019-09-10 MED ORDER — CEFAZOLIN SODIUM-DEXTROSE 2-4 GM/100ML-% IV SOLN
INTRAVENOUS | Status: AC
  Filled 2019-09-10: qty 100

## 2019-09-10 MED ORDER — CHLORHEXIDINE GLUCONATE 0.12 % MT SOLN: 15.0000 mL | Freq: Once | OROMUCOSAL | Status: AC

## 2019-09-10 MED ORDER — THROMBIN 20000 UNITS EX SOLR
CUTANEOUS | Status: AC
  Filled 2019-09-10: qty 20000

## 2019-09-10 MED ORDER — ASCORBIC ACID 500 MG PO TABS
500.0000 mg | ORAL_TABLET | Freq: Every day | ORAL | Status: DC
  Administered 2019-09-11: 500 mg via ORAL
  Filled 2019-09-10: qty 1

## 2019-09-10 MED ORDER — LIDOCAINE-EPINEPHRINE 1 %-1:100000 IJ SOLN
INTRAMUSCULAR | Status: AC
  Filled 2019-09-10: qty 1

## 2019-09-10 MED ORDER — ASPIRIN EC 81 MG PO TBEC
162.0000 mg | DELAYED_RELEASE_TABLET | Freq: Every day | ORAL | Status: DC
  Administered 2019-09-11: 162 mg via ORAL
  Filled 2019-09-10: qty 2

## 2019-09-10 MED ORDER — DEXAMETHASONE SODIUM PHOSPHATE 10 MG/ML IJ SOLN
INTRAMUSCULAR | Status: DC | PRN
  Administered 2019-09-10: 4 mg via INTRAVENOUS

## 2019-09-10 MED ORDER — BUPIVACAINE LIPOSOME 1.3 % IJ SUSP
20.0000 mL | Freq: Once | INTRAMUSCULAR | Status: DC
  Filled 2019-09-10: qty 20

## 2019-09-10 MED ORDER — FENTANYL CITRATE (PF) 250 MCG/5ML IJ SOLN
INTRAMUSCULAR | Status: DC | PRN
  Administered 2019-09-10: 150 ug via INTRAVENOUS
  Administered 2019-09-10: 25 ug via INTRAVENOUS
  Administered 2019-09-10: 50 ug via INTRAVENOUS
  Administered 2019-09-10: 25 ug via INTRAVENOUS

## 2019-09-10 MED ORDER — ACETAMINOPHEN 325 MG PO TABS
ORAL_TABLET | ORAL | Status: DC | PRN
  Administered 2019-09-10: 1000 mg via ORAL

## 2019-09-10 MED ORDER — LIDOCAINE 2% (20 MG/ML) 5 ML SYRINGE
INTRAMUSCULAR | Status: DC | PRN
  Administered 2019-09-10: 100 mg via INTRAVENOUS

## 2019-09-10 MED ORDER — PHENOL 1.4 % MT LIQD: 1.0000 | OROMUCOSAL | Status: DC | PRN

## 2019-09-10 MED ORDER — ACETAMINOPHEN 325 MG PO TABS: 650.0000 mg | ORAL_TABLET | ORAL | Status: DC | PRN

## 2019-09-10 MED ORDER — ONDANSETRON HCL 4 MG/2ML IJ SOLN
INTRAMUSCULAR | Status: AC
  Filled 2019-09-10: qty 2

## 2019-09-10 MED ORDER — CEFAZOLIN SODIUM-DEXTROSE 2-4 GM/100ML-% IV SOLN
2.0000 g | Freq: Three times a day (TID) | INTRAVENOUS | Status: AC
  Administered 2019-09-10 (×2): 2 g via INTRAVENOUS
  Filled 2019-09-10 (×2): qty 100

## 2019-09-10 MED ORDER — ACETAMINOPHEN 500 MG PO TABS
ORAL_TABLET | ORAL | Status: AC
  Filled 2019-09-10: qty 2

## 2019-09-10 MED ORDER — METHOCARBAMOL 1000 MG/10ML IJ SOLN
500.0000 mg | Freq: Four times a day (QID) | INTRAVENOUS | Status: DC | PRN
  Filled 2019-09-10: qty 5

## 2019-09-10 MED ORDER — HYDROMORPHONE HCL 1 MG/ML IJ SOLN
0.2500 mg | INTRAMUSCULAR | Status: DC | PRN
  Administered 2019-09-10: 0.5 mg via INTRAVENOUS

## 2019-09-10 MED ORDER — INSULIN ASPART 100 UNIT/ML ~~LOC~~ SOLN
0.0000 [IU] | Freq: Three times a day (TID) | SUBCUTANEOUS | Status: DC
  Administered 2019-09-10: 8 [IU] via SUBCUTANEOUS
  Administered 2019-09-11: 2 [IU] via SUBCUTANEOUS

## 2019-09-10 MED ORDER — LIDOCAINE 2% (20 MG/ML) 5 ML SYRINGE
INTRAMUSCULAR | Status: AC
  Filled 2019-09-10: qty 5

## 2019-09-10 MED ORDER — PANTOPRAZOLE SODIUM 40 MG IV SOLR: 40.0000 mg | Freq: Every day | INTRAVENOUS | Status: DC

## 2019-09-10 MED ORDER — ATORVASTATIN CALCIUM 10 MG PO TABS
10.0000 mg | ORAL_TABLET | Freq: Every day | ORAL | Status: DC
  Administered 2019-09-10: 10 mg via ORAL
  Filled 2019-09-10 (×2): qty 1

## 2019-09-10 MED ORDER — CHLORHEXIDINE GLUCONATE 0.12 % MT SOLN
OROMUCOSAL | Status: AC
  Administered 2019-09-10: 15 mL via OROMUCOSAL
  Filled 2019-09-10: qty 15

## 2019-09-10 MED ORDER — THROMBIN 5000 UNITS EX SOLR
OROMUCOSAL | Status: DC | PRN
  Administered 2019-09-10: 5 mL

## 2019-09-10 MED ORDER — HYDROMORPHONE HCL 1 MG/ML IJ SOLN: 0.5000 mg | INTRAMUSCULAR | Status: DC | PRN

## 2019-09-10 MED ORDER — MEPERIDINE HCL 25 MG/ML IJ SOLN: 6.2500 mg | INTRAMUSCULAR | Status: DC | PRN

## 2019-09-10 MED ORDER — POLYETHYLENE GLYCOL 3350 17 G PO PACK: 17.0000 g | PACK | Freq: Every day | ORAL | Status: DC | PRN

## 2019-09-10 MED ORDER — ALUM & MAG HYDROXIDE-SIMETH 200-200-20 MG/5ML PO SUSP: 30.0000 mL | Freq: Four times a day (QID) | ORAL | Status: DC | PRN

## 2019-09-10 MED ORDER — LACTATED RINGERS IV SOLN: INTRAVENOUS | Status: DC

## 2019-09-10 MED ORDER — CINNAMON 500 MG PO CAPS: ORAL_CAPSULE | ORAL | Status: DC

## 2019-09-10 MED ORDER — KCL IN DEXTROSE-NACL 20-5-0.45 MEQ/L-%-% IV SOLN: INTRAVENOUS | Status: DC

## 2019-09-10 MED ORDER — PANTOPRAZOLE SODIUM 40 MG PO TBEC
40.0000 mg | DELAYED_RELEASE_TABLET | Freq: Every day | ORAL | Status: DC
  Administered 2019-09-10: 40 mg via ORAL
  Filled 2019-09-10: qty 1

## 2019-09-10 MED ORDER — ONDANSETRON HCL 4 MG/2ML IJ SOLN
INTRAMUSCULAR | Status: DC | PRN
  Administered 2019-09-10: 4 mg via INTRAVENOUS

## 2019-09-10 MED ORDER — PROPOFOL 10 MG/ML IV BOLUS
INTRAVENOUS | Status: DC | PRN
  Administered 2019-09-10: 120 mg via INTRAVENOUS

## 2019-09-10 MED ORDER — ACETAMINOPHEN 650 MG RE SUPP: 650.0000 mg | RECTAL | Status: DC | PRN

## 2019-09-10 MED ORDER — FLEET ENEMA 7-19 GM/118ML RE ENEM: 1.0000 | ENEMA | Freq: Once | RECTAL | Status: DC | PRN

## 2019-09-10 MED ORDER — SODIUM CHLORIDE 0.9% FLUSH
3.0000 mL | Freq: Two times a day (BID) | INTRAVENOUS | Status: DC
  Administered 2019-09-10: 3 mL via INTRAVENOUS

## 2019-09-10 MED ORDER — ARTIFICIAL TEARS OPHTHALMIC OINT
TOPICAL_OINTMENT | OPHTHALMIC | Status: DC | PRN
  Administered 2019-09-10: 1 via OPHTHALMIC

## 2019-09-10 MED ORDER — HYDROCHLOROTHIAZIDE 25 MG PO TABS
25.0000 mg | ORAL_TABLET | Freq: Every morning | ORAL | Status: DC
  Administered 2019-09-11: 25 mg via ORAL
  Filled 2019-09-10: qty 1

## 2019-09-10 MED ORDER — LIDOCAINE-EPINEPHRINE 1 %-1:100000 IJ SOLN
INTRAMUSCULAR | Status: DC | PRN
  Administered 2019-09-10: 5 mL

## 2019-09-10 MED ORDER — 0.9 % SODIUM CHLORIDE (POUR BTL) OPTIME
TOPICAL | Status: DC | PRN
  Administered 2019-09-10: 1000 mL

## 2019-09-10 MED ORDER — SODIUM CHLORIDE 0.9% FLUSH: 3.0000 mL | INTRAVENOUS | Status: DC | PRN

## 2019-09-10 MED ORDER — SODIUM CHLORIDE 0.9 % IV SOLN: 250.0000 mL | INTRAVENOUS | Status: DC

## 2019-09-10 MED ORDER — ROCURONIUM BROMIDE 10 MG/ML (PF) SYRINGE
PREFILLED_SYRINGE | INTRAVENOUS | Status: DC | PRN
  Administered 2019-09-10: 20 mg via INTRAVENOUS
  Administered 2019-09-10: 70 mg via INTRAVENOUS

## 2019-09-10 MED ORDER — DEXAMETHASONE SODIUM PHOSPHATE 10 MG/ML IJ SOLN
INTRAMUSCULAR | Status: AC
  Filled 2019-09-10: qty 1

## 2019-09-10 MED ORDER — ONDANSETRON HCL 4 MG/2ML IJ SOLN: 4.0000 mg | Freq: Four times a day (QID) | INTRAMUSCULAR | Status: DC | PRN

## 2019-09-10 MED ORDER — BUPIVACAINE HCL (PF) 0.5 % IJ SOLN
INTRAMUSCULAR | Status: DC | PRN
  Administered 2019-09-10: 5 mL

## 2019-09-10 MED ORDER — TAMSULOSIN HCL 0.4 MG PO CAPS
0.4000 mg | ORAL_CAPSULE | Freq: Every day | ORAL | Status: DC
  Administered 2019-09-10: 0.4 mg via ORAL
  Filled 2019-09-10: qty 1

## 2019-09-10 MED ORDER — PROPOFOL 10 MG/ML IV BOLUS
INTRAVENOUS | Status: AC
  Filled 2019-09-10: qty 20

## 2019-09-10 MED ORDER — THROMBIN 5000 UNITS EX SOLR
CUTANEOUS | Status: AC
  Filled 2019-09-10: qty 5000

## 2019-09-10 MED ORDER — SUGAMMADEX SODIUM 200 MG/2ML IV SOLN
INTRAVENOUS | Status: DC | PRN
  Administered 2019-09-10: 200 mg via INTRAVENOUS

## 2019-09-10 SURGICAL SUPPLY — 77 items
BASKET BONE COLLECTION (BASKET) ×2 IMPLANT
BENZOIN TINCTURE PRP APPL 2/3 (GAUZE/BANDAGES/DRESSINGS) ×2 IMPLANT
BLADE CLIPPER SURG (BLADE) IMPLANT
BONE CANC CHIPS 20CC PCAN1/4 (Bone Implant) ×2 IMPLANT
BUR MATCHSTICK NEURO 3.0 LAGG (BURR) ×2 IMPLANT
BUR PRECISION FLUTE 5.0 (BURR) ×2 IMPLANT
CAGE COROENT LG 10X9X23-12 (Cage) ×4 IMPLANT
CANISTER SUCT 3000ML PPV (MISCELLANEOUS) ×2 IMPLANT
CARTRIDGE OIL MAESTRO DRILL (MISCELLANEOUS) ×1 IMPLANT
CHIPS CANC BONE 20CC PCAN1/4 (Bone Implant) ×1 IMPLANT
CNTNR URN SCR LID CUP LEK RST (MISCELLANEOUS) ×1 IMPLANT
CONT SPEC 4OZ STRL OR WHT (MISCELLANEOUS) ×2
COVER BACK TABLE 60X90IN (DRAPES) ×2 IMPLANT
COVER WAND RF STERILE (DRAPES) ×2 IMPLANT
DECANTER SPIKE VIAL GLASS SM (MISCELLANEOUS) ×2 IMPLANT
DERMABOND ADVANCED (GAUZE/BANDAGES/DRESSINGS) ×1
DERMABOND ADVANCED .7 DNX12 (GAUZE/BANDAGES/DRESSINGS) ×1 IMPLANT
DIFFUSER DRILL AIR PNEUMATIC (MISCELLANEOUS) ×2 IMPLANT
DRAPE C-ARM 42X72 X-RAY (DRAPES) ×2 IMPLANT
DRAPE C-ARMOR (DRAPES) ×2 IMPLANT
DRAPE LAPAROTOMY 100X72X124 (DRAPES) ×2 IMPLANT
DRAPE SURG 17X23 STRL (DRAPES) ×2 IMPLANT
DRSG OPSITE POSTOP 4X6 (GAUZE/BANDAGES/DRESSINGS) ×2 IMPLANT
DURAPREP 26ML APPLICATOR (WOUND CARE) ×2 IMPLANT
ELECT BLADE 4.0 EZ CLEAN MEGAD (MISCELLANEOUS) ×2
ELECT REM PT RETURN 9FT ADLT (ELECTROSURGICAL) ×2
ELECTRODE BLDE 4.0 EZ CLN MEGD (MISCELLANEOUS) ×1 IMPLANT
ELECTRODE REM PT RTRN 9FT ADLT (ELECTROSURGICAL) ×1 IMPLANT
EVACUATOR 1/8 PVC DRAIN (DRAIN) IMPLANT
GAUZE 4X4 16PLY RFD (DISPOSABLE) IMPLANT
GAUZE SPONGE 4X4 12PLY STRL (GAUZE/BANDAGES/DRESSINGS) ×2 IMPLANT
GLOVE BIO SURGEON STRL SZ8 (GLOVE) ×4 IMPLANT
GLOVE BIOGEL PI IND STRL 8 (GLOVE) ×2 IMPLANT
GLOVE BIOGEL PI IND STRL 8.5 (GLOVE) ×2 IMPLANT
GLOVE BIOGEL PI INDICATOR 8 (GLOVE) ×2
GLOVE BIOGEL PI INDICATOR 8.5 (GLOVE) ×2
GLOVE ECLIPSE 8.0 STRL XLNG CF (GLOVE) ×4 IMPLANT
GLOVE EXAM NITRILE XL STR (GLOVE) IMPLANT
GOWN STRL REUS W/ TWL LRG LVL3 (GOWN DISPOSABLE) IMPLANT
GOWN STRL REUS W/ TWL XL LVL3 (GOWN DISPOSABLE) ×2 IMPLANT
GOWN STRL REUS W/TWL 2XL LVL3 (GOWN DISPOSABLE) ×4 IMPLANT
GOWN STRL REUS W/TWL LRG LVL3 (GOWN DISPOSABLE)
GOWN STRL REUS W/TWL XL LVL3 (GOWN DISPOSABLE) ×4
HEMOSTAT POWDER KIT SURGIFOAM (HEMOSTASIS) ×2 IMPLANT
KIT BASIN OR (CUSTOM PROCEDURE TRAY) ×2 IMPLANT
KIT INFUSE XX SMALL 0.7CC (Orthopedic Implant) ×2 IMPLANT
KIT POSITION SURG JACKSON T1 (MISCELLANEOUS) ×2 IMPLANT
KIT TURNOVER KIT B (KITS) ×2 IMPLANT
MILL MEDIUM DISP (BLADE) IMPLANT
NEEDLE HYPO 21X1.5 SAFETY (NEEDLE) ×2 IMPLANT
NEEDLE HYPO 25X1 1.5 SAFETY (NEEDLE) ×2 IMPLANT
NEEDLE SPNL 18GX3.5 QUINCKE PK (NEEDLE) IMPLANT
NS IRRIG 1000ML POUR BTL (IV SOLUTION) ×2 IMPLANT
OIL CARTRIDGE MAESTRO DRILL (MISCELLANEOUS) ×2
PACK LAMINECTOMY NEURO (CUSTOM PROCEDURE TRAY) ×2 IMPLANT
PAD ARMBOARD 7.5X6 YLW CONV (MISCELLANEOUS) ×6 IMPLANT
PATTIES SURGICAL .5 X.5 (GAUZE/BANDAGES/DRESSINGS) IMPLANT
PATTIES SURGICAL .5 X1 (DISPOSABLE) IMPLANT
PATTIES SURGICAL 1X1 (DISPOSABLE) IMPLANT
ROD RELINE-O LORD 5.5X35MM (Rod) ×2 IMPLANT
ROD RELINE-O LORD 5.5X40 (Rod) ×2 IMPLANT
SCREW LOCK RELINE 5.5 TULIP (Screw) ×8 IMPLANT
SCREW RELINE-O POLY 6.5X40 (Screw) ×8 IMPLANT
SPONGE LAP 4X18 RFD (DISPOSABLE) IMPLANT
SPONGE SURGIFOAM ABS GEL 100 (HEMOSTASIS) IMPLANT
STAPLER SKIN PROX WIDE 3.9 (STAPLE) IMPLANT
STRIP CLOSURE SKIN 1/2X4 (GAUZE/BANDAGES/DRESSINGS) ×2 IMPLANT
SUT VIC AB 1 CT1 18XBRD ANBCTR (SUTURE) ×2 IMPLANT
SUT VIC AB 1 CT1 8-18 (SUTURE) ×4
SUT VIC AB 2-0 CT1 18 (SUTURE) ×4 IMPLANT
SUT VIC AB 3-0 SH 8-18 (SUTURE) ×4 IMPLANT
SYR 20ML LL LF (SYRINGE) ×2 IMPLANT
SYR 5ML LL (SYRINGE) IMPLANT
TOWEL GREEN STERILE (TOWEL DISPOSABLE) ×2 IMPLANT
TOWEL GREEN STERILE FF (TOWEL DISPOSABLE) ×2 IMPLANT
TRAY FOLEY MTR SLVR 16FR STAT (SET/KITS/TRAYS/PACK) ×2 IMPLANT
WATER STERILE IRR 1000ML POUR (IV SOLUTION) ×2 IMPLANT

## 2019-09-10 NOTE — Anesthesia Procedure Notes (Signed)
Procedure Name: Intubation Performed by: Rodrigues Urbanek H, CRNA Pre-anesthesia Checklist: Patient identified, Emergency Drugs available, Suction available and Patient being monitored Patient Re-evaluated:Patient Re-evaluated prior to induction Oxygen Delivery Method: Circle System Utilized Preoxygenation: Pre-oxygenation with 100% oxygen Induction Type: IV induction Ventilation: Mask ventilation without difficulty and Oral airway inserted - appropriate to patient size Laryngoscope Size: Miller and 2 Grade View: Grade I Tube type: Oral Tube size: 7.0 mm Number of attempts: 1 Airway Equipment and Method: Stylet and Oral airway Placement Confirmation: ETT inserted through vocal cords under direct vision,  positive ETCO2 and breath sounds checked- equal and bilateral Secured at: 22 cm Tube secured with: Tape Dental Injury: Teeth and Oropharynx as per pre-operative assessment        

## 2019-09-10 NOTE — Evaluation (Signed)
Physical Therapy Evaluation Patient Details Name: Eddie Mcdaniel MRN: [id number] DOB: 07-Sep-1940 Today's Date: 09/10/2019   History of Present Illness  The pt is a 79 yo male presenting s/p PLIF L4-5 due to ongoing pain and tingling into RLE. PMH includes: HTN, CAD, sleep apnea, DM, GERD, and hospitalization for 20 days in 2020.  Clinical Impression  Pt in bed upon arrival of PT, agreeable to evaluation at this time. Prior to admission the pt was independent with mobility and ADLs but increasingly limited by pain. Prior to recent progression of the pain he was exercising 7 days/week, walking 3 miles/day and able to vacuum the entire house without issue. The pt now presents with minor limitations in functional mobility, dynamic stability, and activity tolerance due to above dx, and will continue to benefit from skilled PT acutely to address these deficits but is safe to d/c home with assist of his wife when medically cleared. The pt was able to demo good stability with hallway ambulation today, initially using the IV pole for balance and progressing to no AD. The pt and his wife were educated on bed mobility and other self-care tasks while maintaining his spinal precautions and both verbalized agreement. The pt also completed navigation of 1 step x5, but did need to use a single rail to maintain balance, which he will not have at home. The pt will continue to benefit from acute PT to progress functional strength and stability prior to d/c home.       Follow Up Recommendations No PT follow up;Supervision for mobility/OOB    Equipment Recommendations  None recommended by PT    Recommendations for Other Services       Precautions / Restrictions Precautions Precautions: Fall;Back Precaution Booklet Issued: Yes (comment) Precaution Comments: pt able to verbalize precautions at end of session Required Braces or Orthoses: Spinal Brace Spinal Brace: Lumbar corset;Applied in sitting  position Restrictions Weight Bearing Restrictions: No      Mobility  Bed Mobility Overal bed mobility: Needs Assistance Bed Mobility: Supine to Sit     Supine to sit: Supervision     General bed mobility comments: VC for log roll technique  Transfers Overall transfer level: Needs assistance Equipment used: None Transfers: Sit to/from Stand Sit to Stand: Supervision         General transfer comment: supervision for safety, pt able to rise and steady without UE support or assist  Ambulation/Gait Ambulation/Gait assistance: Supervision Gait Distance (Feet): 500 Feet Assistive device: IV Pole;None Gait Pattern/deviations: Step-through pattern;Decreased stride length;Decreased step length - left Gait velocity: 0.38 m/s Gait velocity interpretation: <1.31 ft/sec, indicative of household ambulator General Gait Details: pt with slightly reduced L-step length and slpwed gait. initially pushing IV pole, but was able to progress to no AD and no change in stability without UE support  Stairs Stairs: Yes Stairs assistance: Supervision Stair Management: One rail Right;Step to pattern;Forwards Number of Stairs: 1 (x 5) General stair comments: pt completed navigation of 1 step x5 with use of R rail. Pt reports slight increase in back pain with stairs, but had no LOB  Wheelchair Mobility    Modified Rankin (Stroke Patients Only)       Balance Overall balance assessment: Mild deficits observed, not formally tested  Pertinent Vitals/Pain Pain Assessment: Faces Faces Pain Scale: Hurts a little bit Pain Location: low back with bed mobility Pain Descriptors / Indicators: Grimacing Pain Intervention(s): Limited activity within patient's tolerance;Monitored during session    Home Living Family/patient expects to be discharged to:: Private residence Living Arrangements: Spouse/significant other Available Help at  Discharge: Family;Available 24 hours/day Type of Home: House Home Access: Stairs to enter Entrance Stairs-Rails: None Entrance Stairs-Number of Steps: 3 Home Layout: One level Home Equipment: Walker - 4 wheels;Grab bars - tub/shower      Prior Function Level of Independence: Independent         Comments: pt exercising multiple days/wk until reccently due to progression of numbness and tingling throughout BLE. Independent with ADLs. Reports his exercise included walking 3 miles/day     Hand Dominance   Dominant Hand: Right    Extremity/Trunk Assessment   Upper Extremity Assessment Upper Extremity Assessment: Overall WFL for tasks assessed    Lower Extremity Assessment Lower Extremity Assessment: Overall WFL for tasks assessed (pt denies any pain in either extremity, reports no difference in senstion, no numbness or tingling)    Cervical / Trunk Assessment Cervical / Trunk Assessment:  (s/p lumbar spine surgery)  Communication   Communication: HOH  Cognition Arousal/Alertness: Awake/alert Behavior During Therapy: WFL for tasks assessed/performed Overall Cognitive Status: Within Functional Limits for tasks assessed                                               Assessment/Plan    PT Assessment Patient needs continued PT services  PT Problem List Decreased mobility;Decreased strength;Decreased activity tolerance;Decreased balance       PT Treatment Interventions Therapeutic exercise;Gait training;Balance training;Stair training;Functional mobility training;Therapeutic activities;Patient/family education    PT Goals (Current goals can be found in the Care Plan section)  Acute Rehab PT Goals Patient Stated Goal: return to walking 3 miles/day PT Goal Formulation: With patient Time For Goal Achievement: 09/24/19 Potential to Achieve Goals: Good    Frequency Min 5X/week    AM-PAC PT "6 Clicks" Mobility  Outcome Measure Help needed turning from  your back to your side while in a flat bed without using bedrails?: A Little Help needed moving from lying on your back to sitting on the side of a flat bed without using bedrails?: A Little Help needed moving to and from a bed to a chair (including a wheelchair)?: None Help needed standing up from a chair using your arms (e.g., wheelchair or bedside chair)?: A Little Help needed to walk in hospital room?: A Little Help needed climbing 3-5 steps with a railing? : A Little 6 Click Score: 19    End of Session Equipment Utilized During Treatment: Gait belt;Back brace Activity Tolerance: Patient tolerated treatment well Patient left: in bed;with call bell/phone within reach;with family/visitor present (pt sitting EOB) Nurse Communication: Mobility status PT Visit Diagnosis: Difficulty in walking, not elsewhere classified (R26.2);Pain Pain - part of body:  (low back)    Time: 4650-3546 PT Time Calculation (min) (ACUTE ONLY): 31 min   Charges:   PT Evaluation $PT Eval Low Complexity: 1 Low PT Treatments $Gait Training: 8-22 mins        Karma Ganja, PT, DPT   Acute Rehabilitation Department Pager #: [phone]  Otho Bellows 09/10/2019, 5:37 PM

## 2019-09-10 NOTE — Progress Notes (Signed)
Orthopedic Tech Progress Note Patient Details:  Eddie Mcdaniel [birth year] [id number] Patient has brace. Patient ID: Eddie Mcdaniel, male   DOB: [birth year], 79 y.o.   MRN: [id number]   [name] 09/10/2019, 1:25 PM

## 2019-09-10 NOTE — Brief Op Note (Addendum)
09/10/2019  10:18 AM  PATIENT:  Eddie Mcdaniel  79 y.o. 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, lumbago, radiculopathy L 45 level  PROCEDURE:  Procedure(s) with comments: POSTERIOR LUMBAR INTERBODY FUSION LUMBAR FOUR- LUMBAR FIVE (N/A) - POSTERIOR LUMBAR INTERBODY FUSION LUMBAR FOUR- LUMBAR FIVE with PEEK cages, autograft, pedicle screw fixation, posterolateral arthrodesis with autograft and allograft  SURGEON:  Surgeon(s) and Role:    Erline Levine, MD - Primary  PHYSICIAN ASSISTANT: Glenford Peers, NP  ASSISTANTS: Poteat, RN   ANESTHESIA:   general  EBL:  150 mL   BLOOD ADMINISTERED:none  DRAINS: none   LOCAL MEDICATIONS USED:  MARCAINE    and LIDOCAINE   SPECIMEN:  No Specimen  DISPOSITION OF SPECIMEN:  N/A  COUNTS:  YES  TOURNIQUET:  * No tourniquets in log *  DICTATION: Patient is 79 year old man with 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   It was elected to take him to surgery for decompression and fusion at this level.   Procedure: Patient was placed in a prone position on the [place] table after smooth and uncomplicated induction of general endotracheal anesthesia. Area of planned incision was marked with C arm.  His low back was prepped and draped in usual sterile fashion with betadine scrub and DuraPrep. Area of incision was infiltrated with local lidocaine. Incision was made to the lumbodorsal fascia was incised and exposure was performed of the L4 through L5 spinous processes laminae facet joint and transverse processes. Intraoperative x-ray was obtained which confirmed correct orientation. A total laminectomy of L4 was performed with disarticulation of the facet joints at  this level and thorough decompression was performed of both L4 and L5 nerve roots along with the common dural tube. This decompression was more involved than would be typical of that performed for PLIF alone and included painstaking dissection of adherent ligament compressing the thecal sac and wide decompression of all neural elements. A thorough discectomy was initially performed on the left with preparation of the endplates for grafting a trial spacer was placed this level and a thorough discectomy was performed on the right as well.  Bilateral 10 x 9 x 23 mm x 12 degree peek cages were packed with extra extra small BMP and autograft and was inserted the interspace and countersunk appropriately along with 10 cc of morselized bone autograft. The posterolateral region was extensively decorticated and pedicle probes were placed at L4 and L5 bilaterally. Intraoperative fluoroscopy confirmed correct orientationin the AP and lateral plane. 40 x 6.5 mm pedicle screws were placed at L5 bilaterally and 40 x 6.5 mm screws placed at L4 bilaterally final x-rays demonstrated well-positioned interbody grafts and pedicle screw fixation. A 35 mm lordotic rod was placed on the left and a 40 mm rod was placed on the right locked down in situ and the posterolateral region was packed with the remaining autograft and allograft bilaterally (20 cc on the right and 10 cc on the left).  Long-acting Marcaine was injected in the deep musculature.  Fascia was closed with 1 Vicryl sutures skin edges were reapproximated 2 and 3-0 Vicryl sutures. The wound is dressed with Dermabond and an occlusive dressing.  The patient was extubated in the operating room and taken to recovery in stable satisfactory condition. She tolerated  the operation well counts were correct at the end of the case.   PLAN OF CARE: Admit to inpatient   PATIENT DISPOSITION:  PACU - hemodynamically stable.   Delay start of Pharmacological VTE agent (>24hrs) due to  surgical blood loss or risk of bleeding: yes

## 2019-09-10 NOTE — Op Note (Addendum)
09/10/2019  10:18 AM  PATIENT:  Eddie Mcdaniel  79 y.o. 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, lumbago, radiculopathy L 45 level  PROCEDURE:  Procedure(s) with comments: POSTERIOR LUMBAR INTERBODY FUSION LUMBAR FOUR- LUMBAR FIVE (N/A) - POSTERIOR LUMBAR INTERBODY FUSION LUMBAR FOUR- LUMBAR FIVE with PEEK cages, autograft, pedicle screw fixation, posterolateral arthrodesis with autograft and allograft  SURGEON:  Surgeon(s) and Role:    Erline Levine, MD - Primary  PHYSICIAN ASSISTANT: Glenford Peers, NP  ASSISTANTS: Poteat, RN   ANESTHESIA:   general  EBL:  150 mL   BLOOD ADMINISTERED:none  DRAINS: none   LOCAL MEDICATIONS USED:  MARCAINE    and LIDOCAINE   SPECIMEN:  No Specimen  DISPOSITION OF SPECIMEN:  N/A  COUNTS:  YES  TOURNIQUET:  * No tourniquets in log *  DICTATION: Patient is 79 year old man with 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   It was elected to take him to surgery for decompression and fusion at this level.   Procedure: Patient was placed in a prone position on the [place] table after smooth and uncomplicated induction of general endotracheal anesthesia. Area of planned incision was marked with C arm.  His low back was prepped and draped in usual sterile fashion with betadine scrub and DuraPrep. Area of incision was infiltrated with local lidocaine. Incision was made to the lumbodorsal fascia was incised and exposure was performed of the L4 through L5 spinous processes laminae facet joint and transverse processes. Intraoperative x-ray was obtained which confirmed correct orientation. A total laminectomy of L4 was performed with disarticulation of the facet joints at  this level and thorough decompression was performed of both L4 and L5 nerve roots along with the common dural tube. This decompression was more involved than would be typical of that performed for PLIF alone and included painstaking dissection of adherent ligament compressing the thecal sac and wide decompression of all neural elements. A thorough discectomy was initially performed on the left with preparation of the endplates for grafting a trial spacer was placed this level and a thorough discectomy was performed on the right as well.  Bilateral 10 x 9 x 23 mm x 12 degree peek cages were packed with extra extra small BMP and autograft and was inserted the interspace and countersunk appropriately along with 10 cc of morselized bone autograft. The posterolateral region was extensively decorticated and pedicle probes were placed at L4 and L5 bilaterally. Intraoperative fluoroscopy confirmed correct orientationin the AP and lateral plane. 40 x 6.5 mm pedicle screws were placed at L5 bilaterally and 40 x 6.5 mm screws placed at L4 bilaterally final x-rays demonstrated well-positioned interbody grafts and pedicle screw fixation. A 35 mm lordotic rod was placed on the left and a 40 mm rod was placed on the right locked down in situ and the posterolateral region was packed with the remaining autograft and allograft bilaterally (20 cc on the right and 10 cc on the left). Long-acting Marcaine was injected in the deep musculature.  Fascia was closed with 1 Vicryl sutures skin edges were reapproximated 2 and 3-0 Vicryl sutures. The wound is dressed with Dermabond and an occlusive dressing.  The patient was extubated in the operating room and taken to recovery in stable satisfactory condition. She tolerated the  operation well counts were correct at the end of the case.   PLAN OF CARE: Admit to inpatient   PATIENT DISPOSITION:  PACU - hemodynamically stable.   Delay start of Pharmacological VTE agent (>24hrs) due to  surgical blood loss or risk of bleeding: yes

## 2019-09-10 NOTE — Transfer of Care (Signed)
Immediate Anesthesia Transfer of Care Note  Patient: Eddie Mcdaniel  Procedure(s) Performed: POSTERIOR LUMBAR INTERBODY FUSION LUMBAR FOUR- LUMBAR FIVE (N/A Back)  Patient Location: PACU  Anesthesia Type:General  Level of Consciousness: drowsy  Airway & Oxygen Therapy: Patient Spontanous Breathing  Post-op Assessment: Report given to RN and Post -op Vital signs reviewed and stable  Post vital signs: Reviewed and stable  Last Vitals:  Vitals Value Taken Time  BP 152/72 09/10/19 1019  Temp    Pulse 76 09/10/19 1021  Resp 13 09/10/19 1021  SpO2 97 % 09/10/19 1021  Vitals shown include unvalidated device data.  Last Pain:  Vitals:   09/10/19 0614  TempSrc: Temporal  PainSc: 8       Patients Stated Pain Goal: 2 (22/29/79 8921)  Complications: No complications documented.

## 2019-09-10 NOTE — Interval H&P Note (Signed)
History and Physical Interval Note:  09/10/2019 7:33 AM  Eddie Mcdaniel  has presented today for surgery, with the diagnosis of SPINAL STENOSIS, LUMBAR REGION WITH NEUROGENIC CLAUDICATION.  The various methods of treatment have been discussed with the patient and family. After consideration of risks, benefits and other options for treatment, the patient has consented to  Procedure(s): POSTERIOR LUMBAR INTERBODY FUSION LUMBAR 4- LUMBAR 5 (N/A) as a surgical intervention.  The patient's history has been reviewed, patient examined, no change in status, stable for surgery.  I have reviewed the patient's chart and labs.  Questions were answered to the patient's satisfaction.     Peggyann Shoals

## 2019-09-10 NOTE — Anesthesia Postprocedure Evaluation (Signed)
Anesthesia Post Note  Patient: Eddie Mcdaniel  Procedure(s) Performed: POSTERIOR LUMBAR INTERBODY FUSION LUMBAR FOUR- LUMBAR FIVE (N/A Back)     Patient location during evaluation: PACU Anesthesia Type: General Level of consciousness: awake and alert Pain management: pain level controlled Vital Signs Assessment: post-procedure vital signs reviewed and stable Respiratory status: spontaneous breathing, nonlabored ventilation, respiratory function stable and patient connected to nasal cannula oxygen Cardiovascular status: blood pressure returned to baseline and stable Postop Assessment: no apparent nausea or vomiting Anesthetic complications: no   No complications documented.  Last Vitals:  Vitals:   09/10/19 1306 09/10/19 1635  BP: (!) 189/89 (!) 157/87  Pulse: 66 89  Resp: 18 16  Temp: 36.5 C 36.4 C  SpO2: 100% 99%    Last Pain:  Vitals:   09/10/19 1635  TempSrc: Oral  PainSc:                  Bina Veenstra DAVID

## 2019-09-11 ENCOUNTER — Other Ambulatory Visit: Payer: Self-pay

## 2019-09-11 LAB — GLUCOSE, CAPILLARY: Glucose-Capillary: 143 mg/dL — ABNORMAL HIGH (ref 70–99)

## 2019-09-11 NOTE — Progress Notes (Signed)
Patient is discharged from room 3C07 at this time. Alert and in stable condition. IV site d/c'd and instructions read to patient with understanding verbalized and all questions answered. Left unit via wheelchair with all belongings at side. 

## 2019-09-11 NOTE — Discharge Summary (Signed)
Physician Discharge Summary  Patient ID: Eddie Mcdaniel MRN: [id number] DOB/AGE: 77-25-1942 77 y.o.  Admit date: 09/10/2019 Discharge date: 09/11/2019  Admission Diagnoses: SPINAL STENOSIS, LUMBAR REGION WITH NEUROGENIC CLAUDICATION and spondylolisthesis, lumbago, radiculopathy L 45 level  Discharge Diagnoses: SPINAL STENOSIS, LUMBAR REGION WITH NEUROGENIC CLAUDICATION and spondylolisthesis, lumbago, radiculopathy L 45 level Active Problems:   Spondylolisthesis of lumbar region   Discharged Condition: good  Hospital Course: 79 year old male who was diagnosed with spinal stenosis with neurogenic claudication, spondylolisthesis, lumbago, and an L4-L5 radiculopathy. It was elected to take him to surgery for decompression and fusion at this level. The patient underwent an uncomplicated surgery and was extubated in the OR and taken to PACU for recovery in stable condition. He was then transferred to [facility] for observation. He worked with PT, OT and the nursing staff and is ambulating well with supervision, pain is well controlled, and feels that he is ready for discharge to home.   Consults: None  Significant Diagnostic Studies: radiology: X-rays Treatments: POSTERIOR LUMBAR INTERBODY FUSION LUMBAR FOUR- LUMBAR FIVE with PEEK cages, autograft, pedicle screw fixation, posterolateral arthrodesis with autograft and allograft  Discharge Exam: Blood pressure (!) 124/59, pulse 86, temperature 98.2 F (36.8 C), temperature source Oral, resp. rate 16, height '5\' 5"'$  (1.651 m), weight 67.7 kg, SpO2 100 %. Physical Exam:  Neurologic: Alert and oriented X 3, normal strength and tone. Normal symmetric reflexes. Normal coordination and gait. GCS 15 Dressing is CDI. Incision is well approximated with no drainage erythema, or edema. He is MAEW with good power that is symmetric bilaterally.  Disposition: Discharge disposition: 01-Home or Self Care        Allergies as of 09/11/2019      Reactions   Ace  Inhibitors Anaphylaxis   angioedema   Contrast Media [iodinated Diagnostic Agents] Itching      Medication List    TAKE these medications   ascorbic acid 500 MG tablet Commonly known as: VITAMIN C Take 500 mg by mouth daily.   aspirin EC 81 MG tablet Take 162 mg by mouth daily. Swallow whole. What changed: Another medication with the same name was changed. Make sure you understand how and when to take each.   aspirin EC 81 MG tablet Take 1 tablet (81 mg total) by mouth daily. What changed: how much to take   atorvastatin 10 MG tablet Commonly known as: LIPITOR Take 10 mg by mouth daily.   blood glucose meter kit and supplies Kit One Touch Meter and Strips supplies for BID testing Dx: E11.9   carvedilol 12.5 MG tablet Commonly known as: COREG Take 12.5 mg by mouth 2 (two) times daily with a meal. What changed: Another medication with the same name was changed. Make sure you understand how and when to take each.   carvedilol 12.5 MG tablet Commonly known as: COREG TAKE 1 TABLET TWICE A DAY  WITH A MEAL What changed: See the new instructions.   CINNAMON PO Take by mouth See admin instructions. Takes "half a spoonful" once a day.   GARLIC PO Take 1 capsule by mouth daily.   glucose blood test strip Commonly known as: OneTouch Verio Use as instructed once a day.  Dx: E11.65.  Need ov/lab   hydrochlorothiazide 25 MG tablet Commonly known as: HYDRODIURIL Take 25 mg by mouth every morning.   IRON PO Take 65 mg by mouth daily.   metFORMIN 750 MG 24 hr tablet Commonly known as: GLUCOPHAGE-XR TAKE 1 TABLET TWICE A DAY  ONE TOUCH LANCETS Misc UAD BID for Dx: E11.9   tamsulosin 0.4 MG Caps capsule Commonly known as: FLOMAX Take 0.4 mg by mouth daily as needed.   Turmeric Powd Take by mouth See admin instructions. Takes "half a spoonful" once a day.        Signed: Marvis Moeller, DNP, NP-C 09/11/2019, 10:30 AM

## 2019-09-11 NOTE — Discharge Instructions (Signed)

## 2019-09-11 NOTE — Progress Notes (Signed)
Subjective: Patient reports that he is doing well, but has some stiffness in his low back with very little pain. He reports that he has been ambulating in the hall multiple times last night and this morning and feels that he has made good progress. He denies any numbness, tingling, or weakness.   Objective: Vital signs in last 24 hours: Temp:  [97 F (36.1 C)-98.2 F (36.8 C)] 98.2 F (36.8 C) (08/13 0750) Pulse Rate:  [62-99] 86 (08/13 0750) Resp:  [11-18] 16 (08/13 0750) BP: (102-189)/(59-89) 124/59 (08/13 0750) SpO2:  [94 %-100 %] 100 % (08/13 0750)  Intake/Output from previous day: 08/12 0701 - 08/13 0700 In: 1700 [P.O.:300; I.V.:1200; IV Piggyback:200] Out: 545 [Urine:395; Blood:150] Intake/Output this shift: No intake/output data recorded.  Neurologic: Alert and oriented X 3, normal strength and tone. Normal symmetric reflexes. Normal coordination and gait  Dressing is CDI. Incision is well approximated with no drainage erythema, or edema. He is MAEW with good power that is symmetric bilaterally.   Lab Results: No results for input(s): WBC, HGB, HCT, PLT in the last 72 hours. BMET No results for input(s): NA, K, CL, CO2, GLUCOSE, BUN, CREATININE, CALCIUM in the last 72 hours.  Studies/Results: DG Lumbar Spine 2-3 Views  Result Date: 09/10/2019 CLINICAL DATA:  PLIF L4-L5 EXAM: LUMBAR SPINE - 2-3 VIEW; DG C-ARM 1-60 MIN COMPARISON:  MR lumbar spine 12/29/2010 FINDINGS: Report presumes presence of 5 lumbar vertebra. Images obtained demonstrate BILATERAL pedicle screws at L4 and L5 with an intervening disc prosthesis at the L4-L5 disc space. Bones demineralized. No fracture or bone destruction. Minimal anterolisthesis L4-L5. IMPRESSION: Intraoperative images during posterior L4-L5 fusion. Electronically Signed   By: Lavonia Dana M.D.   On: 09/10/2019 10:21   DG C-Arm 1-60 Min  Result Date: 09/10/2019 CLINICAL DATA:  PLIF L4-L5 EXAM: LUMBAR SPINE - 2-3 VIEW; DG C-ARM 1-60 MIN  COMPARISON:  MR lumbar spine 12/29/2010 FINDINGS: Report presumes presence of 5 lumbar vertebra. Images obtained demonstrate BILATERAL pedicle screws at L4 and L5 with an intervening disc prosthesis at the L4-L5 disc space. Bones demineralized. No fracture or bone destruction. Minimal anterolisthesis L4-L5. IMPRESSION: Intraoperative images during posterior L4-L5 fusion. Electronically Signed   By: Lavonia Dana M.D.   On: 09/10/2019 10:21    Assessment/Plan: 79 year old male who was diagnosed with spinal stenosis with neurogenic claudication, spondylolisthesis, lumbago, and an L4-L5 radiculopathy. It was elected to take him to surgery for decompression and fusion at this level. The patient underwent an uncomplicated surgery and is recovering well. He reports a resolution of his preoperative symptoms and is only experiencing mild low back stiffness. He is ambulating well with the assistance of PT and feels that he will be ready to be discharged this morning. PT recommends supervision for mobility/OOB but no follow up is needed. OT does not recommend any follow up.    LOS: 1 day     Marvis Moeller 09/11/2019, 10:17 AM

## 2019-09-11 NOTE — Progress Notes (Signed)
Physical Therapy Treatment Patient Details Name: CHARLENE COWDREY MRN: [id number] DOB: 17-Dec-1940 Today's Date: 09/11/2019    History of Present Illness The pt is a 79 yo male presenting s/p PLIF L4-5 due to ongoing pain and tingling into RLE. PMH includes: HTN, CAD, sleep apnea, DM, GERD, and hospitalization for 20 days in 2020.    PT Comments    Pt progressing well with post-op mobility. He was able to demonstrate transfers and ambulation with gross modified independence to supervision for safety without an AD. Pt was educated on precautions, brace application/wearing schedule, appropriate activity progression, and car transfer. Will continue to follow.      Follow Up Recommendations  No PT follow up;Supervision for mobility/OOB     Equipment Recommendations  None recommended by PT    Recommendations for Other Services       Precautions / Restrictions Precautions Precautions: Fall;Back Precaution Booklet Issued: Yes (comment) Precaution Comments: pt able to verbalize precautions at end of session Required Braces or Orthoses: Spinal Brace Spinal Brace: Lumbar corset;Applied in sitting position Restrictions Weight Bearing Restrictions: No    Mobility  Bed Mobility Overal bed mobility: Needs Assistance             General bed mobility comments: Pt was received standing in the room without the brace donned. VC's for need to wear brace anytime up out of bed.   Transfers Overall transfer level: Modified independent Equipment used: None Transfers: Sit to/from Stand Sit to Stand: Modified independent (Device/Increase time)         General transfer comment: No assist required. Pt steady and without difficulty to transition sit<>stand  Ambulation/Gait Ambulation/Gait assistance: Modified independent (Device/Increase time) Gait Distance (Feet): 400 Feet Assistive device: None Gait Pattern/deviations: Step-through pattern;Decreased stride length;Decreased step  length - left Gait velocity: Decreased Gait velocity interpretation: <1.31 ft/sec, indicative of household ambulator General Gait Details: Noted pt with poor control of heel strike during gait cycle. Feet with hard land on the floor each time. Overall steady without assistance or overt LOB   Stairs Stairs: Yes Stairs assistance: Supervision Stair Management: One rail Right;Step to pattern;Forwards Number of Stairs: 1 (x3) General stair comments: Reviewed stair training and pt completed well.    Wheelchair Mobility    Modified Rankin (Stroke Patients Only)       Balance Overall balance assessment: Needs assistance Sitting-balance support: No upper extremity supported;Feet supported Sitting balance-Leahy Scale: Good     Standing balance support: No upper extremity supported;During functional activity Standing balance-Leahy Scale: Fair                              Cognition Arousal/Alertness: Awake/alert Behavior During Therapy: WFL for tasks assessed/performed Overall Cognitive Status: Impaired/Different from baseline Area of Impairment: Safety/judgement;Memory                     Memory: Decreased recall of precautions   Safety/Judgement: Decreased awareness of safety     General Comments: Required cues to maintain precautions and for safety.       Exercises      General Comments General comments (skin integrity, edema, etc.): Patient able to don lumbar corset in standing with adherence to back precautions.       Pertinent Vitals/Pain Pain Assessment: No/denies pain Faces Pain Scale: Hurts a little bit Pain Location: low back with bed mobility and sit <> stand transfers Pain Descriptors / Indicators: Grimacing Pain Intervention(s): Premedicated  before session;Monitored during session    [place] expects to be discharged to:: Private residence Living Arrangements: Spouse/significant other Available Help at Discharge:  Family;Available 24 hours/day Type of Home: House Home Access: Stairs to enter Entrance Stairs-Rails: None Home Layout: One level Home Equipment: Walker - 4 wheels;Grab bars - tub/shower      Prior Function Level of Independence: Independent      Comments: pt exercising multiple days/wk until reccently due to progression of numbness and tingling throughout BLE. Independent with ADLs. Reports his exercise included walking 3 miles/day   PT Goals (current goals can now be found in the care plan section) Acute Rehab PT Goals Patient Stated Goal: To return home PT Goal Formulation: With patient Time For Goal Achievement: 09/24/19 Potential to Achieve Goals: Good Progress towards PT goals: Progressing toward goals    Frequency    Min 5X/week      PT Plan Current plan remains appropriate    Co-evaluation              AM-PAC PT "6 Clicks" Mobility   Outcome Measure  Help needed turning from your back to your side while in a flat bed without using bedrails?: None Help needed moving from lying on your back to sitting on the side of a flat bed without using bedrails?: None Help needed moving to and from a bed to a chair (including a wheelchair)?: None Help needed standing up from a chair using your arms (e.g., wheelchair or bedside chair)?: None Help needed to walk in hospital room?: None Help needed climbing 3-5 steps with a railing? : A Little 6 Click Score: 23    End of Session Equipment Utilized During Treatment: Gait belt;Back brace Activity Tolerance: Patient tolerated treatment well Patient left: in bed;with call bell/phone within reach (pt sitting EOB) Nurse Communication: Mobility status PT Visit Diagnosis: Difficulty in walking, not elsewhere classified (R26.2);Pain Pain - part of body:  (low back)     Time: 8366-2947 PT Time Calculation (min) (ACUTE ONLY): 12 min  Charges:  $Gait Training: 8-22 mins                     Rolinda Roan, PT, DPT Acute  Rehabilitation Services Pager: [phone] Office: [phone]    Thelma Comp 09/11/2019, 10:47 AM

## 2019-09-11 NOTE — Evaluation (Signed)
Occupational Therapy Evaluation Patient Details Name: Eddie Mcdaniel MRN: [id number] DOB: May 22, 1940 Today's Date: 09/11/2019    History of Present Illness The pt is a 79 yo male presenting s/p PLIF L4-5 due to ongoing pain and tingling into RLE. PMH includes: HTN, CAD, sleep apnea, DM, GERD, and hospitalization for 20 days in 2020.   Clinical Impression   Patient presenting for OT evaluation secondary to dx. above. PTA, patient was living with family in a private residence and was independent with BADLs/IADLs. With onset and worsening of pain, patient required assistance with IADLs and use of rollator vs. furniture walks for household mobility. Patient currently presents near baseline level of function demonstrating Mod I to supervision A for functional transfers, mobility, and UB BADLs. Patient requiring Min A to don footwear seated EOB 2/2 lumbar corset. Patient does not require continued acute occupational therapy services with OT to sign off at this time. Recommendation for return home with assist from family as needed.     Follow Up Recommendations  No OT follow up    Equipment Recommendations  None recommended by OT    Recommendations for Other Services       Precautions / Restrictions Precautions Precautions: Fall;Back Precaution Booklet Issued: Yes (comment) Precaution Comments: pt able to verbalize precautions at end of session Required Braces or Orthoses: Spinal Brace Spinal Brace: Lumbar corset;Applied in sitting position Restrictions Weight Bearing Restrictions: No      Mobility Bed Mobility Overal bed mobility: Needs Assistance             General bed mobility comments: Patient seated EOB upon entry.   Transfers Overall transfer level: Needs assistance Equipment used: None Transfers: Sit to/from Stand Sit to Stand: Modified independent (Device/Increase time)              Balance Overall balance assessment: Needs assistance   Sitting  balance-Leahy Scale: Good       Standing balance-Leahy Scale: Fair                             ADL either performed or assessed with clinical judgement   ADL Overall ADL's : Needs assistance/impaired     Grooming: Independent;Wash/dry face;Wash/dry hands;Oral care;Standing           Upper Body Dressing : Modified independent;Sitting Upper Body Dressing Details (indicate cue type and reason): To don shirt seated EOB Lower Body Dressing: Minimal assistance;Sitting/lateral leans;Sit to/from stand Lower Body Dressing Details (indicate cue type and reason): Cues for adherence to back precautions and Min A to don footwear.   Toilet Transfer: Modified Programmer, applications Details (indicate cue type and reason): Increased time/effort Toileting- Clothing Manipulation and Hygiene: Modified independent;Sit to/from stand;Sitting/lateral lean Toileting - Clothing Manipulation Details (indicate cue type and reason): Increased time/effort 2/2 pain.              Vision   Vision Assessment?: No apparent visual deficits     Perception     Praxis      Pertinent Vitals/Pain Pain Assessment: Faces Faces Pain Scale: Hurts a little bit Pain Location: low back with bed mobility and sit <> stand transfers Pain Descriptors / Indicators: Grimacing Pain Intervention(s): Premedicated before session;Monitored during session     Hand Dominance Right   Extremity/Trunk Assessment Upper Extremity Assessment Upper Extremity Assessment: Overall WFL for tasks assessed   Lower Extremity Assessment Lower Extremity Assessment: Overall WFL for tasks assessed   Cervical / Trunk  Assessment Cervical / Trunk Assessment: Normal   Communication Communication Communication: HOH   Cognition Arousal/Alertness: Awake/alert Behavior During Therapy: WFL for tasks assessed/performed Overall Cognitive Status: Impaired/Different from baseline Area of Impairment: Safety/judgement                          Safety/Judgement: Decreased awareness of safety     General Comments: Patient attempting to don socks in standing against the wall despite education on donning LB clothing/footwear in sitting.    General Comments  Patient able to don lumbar corset in standing with adherence to back precautions.     Exercises     Shoulder Instructions      Home Living Family/patient expects to be discharged to:: Private residence Living Arrangements: Spouse/significant other Available Help at Discharge: Family;Available 24 hours/day Type of Home: House Home Access: Stairs to enter [employer] of Steps: 3 Entrance Stairs-Rails: None Home Layout: One level     Bathroom Shower/Tub: Teacher, early years/pre: Standard     Home Equipment: Environmental consultant - 4 wheels;Grab bars - tub/shower          Prior Functioning/Environment Level of Independence: Independent        Comments: pt exercising multiple days/wk until reccently due to progression of numbness and tingling throughout BLE. Independent with ADLs. Reports his exercise included walking 3 miles/day        OT Problem List:        OT Treatment/Interventions:      OT Goals(Current goals can be found in the care plan section) Acute Rehab OT Goals Patient Stated Goal: To return home  OT Frequency:     Barriers to D/C:            Co-evaluation              AM-PAC OT "6 Clicks" Daily Activity     Outcome Measure Help from another person eating meals?: None Help from another person taking care of personal grooming?: None Help from another person toileting, which includes using toliet, bedpan, or urinal?: None Help from another person bathing (including washing, rinsing, drying)?: A Little Help from another person to put on and taking off regular upper body clothing?: None Help from another person to put on and taking off regular lower body clothing?: A Little 6 Click Score: 22    End of Session    Activity Tolerance: Patient tolerated treatment well Patient left: Other (comment) (Seated EOB in prep for PT treatment session. RN aware )  OT Visit Diagnosis: Muscle weakness (generalized) (M62.81)                Time: 9381-8299 OT Time Calculation (min): 12 min Charges:  OT General Charges $OT Visit: 1 Visit OT Evaluation $OT Eval Low Complexity: 1 Low  Shariece Viveiros H. OTR/L Supplemental OT, Department of rehab services [phone]  Styles Fambro R H. 09/11/2019, 9:47 AM

## 2019-09-11 NOTE — Patient Outreach (Signed)
  [facility]) Care Management Chronic Special Needs Program    09/11/2019  Name: Eddie Mcdaniel, DOB: Jan 11, 1941  MRN: [id number]   [name] is enrolled in a chronic special needs plan for Diabetes. Client admitted on 09/10/19 to [facility] with dx of SPINAL STENOSIS, LUMBAR REGION WITH NEUROGENIC CLAUDICATION and POSTERIOR LUMBAR INTERBODY FUSION LUMBAR FOUR- LUMBAR FIVE  Individualized care plan sent to [facility] hospital for admission  Assigned RNCM will continue to follow post discharge . Peter Garter RN, Jackquline Denmark, CDE Chronic Care Management Coordinator [facility] Care Management [phone]

## 2019-09-14 ENCOUNTER — Other Ambulatory Visit: Payer: Self-pay | Admitting: *Deleted

## 2019-09-14 NOTE — Patient Outreach (Signed)
  [place] [facility]) Care Management Chronic Special Needs Program  09/14/2019  Name: ELYA DILORETO DOB: [birth year]  MRN: [id number]  Mr. Savaughn Karwowski is enrolled in a chronic special needs plan for Diabetes. Reviewed and updated care plan.  Subjective: Client admitted to [facility] on 09/10/19 and discharged 09/11/19 with diagnosis- Elective procedure: Spinal stenosis, lumbar region with neurogenic claudication and posterior lumbar interbody fusion lumbar 4 and 5. Client will receive EMMI general discharge telephone calls for one month.   Goals Addressed            This Visit's Progress   . General - Client will not be readmitted within 30 days (C-SNP)       Please follow discharge instructions and call health care provider if you have any questions. Attend all follow up appointments as scheduled. Take medications as prescribed. Call 24 hour nurse advice line as needed at [phone]       Plan:    RN care manager faxed today's note and updated individualized care plan to primary care provider, mailed updated individualized care plan to client.  Chronic care management coordinator will outreach in:  One month for transition of care follow up.   [place] Case Manager, C-SNP  [phone]  .

## 2019-10-15 ENCOUNTER — Other Ambulatory Visit: Payer: Self-pay | Admitting: *Deleted

## 2019-10-15 NOTE — Patient Outreach (Signed)
  [facility]) Care Management Chronic Special Needs Program    10/15/2019  Name: Eddie Mcdaniel, DOB: [birth year]  MRN: [id number]   [name] is enrolled in a chronic special needs plan for Diabetes.  Outreach call to client for one month post hospital follow up transition of care, spoke with client who reports he is getting ready to get in shower and this is not a good day to talk and requests call back another day.  PLAN Outreach client within 1-2 weeks  Jacqlyn Larsen [facility], BSN [place], [place]

## 2019-10-22 ENCOUNTER — Encounter: Payer: Self-pay | Admitting: *Deleted

## 2019-10-22 ENCOUNTER — Other Ambulatory Visit: Payer: Self-pay | Admitting: *Deleted

## 2019-10-22 NOTE — Patient Outreach (Signed)
[facility]) Care Management Chronic Special Needs Program  10/22/2019  Name: Eddie Mcdaniel DOB: [birth year]  MRN: [id number]  [name] is enrolled in a chronic special needs plan for Diabetes. Reviewed and updated care plan.  Subjective: Spoke with client for one month follow up transition of care, client reports he is doing well, feels better since having his back surgery, has seen neurosurgeon and is to follow up again 11/25/19.  Client reports CBG today 114, has all medications and taking as prescribed.  Client continues checking blood pressure daily.  Client's spouse assists with anything client may need.  No concerns or problems verbalized.   Goals    .  "to have my back surgery to help with tingling in leg and foot" (pt-stated)      Continue to follow up with your doctor/ neurosurgeon Call HTA conciegre for any benefit questions at [phone] Client had surgery and states he does feel better, continues to recuperate     .  Client understands the importance of follow-up with providers by attending scheduled visits      Review of medical record indicates client has completed 7 visits with primary care provider in 2020. Continue to keep your scheduled appointments with your providers.     .  Client will verbalize knowledge of chronic lung disease as evidenced by no ED visits or Inpatient stays related to chronic lung disease       Review [employer] calendar (in the back section) sent in mail for COPD information and action plan. Read the color COPD color zones and know if you are in the "red, yellow or green zones" and be prepared to discuss with RN on each call. Call your provider if you feel you are getting in the "yellow" zone. Call 911 if you are in the "red" zone, having severe shortness of breath, get to a hospital immediately! Know when and how to use rescue inhaler and daily inhalers, along with other medication. Contact provider  for any signs and symptoms of mild shortness of breath. Increase exercise only if you are able to do it.  Follow doctor recommendations. Use the 24 hour nurse advice line at [phone] as needed     .  Client will verbalize knowledge of self management of Hypertension as evidences by BP reading of 140/90 or less; or as defined by provider      Plan to check blood pressure regularly.  If you do not have a B/P monitor (cuff), one can be provided to you.  Write results in your [employer] calendar (in the back section). Reviewed blood pressure medication from EMR. Take B/P medications as ordered.  Some may cause you to use the bathroom more. Plan to eat low salt and heart healthy meals full of fruits, vegetables, whole grains, lean protein and limit fat and sugars. Increase activity as tolerated. Reviewed lifestyle modification- smoking cessation, weight control and reducing stress.       Marland Kitchen  HEMOGLOBIN A1C < 7      Per medical record review Hgb AIC completed on 01/06/19   6.9 Diabetes self management actions as follows- Continue to keep your follow up appointments with your provider and have lab work completed as recommended. Monitor glucose (blood sugar) per health care provider recommendation. Check feet daily Visit health care provider every 3-6 months as directed. It is important to have your Hgb AIC checked every 3-6 months (every 6 months if you are at goal  and every 3 months if you are not at goal). Eye exam yearly Carbohydrate controlled meal planning Take diabetes medications as prescribed by health care provider Physical activity       .  Maintain timely refills of diabetic medication as prescribed within the year .      Take medications as prescribed. Follow up with your health care provider if you have any questions. Please contact your assigned RN care manager if you have difficulty obtaining medications.    .  Obtain annual  Lipid Profile, LDL-C      Per  medical record review, Lipid profile completed on 06/03/18 LDL= 58 The goal for LDL is less than 70mg /dl as you are at high risk for complications. Try to avoid saturated fats, trans-fats and eat more fiber. Continue to follow up with your health care provider for any needed lab work.     .  Obtain Annual Eye (retinal)  Exam       Per medical record review, client had eye exam on 07/17/18 Plan to have dilated eye exam every year Plan to schedule your eye exam yearly     .  Obtain Annual Foot Exam      Client states provider checks feet at every visit Per client report, foot exam completed on 06/04/19 Your doctor should check your feet at least once a year. Plan to schedule a foot exam with your health care provider once every year. Check your skin and feet daily for cuts, bruises, redness, blisters or sore.     .  Obtain annual screen for micro albuminuria (urine) , nephropathy (kidney problems)      Per medical record review, microalbuminuria completed on 06/03/18 Continue to obtain yearly physicals and lab checks as recommended by your health care provider. It is important for your health care provider to check your urine for protein at least once every year.      Illa Level Hemoglobin A1C at least 2 times per year      Review of medical records indications last A1C 7.4 on 10/29/2018 and 6.9 on 01/06/19  Continue to keep all scheduled appointments with your provider and lab work as recommended      .  Visit Primary Care Provider or Endocrinologist at least 2 times per year       Review of medical record indicates client has completed 7 visits with primary care provider in 2020. Continue to keep your scheduled appointments with your providers.          Plan:    RN care manager faxed today's note with updated individualized care plan to primary care provider, mailed updated individualized care plan to client.  Chronic care management coordinator will outreach in:  9-12  months   Kassie Mends Nursing/RN [place] Case Manager, C-SNP  [phone]  .

## 2019-11-09 ENCOUNTER — Ambulatory Visit: Payer: HMO | Admitting: Physician Assistant

## 2019-12-03 ENCOUNTER — Other Ambulatory Visit: Payer: HMO

## 2019-12-04 ENCOUNTER — Other Ambulatory Visit: Payer: Self-pay | Admitting: *Deleted

## 2019-12-04 NOTE — Patient Outreach (Signed)
  [facility]) Care Management Chronic Special Needs Program    12/04/2019  Name: Eddie Mcdaniel, DOB: [birth year]  MRN: [id number]   Mr. Arther Heisler is enrolled in a chronic special needs plan for Diabetes.  [employer] care management team has assumed care and services for this member.  Case closed by [facility] care management.  Case Closed  Jacqlyn Larsen [facility], BSN [place], [place]

## 2019-12-14 ENCOUNTER — Ambulatory Visit (HOSPITAL_BASED_OUTPATIENT_CLINIC_OR_DEPARTMENT_OTHER)
Admission: RE | Admit: 2019-12-14 | Discharge: 2019-12-14 | Disposition: A | Discharge status: Home / Self Care | Payer: HMO | Source: Ambulatory Visit | Attending: Pulmonary Disease | Admitting: Pulmonary Disease

## 2019-12-14 ENCOUNTER — Other Ambulatory Visit: Payer: Self-pay

## 2019-12-14 DIAGNOSIS — R0602 Shortness of breath: Secondary | ICD-10-CM | POA: Insufficient documentation

## 2019-12-21 ENCOUNTER — Ambulatory Visit: Payer: HMO | Admitting: Pulmonary Disease

## 2019-12-23 ENCOUNTER — Other Ambulatory Visit: Payer: Self-pay

## 2019-12-23 ENCOUNTER — Ambulatory Visit: Payer: HMO | Admitting: Pulmonary Disease

## 2019-12-23 ENCOUNTER — Encounter: Payer: Self-pay | Admitting: Pulmonary Disease

## 2019-12-23 VITALS — BP 138/80 | HR 60 | Temp 97.3°F | Ht 65.0 in | Wt 150.0 lb

## 2019-12-23 DIAGNOSIS — R0602 Shortness of breath: Secondary | ICD-10-CM | POA: Diagnosis not present

## 2019-12-23 DIAGNOSIS — U099 Post covid-19 condition, unspecified: Secondary | ICD-10-CM

## 2019-12-23 NOTE — Patient Instructions (Signed)
We will get a high-resolution CT and pulmonary function test in 1 year.  Follow-up in clinic after that.

## 2019-12-23 NOTE — Progress Notes (Signed)
Eddie Mcdaniel    [id number]    Feb 05, 1940  Primary Care Physician:Smith, Hal Hope, MD  Referring Physician: Carol Ada, [place],  [place] [address]  Chief complaint: Follow-up for post Covid pneumonia  HPI: 79 year old with hypertension, hyperlipidemia, diabetes, coronary artery disease, GERD.  Admitted from 9/16-10/6 at [facility] for COVID-19 pneumonia with prolonged hypoxic respiratory failure Treated with remdesivir, convalescent plasma.  He got initially 10 days of steroids but due to persistent dyspnea and hypoxia he was given a longer course of steroids.  However he developed hypoglycemia while on steroids.  Discharged on prednisone 10 mg a day which he continued until 12/04/2018  He was on supplemental oxygen during the day which he has stopped.  He is still using 1 to 2 L at night  Pets: No pets Occupation: Retired Clinical biochemist Exposures: No known exposures.  No mold, hot tub, Jacuzzi, down pillows or comforters Smoking history: Never smoker Travel history: No significant travel history Relevant family history: No significant family history of lung disease  Interim history: Underwent back surgery for sciatica recently.  The procedure went well with no respiratory issues He has improved mobility now and is starting to exercise  States that breathing is doing well  Outpatient Encounter Medications as of 12/23/2019  Medication Sig  . ascorbic acid (VITAMIN C) 500 MG tablet Take 500 mg by mouth daily.  Marland Kitchen aspirin EC 81 MG tablet Take 162 mg by mouth daily. Swallow whole.  Marland Kitchen atorvastatin (LIPITOR) 10 MG tablet Take 10 mg by mouth daily.  . blood glucose meter kit and supplies KIT One Touch Meter and Strips supplies for BID testing Dx: E11.9  . carvedilol (COREG) 12.5 MG tablet TAKE 1 TABLET TWICE A DAY  WITH A MEAL (Patient taking differently: Take 12.5 mg by mouth 2 (two) times daily with a meal. )  . carvedilol (COREG) 12.5  MG tablet Take 12.5 mg by mouth 2 (two) times daily with a meal.   . CINNAMON PO Take by mouth See admin instructions. Takes "half a spoonful" once a day.  . Ferrous Sulfate (IRON PO) Take 65 mg by mouth daily.  Marland Kitchen GARLIC PO Take 1 capsule by mouth daily.  Marland Kitchen glucose blood (ONETOUCH VERIO) test strip Use as instructed once a day.  Dx: E11.65.  Need ov/lab  . hydrochlorothiazide (HYDRODIURIL) 25 MG tablet Take 25 mg by mouth every morning.   . metFORMIN (GLUCOPHAGE-XR) 750 MG 24 hr tablet TAKE 1 TABLET TWICE A DAY (Patient taking differently: Take 750 mg by mouth 2 (two) times daily. )  . ONE TOUCH LANCETS MISC UAD BID for Dx: E11.9  . tamsulosin (FLOMAX) 0.4 MG CAPS capsule Take 0.4 mg by mouth daily as needed.  . Turmeric POWD Take by mouth See admin instructions. Takes "half a spoonful" once a day.  . [DISCONTINUED] aspirin EC 81 MG tablet Take 1 tablet (81 mg total) by mouth daily.   No facility-administered encounter medications on file as of 12/23/2019.   Physical Exam: Blood pressure 138/80, pulse 60, temperature (!) 97.3 F (36.3 C), temperature source Skin, height $RemoveBefo'5\' 5"'XeTIoibwHZc$  (1.651 m), weight 150 lb (68 kg), SpO2 100 %. Gen:      No acute distress HEENT:  EOMI, sclera anicteric Neck:     No masses; no thyromegaly Lungs:    Clear to auscultation bilaterally; normal respiratory effort CV:         Regular rate  and rhythm; no murmurs Abd:      + bowel sounds; soft, non-tender; no palpable masses, no distension Ext:    No edema; adequate peripheral perfusion Skin:      Warm and dry; no rash Neuro: alert and oriented x 3 Psych: normal mood and affect  Data Reviewed: Imaging: CT chest 10/20/2018-extensive bilateral groundglass opacities.  Predominantly in the lower lobes. Chest x-ray 10/31/2018-increasing bilateral opacities. 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  No honeycombing. 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 High-res CT 12/14/2019-stable post COVID pulmonary fibrosis I have reviewed the images personally.  PFTs  02/04/2019 FVC 1.60, FEV1 1.41, TLC 4.21 [69%], DLCO 13.26 [62%] Suggestion of moderate restriction with diffusion impairment.  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  If stable then we can stop following at that point   Plan/Recommendations: - High-res CT, PFTs in 1 year  Marshell Garfinkel MD [place] [facility] 12/23/2019, 9:07 AM  CC: Carol Ada, MD

## 2019-12-23 NOTE — Addendum Note (Signed)
Addended by: Elton Sin on: 12/23/2019 09:30 AM   Modules accepted: Orders

## 2020-01-08 ENCOUNTER — Ambulatory Visit: Payer: HMO | Admitting: Pulmonary Disease

## 2020-05-05 ENCOUNTER — Ambulatory Visit: Payer: HMO | Admitting: *Deleted

## 2020-07-20 ENCOUNTER — Telehealth: Payer: Self-pay | Admitting: Pulmonary Disease

## 2020-07-20 DIAGNOSIS — U099 Post covid-19 condition, unspecified: Secondary | ICD-10-CM

## 2020-07-20 NOTE — Telephone Encounter (Signed)
Called and spoke with patient. States he is moving to [place] in a month and would like to know if he can go and have his CT scan early? His last scan was 12/14/19  Dr. Vaughan Browner please advise.

## 2020-07-21 ENCOUNTER — Telehealth: Payer: Self-pay | Admitting: Pulmonary Disease

## 2020-07-21 ENCOUNTER — Other Ambulatory Visit: Payer: Self-pay

## 2020-07-21 ENCOUNTER — Ambulatory Visit (HOSPITAL_BASED_OUTPATIENT_CLINIC_OR_DEPARTMENT_OTHER)
Admission: RE | Admit: 2020-07-21 | Discharge: 2020-07-21 | Disposition: A | Discharge status: Home / Self Care | Payer: HMO | Source: Ambulatory Visit | Attending: Pulmonary Disease | Admitting: Pulmonary Disease

## 2020-07-21 DIAGNOSIS — J8410 Pulmonary fibrosis, unspecified: Secondary | ICD-10-CM | POA: Diagnosis not present

## 2020-07-21 DIAGNOSIS — J9690 Respiratory failure, unspecified, unspecified whether with hypoxia or hypercapnia: Secondary | ICD-10-CM | POA: Diagnosis present

## 2020-07-21 DIAGNOSIS — U099 Post covid-19 condition, unspecified: Secondary | ICD-10-CM | POA: Diagnosis not present

## 2020-07-21 NOTE — Telephone Encounter (Signed)
Yes that is fine Please order high-resolution CT at next available

## 2020-07-21 NOTE — Telephone Encounter (Signed)
CTHR has been ordered. Patient is aware and voiced his understanding.  Nothing further needed at this time.

## 2020-07-21 NOTE — Telephone Encounter (Signed)
Appt scheduled for 07/26/2020 at 9:00 to discuss CT results.  Patient is aware and voiced his understanding.  Nothing further is needed at this time.

## 2020-07-26 ENCOUNTER — Other Ambulatory Visit: Payer: Self-pay

## 2020-07-26 ENCOUNTER — Encounter: Payer: Self-pay | Admitting: Pulmonary Disease

## 2020-07-26 ENCOUNTER — Ambulatory Visit: Payer: HMO | Admitting: Pulmonary Disease

## 2020-07-26 VITALS — BP 130/70 | HR 60 | Temp 98.2°F | Ht 65.0 in | Wt 152.6 lb

## 2020-07-26 DIAGNOSIS — U099 Post covid-19 condition, unspecified: Secondary | ICD-10-CM

## 2020-07-26 NOTE — Progress Notes (Signed)
Eddie Mcdaniel    [id number]    [birth year]  Primary Care Physician:Smith, Hal Hope, MD  Referring Physician: Carol Ada, [place],  [place] [id number]  Chief complaint: Follow-up for post Covid pneumonia  HPI: 80 year old with hypertension, hyperlipidemia, diabetes, coronary artery disease, GERD.  Admitted from 9/16-10/6 at [facility] for COVID-19 pneumonia with prolonged hypoxic respiratory failure Treated with remdesivir, convalescent plasma.  He got initially 10 days of steroids but due to persistent dyspnea and hypoxia he was given a longer course of steroids.  However he developed hypoglycemia while on steroids.  Discharged on prednisone 10 mg a day which he continued until 12/04/2018  He was on supplemental oxygen during the day and night which was stopped due to improvement in oxygenation Underwent back surgery for sciatica on 2021.  The procedure went well with no respiratory issues He has improved mobility now and is starting to exercise  Pets: No pets Occupation: Retired Clinical biochemist Exposures: No known exposures.  No mold, hot tub, Jacuzzi, down pillows or comforters Smoking history: Never smoker Travel history: No significant travel history Relevant family history: No significant family history of lung disease  Interim history: States that breathing is doing well with no issues Here for review of CT scan  Outpatient Encounter Medications as of 07/26/2020  Medication Sig   ascorbic acid (VITAMIN C) 500 MG tablet Take 500 mg by mouth daily.   aspirin EC 81 MG tablet Take 81 mg by mouth daily. Swallow whole.   atorvastatin (LIPITOR) 10 MG tablet Take 10 mg by mouth daily.   blood glucose meter kit and supplies KIT One Touch Meter and Strips supplies for BID testing Dx: E11.9   carvedilol (COREG) 12.5 MG tablet Take 12.5 mg by mouth 2 (two) times daily with a meal.    CINNAMON PO Take by mouth See admin instructions. Takes  "half a spoonful" once a day.   Ferrous Sulfate (IRON PO) Take 65 mg by mouth daily.   GARLIC PO Take 1 capsule by mouth daily.   glucose blood (ONETOUCH VERIO) test strip Use as instructed once a day.  Dx: E11.65.  Need ov/lab   hydrochlorothiazide (HYDRODIURIL) 25 MG tablet Take 25 mg by mouth every morning.    metFORMIN (GLUCOPHAGE-XR) 750 MG 24 hr tablet TAKE 1 TABLET TWICE A DAY (Patient taking differently: Take 750 mg by mouth 2 (two) times daily.)   ONE TOUCH LANCETS MISC UAD BID for Dx: E11.9   tamsulosin (FLOMAX) 0.4 MG CAPS capsule Take 0.4 mg by mouth daily as needed.   Turmeric POWD Take by mouth See admin instructions. Takes "half a spoonful" once a day.   [DISCONTINUED] carvedilol (COREG) 12.5 MG tablet TAKE 1 TABLET TWICE A DAY  WITH A MEAL (Patient taking differently: Take 12.5 mg by mouth 2 (two) times daily with a meal.)   No facility-administered encounter medications on file as of 07/26/2020.   Physical Exam: Blood pressure 130/70, pulse 60, temperature 98.2 F (36.8 C), temperature source Oral, height _0  (1.651 m), weight 152 lb 9.6 oz (69.2 kg), SpO2 99 %. Gen:      No acute distress HEENT:  EOMI, sclera anicteric Neck:     No masses; no thyromegaly Lungs:    Clear to auscultation bilaterally; normal respiratory effort CV:         Regular rate and rhythm; no murmurs Abd:      + bowel sounds;  soft, non-tender; no palpable masses, no distension Ext:    No edema; adequate peripheral perfusion Skin:      Warm and dry; no rash Neuro: alert and oriented x 3 Psych: normal mood and affect   Data Reviewed: Imaging: CT chest 10/20/2018-extensive bilateral groundglass opacities.  Predominantly in the lower lobes. Chest x-ray 10/31/2018-increasing bilateral opacities. 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  No honeycombing. High-resolution CT 06/10/19-significant improvement in post Covid pulmonary  fibrosis. High-res CT 12/14/2019-stable post COVID pulmonary fibrosis High-resolution CT 07/21/2020-stable minimal post-COVID pulmonary fibrosis I have reviewed the images personally.  PFTs  02/04/2019 FVC 1.60, FEV1 1.41, TLC 4.21 [69%], DLCO 13.26 [62%] Suggestion of moderate restriction with diffusion impairment.  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  I have reviewed the follow-up high-res CT with him and his wife today  Has maintained an active lifestyle with improvement in mobility after back surgery He is moving to [place] to be closer to his son and daughter.  He does not need further follow-up in pulmonary clinic  Return as needed  Plan/Recommendations: -Return to clinic as needed  Marshell Garfinkel MD [place] [facility] 07/26/2020, 8:56 AM  CC: Carol Ada, MD

## 2020-07-26 NOTE — Patient Instructions (Signed)
I have reviewed your CT scan which shows very good improvement in inflammation from COVID-19 Your lungs are almost back to normal.  There is minimal scarring left which is not of concern  Continue on your exercise program and stay active Since you are moving to [place] you can follow-up in our pulmonary clinic as needed.

## 2021-04-28 IMAGING — DX DG CHEST 1V PORT
1 series · 1 of 1 positions shown · non-contrast
Comparison: Radiograph October 17, 2018. CT scan October 20, 2018.

CLINICAL DATA: Hypoxia.

EXAM:
PORTABLE CHEST 1 VIEW

[chest]
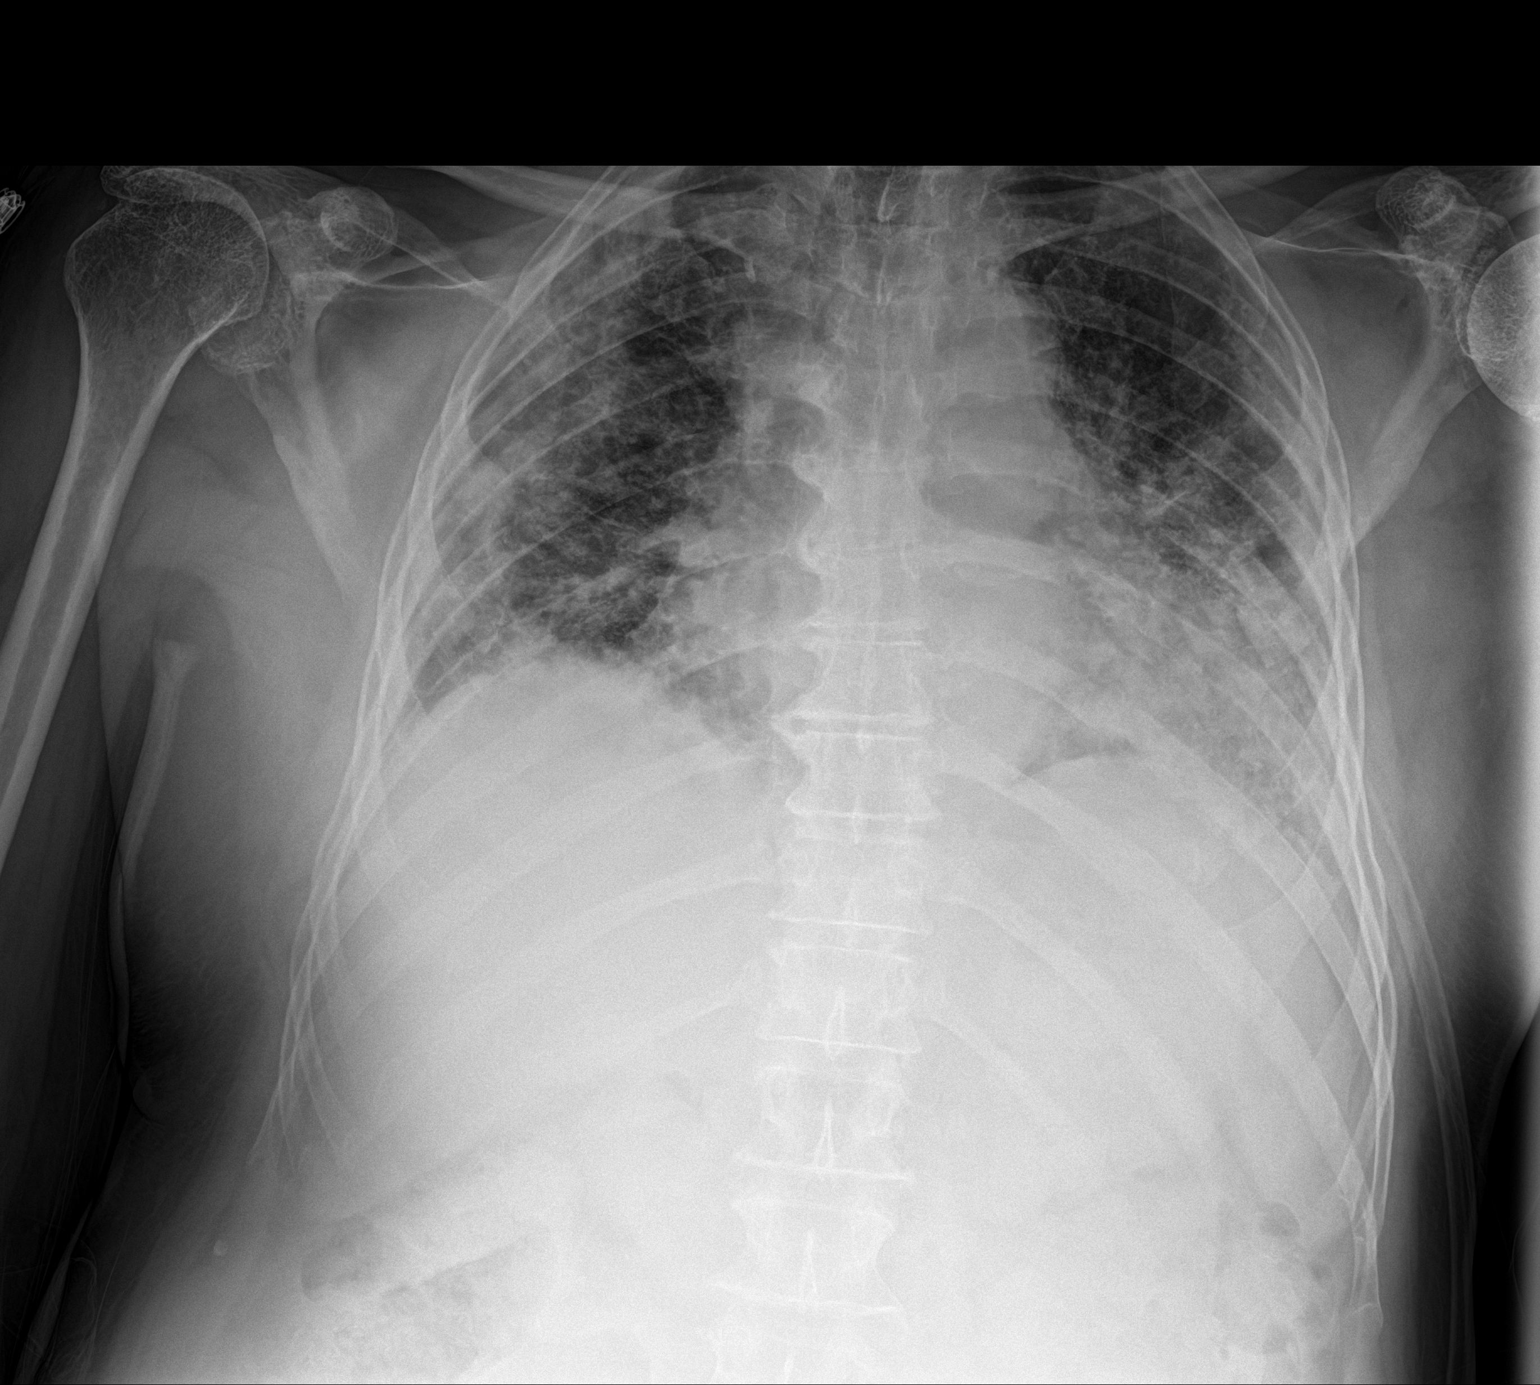

[1 of 1 positions shown; findings below may reference images not displayed]

FINDINGS: Stable cardiomediastinal silhouette. Increased bilateral lung
opacities are noted concerning for worsening multifocal pneumonia.
No definite pneumothorax or pleural effusion is noted. Bony thorax
is unremarkable.
IMPRESSION: Increased bilateral lung opacities are noted concerning for
worsening multifocal pneumonia.
